# Patient Record
Sex: Female | Born: 1965 | Race: White | Hispanic: No | Marital: Married | Smoking: Former smoker
Health system: Southern US, Community
[De-identification: ages and names within clinical notes are randomized; demographics above are authoritative.]

## PROBLEM LIST (undated history)

## (undated) DIAGNOSIS — G43909 Migraine, unspecified, not intractable, without status migrainosus: Secondary | ICD-10-CM

## (undated) DIAGNOSIS — F329 Major depressive disorder, single episode, unspecified: Secondary | ICD-10-CM

## (undated) DIAGNOSIS — M519 Unspecified thoracic, thoracolumbar and lumbosacral intervertebral disc disorder: Secondary | ICD-10-CM

## (undated) DIAGNOSIS — I2510 Atherosclerotic heart disease of native coronary artery without angina pectoris: Secondary | ICD-10-CM

## (undated) DIAGNOSIS — K219 Gastro-esophageal reflux disease without esophagitis: Secondary | ICD-10-CM

## (undated) DIAGNOSIS — E782 Mixed hyperlipidemia: Secondary | ICD-10-CM

## (undated) DIAGNOSIS — F32A Depression, unspecified: Secondary | ICD-10-CM

## (undated) DIAGNOSIS — E119 Type 2 diabetes mellitus without complications: Secondary | ICD-10-CM

## (undated) HISTORY — PX: BREAST BIOPSY: SHX20

## (undated) HISTORY — PX: BREAST SURGERY: SHX581

## (undated) HISTORY — DX: Major depressive disorder, single episode, unspecified: F32.9

## (undated) HISTORY — DX: Mixed hyperlipidemia: E78.2

## (undated) HISTORY — DX: Unspecified thoracic, thoracolumbar and lumbosacral intervertebral disc disorder: M51.9

## (undated) HISTORY — PX: BACK SURGERY: SHX140

## (undated) HISTORY — PX: CARDIAC CATHETERIZATION: SHX172

## (undated) HISTORY — PX: EYE SURGERY: SHX253

## (undated) HISTORY — DX: Depression, unspecified: F32.A

## (undated) HISTORY — DX: Atherosclerotic heart disease of native coronary artery without angina pectoris: I25.10

## (undated) HISTORY — PX: CATARACT EXTRACTION: SUR2

## (undated) HISTORY — DX: Migraine, unspecified, not intractable, without status migrainosus: G43.909

---

## 1997-12-16 HISTORY — PX: CHOLECYSTECTOMY: SHX55

## 1998-12-16 HISTORY — PX: POSTERIOR LUMBAR FUSION 4 LEVEL: SHX6037

## 2010-12-16 HISTORY — PX: ABDOMINAL HYSTERECTOMY: SHX81

## 2010-12-19 HISTORY — PX: CATARACT EXTRACTION: SUR2

## 2013-12-16 HISTORY — PX: BREAST BIOPSY: SHX20

## 2018-06-24 ENCOUNTER — Encounter: Payer: Self-pay | Admitting: Internal Medicine

## 2018-06-24 ENCOUNTER — Other Ambulatory Visit: Payer: Self-pay

## 2018-06-24 ENCOUNTER — Ambulatory Visit (INDEPENDENT_AMBULATORY_CARE_PROVIDER_SITE_OTHER): Payer: Self-pay | Admitting: Internal Medicine

## 2018-06-24 VITALS — BP 108/60 | HR 81 | Ht 63.0 in | Wt 190.0 lb

## 2018-06-24 DIAGNOSIS — Z23 Encounter for immunization: Secondary | ICD-10-CM

## 2018-06-24 DIAGNOSIS — Z1231 Encounter for screening mammogram for malignant neoplasm of breast: Secondary | ICD-10-CM

## 2018-06-24 DIAGNOSIS — G8929 Other chronic pain: Secondary | ICD-10-CM

## 2018-06-24 DIAGNOSIS — G43009 Migraine without aura, not intractable, without status migrainosus: Secondary | ICD-10-CM | POA: Insufficient documentation

## 2018-06-24 DIAGNOSIS — Z1239 Encounter for other screening for malignant neoplasm of breast: Secondary | ICD-10-CM

## 2018-06-24 DIAGNOSIS — M545 Low back pain: Secondary | ICD-10-CM

## 2018-06-24 DIAGNOSIS — K219 Gastro-esophageal reflux disease without esophagitis: Secondary | ICD-10-CM | POA: Insufficient documentation

## 2018-06-24 MED ORDER — SUMATRIPTAN SUCCINATE 100 MG PO TABS: 100.0000 mg | ORAL_TABLET | ORAL | 5 refills | Status: DC | PRN

## 2018-06-24 MED ORDER — ZOSTER VAC RECOMB ADJUVANTED 50 MCG/0.5ML IM SUSR
0.5000 mL | Freq: Once | INTRAMUSCULAR | 1 refills | Status: AC
Start: 2018-06-24 — End: 2018-06-24

## 2018-06-24 MED ORDER — CYCLOBENZAPRINE HCL 10 MG PO TABS: 10.0000 mg | ORAL_TABLET | Freq: Three times a day (TID) | ORAL | 1 refills | Status: DC | PRN

## 2018-06-24 NOTE — Progress Notes (Signed)
Date:  06/24/2018   Name:  Nancy Skinner   DOB:  1966-03-22   MRN:  [id number]  New pt to establish care. Recently moved here from [place]. Referred by Lexine Baton.  Chief Complaint: New Patient (Initial Visit) Migraine   This is a recurrent problem. The problem occurs seasonly (much milder HA every 4-6 months). The pain is located in the bilateral and parietal region. The pain quality is similar to prior headaches. Associated symptoms include back pain. Pertinent negatives include no abdominal pain, dizziness, fever, numbness, tingling or weakness. She has tried triptans for the symptoms.  Gastroesophageal Reflux  She complains of heartburn. She reports no abdominal pain, no chest pain or no wheezing. This is a recurrent problem. The problem has been waxing and waning. The heartburn is located in the substernum. The heartburn is of mild intensity. Pertinent negatives include no fatigue. She has tried a PPI for the symptoms.  Back Pain  This is a chronic problem. The problem has been waxing and waning since onset. The pain is present in the lumbar spine. The quality of the pain is described as aching. Radiates to: sometimes to left hip. The pain is mild. Associated symptoms include headaches. Pertinent negatives include no abdominal pain, bladder incontinence, bowel incontinence, chest pain, fever, numbness, tingling or weakness. She has tried muscle relaxant and NSAIDs for the symptoms. The treatment provided significant relief.      Review of Systems  Constitutional: Negative for chills, fatigue and fever.  Respiratory: Negative for chest tightness, shortness of breath and wheezing.   Cardiovascular: Positive for leg swelling (mild ankle edema). Negative for chest pain and palpitations.  Gastrointestinal: Positive for heartburn. Negative for abdominal pain and bowel incontinence.  Genitourinary: Negative for bladder incontinence.  Musculoskeletal: Positive for back pain. Negative for  gait problem.  Skin: Negative for color change and rash.  Neurological: Positive for headaches. Negative for dizziness, tingling, weakness and numbness.  Psychiatric/Behavioral: Negative for dysphoric mood and sleep disturbance.    There are no active problems to display for this patient.   Prior to Admission medications   Medication Sig Start Date End Date Taking? Authorizing Provider  Esomeprazole Magnesium (NEXIUM PO) Take by mouth.   Yes [provider]  Multiple Vitamin (MULTIVITAMIN) capsule Take 1 capsule by mouth daily.   Yes [provider]    Allergies  Allergen Reactions  . Dye Fdc Red [Red Dye]     Past Surgical History:  Procedure Laterality Date  . ABDOMINAL HYSTERECTOMY    . BACK SURGERY    . BREAST SURGERY      Social History   Tobacco Use  . Smoking status: Current Every Day Smoker    Packs/day: 0.50    Types: Cigarettes  . Smokeless tobacco: Never Used  Substance Use Topics  . Alcohol use: Yes    Comment: less than occasion  . Drug use: Never     Medication list has been reviewed and updated.  Current Meds  Medication Sig  . Esomeprazole Magnesium (NEXIUM PO) Take by mouth.  . Multiple Vitamin (MULTIVITAMIN) capsule Take 1 capsule by mouth daily.    PHQ 2/9 Scores 06/24/2018  PHQ - 2 Score 0    Physical Exam  Constitutional: She is oriented to person, place, and time. She appears well-developed. No distress.  HENT:  Head: Normocephalic and atraumatic.  Neck: Normal range of motion. Neck supple.  Cardiovascular: Normal rate, regular rhythm and normal heart sounds.  Pulmonary/Chest: Effort normal  and breath sounds normal. No respiratory distress.  Musculoskeletal: Normal range of motion. She exhibits edema (tace ankle edema).       Lumbar back: She exhibits tenderness and spasm.  Neurological: She is alert and oriented to person, place, and time. She has normal strength and normal reflexes. No sensory deficit.  Skin: Skin  is warm and dry. No rash noted.  Psychiatric: She has a normal mood and affect. Her behavior is normal. Thought content normal.  Nursing note and vitals reviewed.   BP 108/60   Pulse 81   Ht 5\' 3"  (1.6 m)   Wt 130 lb (59 kg)   SpO2 98%   BMI 23.03 kg/m   Assessment and Plan: 1. Migraine without aura and without status migrainosus, not intractable imitrex PRN  2. Gastroesophageal reflux disease without esophagitis Continue over the counter PPI  3. Chronic midline low back pain without sciatica - cyclobenzaprine (FLEXERIL) 10 MG tablet; Take 1 tablet (10 mg total) by mouth 3 (three) times daily as needed for muscle spasms.  Dispense: 90 tablet; Refill: 1  4. Need for shingles vaccine - Zoster Vaccine Adjuvanted [facility](SHINGRIX) injection; Inject 0.5 mLs into the muscle once for 1 dose.  Dispense: 0.5 mL; Refill: 1 - SUMAtriptan (IMITREX) 100 MG tablet; Take 1 tablet (100 mg total) by mouth every 2 (two) hours as needed for migraine. May repeat in 2 hours if headache persists or recurs.  Dispense: 10 tablet; Refill: 5  5. Breast cancer screening - MM DIGITAL SCREENING BILATERAL; Future  HM - pt has had several colonoscopies - will sign records release.  Unsure of last Tdap - about 10 yrs ago. Will get release of mammograms and then [facility]  Meds ordered this encounter  Medications  . Zoster Vaccine Adjuvanted [facility](SHINGRIX) injection    Sig: Inject 0.5 mLs into the muscle once for 1 dose.    Dispense:  0.5 mL    Refill:  1  . SUMAtriptan (IMITREX) 100 MG tablet    Sig: Take 1 tablet (100 mg total) by mouth every 2 (two) hours as needed for migraine. May repeat in 2 hours if headache persists or recurs.    Dispense:  10 tablet    Refill:  5  . cyclobenzaprine (FLEXERIL) 10 MG tablet    Sig: Take 1 tablet (10 mg total) by mouth 3 (three) times daily as needed for muscle spasms.    Dispense:  90 tablet    Refill:  1    Partially dictated using Animal nutritionistDragon software. Any errors are  unintentional.  Bari EdwardLaura Ardena Gangl, MD [facility]  06/24/2018   There are no diagnoses linked to this encounter.

## 2018-06-26 LAB — LIPID PANEL
CHOLESTEROL: 187 (ref 0–200)
HDL: 29 — AB (ref 35–70)
TRIGLYCERIDES: 773 — AB (ref 40–160)

## 2018-06-26 LAB — HEPATIC FUNCTION PANEL
ALT: 18 (ref 7–35)
AST: 16 (ref 13–35)
Alkaline Phosphatase: 68 (ref 25–125)
Bilirubin, Total: 0.2

## 2018-06-26 LAB — HEMOGLOBIN A1C: HEMOGLOBIN A1C: 5.9

## 2018-06-26 LAB — BASIC METABOLIC PANEL
BUN: 10 (ref 4–21)
Creatinine: 0.7 (ref 0.5–1.1)
GLUCOSE: 98

## 2018-06-30 ENCOUNTER — Other Ambulatory Visit: Payer: Self-pay | Admitting: *Deleted

## 2018-06-30 ENCOUNTER — Inpatient Hospital Stay
Admission: RE | Admit: 2018-06-30 | Discharge: 2018-06-30 | Disposition: A | Discharge status: Home / Self Care | Payer: Self-pay | Source: Ambulatory Visit | Attending: *Deleted | Admitting: *Deleted

## 2018-06-30 DIAGNOSIS — Z9289 Personal history of other medical treatment: Secondary | ICD-10-CM

## 2018-07-06 ENCOUNTER — Ambulatory Visit
Admission: RE | Admit: 2018-07-06 | Discharge: 2018-07-06 | Disposition: A | Discharge status: Home / Self Care | Payer: Self-pay | Source: Ambulatory Visit | Attending: Internal Medicine | Admitting: Internal Medicine

## 2018-07-06 DIAGNOSIS — Z1231 Encounter for screening mammogram for malignant neoplasm of breast: Secondary | ICD-10-CM | POA: Insufficient documentation

## 2018-07-06 DIAGNOSIS — Z1239 Encounter for other screening for malignant neoplasm of breast: Secondary | ICD-10-CM

## 2018-07-15 ENCOUNTER — Ambulatory Visit (INDEPENDENT_AMBULATORY_CARE_PROVIDER_SITE_OTHER): Payer: Self-pay | Admitting: Internal Medicine

## 2018-07-15 ENCOUNTER — Encounter: Payer: Self-pay | Admitting: Internal Medicine

## 2018-07-15 VITALS — BP 124/78 | HR 90 | Ht 63.0 in | Wt 190.0 lb

## 2018-07-15 DIAGNOSIS — R079 Chest pain, unspecified: Secondary | ICD-10-CM

## 2018-07-15 DIAGNOSIS — E782 Mixed hyperlipidemia: Secondary | ICD-10-CM

## 2018-07-15 DIAGNOSIS — M62838 Other muscle spasm: Secondary | ICD-10-CM

## 2018-07-15 DIAGNOSIS — E1169 Type 2 diabetes mellitus with other specified complication: Secondary | ICD-10-CM | POA: Insufficient documentation

## 2018-07-15 DIAGNOSIS — F17201 Nicotine dependence, unspecified, in remission: Secondary | ICD-10-CM | POA: Insufficient documentation

## 2018-07-15 DIAGNOSIS — F17200 Nicotine dependence, unspecified, uncomplicated: Secondary | ICD-10-CM

## 2018-07-15 NOTE — Progress Notes (Signed)
Date:  07/15/2018   Name:  Nancy Skinner   DOB:  June 24, 1966   MRN:  [id number]   Chief Complaint: Chest Pain (On and off.) Chest Pain   This is a recurrent problem. Episode onset: more than 6 months. The onset quality is undetermined. The problem occurs intermittently (about once a month or so). The problem has been gradually worsening. The pain is present in the substernal region. The pain is mild. The quality of the pain is described as squeezing. The pain radiates to the left arm. Associated symptoms include back pain, headaches, numbness (in left hand) and shortness of breath. Pertinent negatives include no abdominal pain, dizziness, fever, irregular heartbeat, palpitations or weakness. The pain is aggravated by nothing. She has tried rest for the symptoms. Risk factors include oral contraceptive use.   Over the past 6 months had brief episodes of left anterior chest pain that occurs randomly.  Usually feels better if she presses the area and this is often followed by numbness and tingling in the fingers of her left hand.  All sx usually resolve in a short period of time. Last week she had a more severe episode while getting ready for a bike ride. She did not go to the ED.  It has not occurred as severely since then. She has labs from work.- renal fxn is normal but Triglycerides are very high. She continues smoke 1/2 ppd.   Review of Systems  Constitutional: Positive for fatigue. Negative for chills and fever.  Respiratory: Positive for shortness of breath. Negative for chest tightness.   Cardiovascular: Positive for chest pain. Negative for palpitations and leg swelling.  Gastrointestinal: Negative for abdominal pain, constipation and diarrhea.  Musculoskeletal: Positive for back pain and neck stiffness.  Skin: Negative for color change and rash.  Neurological: Positive for numbness (in left hand) and headaches. Negative for dizziness, tremors, weakness and light-headedness.    Psychiatric/Behavioral: Negative for sleep disturbance.    Patient Active Problem List   Diagnosis Date Noted  . Migraine without aura and without status migrainosus, not intractable 06/24/2018  . Gastroesophageal reflux disease without esophagitis 06/24/2018  . Chronic midline low back pain without sciatica 06/24/2018    Prior to Admission medications   Medication Sig Start Date End Date Taking? Authorizing Provider  Aspirin-Acetaminophen (GOODYS BODY PAIN PO) Take 1 Package by mouth daily.   Yes [provider]  cyclobenzaprine (FLEXERIL) 10 MG tablet Take 1 tablet (10 mg total) by mouth 3 (three) times daily as needed for muscle spasms. 06/24/18  Yes Reubin Milan, MD  Esomeprazole Magnesium (NEXIUM PO) Take by mouth.   Yes [provider]  Lidocaine 4 % PTCH Apply topically.   Yes [provider]  Multiple Vitamin (MULTIVITAMIN) capsule Take 1 capsule by mouth daily.   Yes [provider]  SUMAtriptan (IMITREX) 100 MG tablet Take 1 tablet (100 mg total) by mouth every 2 (two) hours as needed for migraine. May repeat in 2 hours if headache persists or recurs. 06/24/18  Yes Reubin Milan, MD    Allergies  Allergen Reactions  . Dye Fdc Red [Red Dye]     Past Surgical History:  Procedure Laterality Date  . ABDOMINAL HYSTERECTOMY  2012   partial for bleeding  . BREAST BIOPSY Right    neg  . BREAST BIOPSY Left 2015   4 bxs- neg  . BREAST SURGERY Left    multiple biopsies  . CATARACT EXTRACTION Right 12/19/2010  . CHOLECYSTECTOMY  1999  . POSTERIOR LUMBAR FUSION 4 LEVEL  2000    Social History   Tobacco Use  . Smoking status: Current Every Day Smoker    Packs/day: 0.50    Types: Cigarettes  . Smokeless tobacco: Never Used  Substance Use Topics  . Alcohol use: Yes    Comment: less than occasion  . Drug use: Never     Medication list has been reviewed and updated.  Current Meds  Medication Sig  . Aspirin-Acetaminophen  (GOODYS BODY PAIN PO) Take 1 Package by mouth daily.  . cyclobenzaprine (FLEXERIL) 10 MG tablet Take 1 tablet (10 mg total) by mouth 3 (three) times daily as needed for muscle spasms.  . Esomeprazole Magnesium (NEXIUM PO) Take by mouth.  . Lidocaine 4 % PTCH Apply topically.  . Multiple Vitamin (MULTIVITAMIN) capsule Take 1 capsule by mouth daily.  . SUMAtriptan (IMITREX) 100 MG tablet Take 1 tablet (100 mg total) by mouth every 2 (two) hours as needed for migraine. May repeat in 2 hours if headache persists or recurs.    PHQ 2/9 Scores 06/24/2018  PHQ - 2 Score 0    Physical Exam  Constitutional: She is oriented to person, place, and time. She appears well-developed and well-nourished. No distress.  HENT:  Head: Normocephalic and atraumatic.  Cardiovascular: Normal rate, regular rhythm and normal pulses. Exam reveals no S3 and no S4.  Pulmonary/Chest: Effort normal. No respiratory distress. She has no decreased breath sounds. She has no wheezes.  Abdominal: Soft. Normal appearance. There is no tenderness.  Musculoskeletal: She exhibits no edema.       Cervical back: She exhibits decreased range of motion and spasm (of the left trapezius muscle).  Neurological: She is alert and oriented to person, place, and time.  Skin: Skin is warm and dry. No rash noted.  Psychiatric: She has a normal mood and affect. Her behavior is normal. Thought content normal.  Nursing note and vitals reviewed.   BP 124/78   Pulse 90   Ht 5\' 3"  (1.6 m)   Wt 190 lb (86.2 kg)   SpO2 96%   BMI 33.66 kg/m   Assessment and Plan: 1. Chest pain, unspecified type Concerning for cardiac etiology If severe sx again - go to ED, discussed with significant other, Lexine Batonodd Vivar - EKG 12-Lead - Ambulatory referral to Cardiology  2. Tobacco use disorder Work on cutting back - Ambulatory referral to Cardiology  3. Mixed hyperlipidemia Will need medication - Ambulatory referral to Cardiology  4. Neck muscle  spasm Continue massage Likely the cause of her hand tingling   No orders of the defined types were placed in this encounter.   Partially dictated using Animal nutritionistDragon software. Any errors are unintentional.  Bari EdwardLaura Uzziel Russey, MD [facility]  07/15/2018

## 2018-07-16 ENCOUNTER — Encounter: Payer: Self-pay | Admitting: Internal Medicine

## 2018-07-16 ENCOUNTER — Other Ambulatory Visit
Admission: RE | Admit: 2018-07-16 | Discharge: 2018-07-16 | Disposition: A | Discharge status: Home / Self Care | Payer: Self-pay | Source: Ambulatory Visit | Attending: Internal Medicine | Admitting: Internal Medicine

## 2018-07-16 ENCOUNTER — Ambulatory Visit: Payer: Self-pay | Admitting: Internal Medicine

## 2018-07-16 VITALS — BP 132/80 | HR 72 | Ht 63.0 in | Wt 188.2 lb

## 2018-07-16 DIAGNOSIS — I200 Unstable angina: Secondary | ICD-10-CM

## 2018-07-16 DIAGNOSIS — Z01810 Encounter for preprocedural cardiovascular examination: Secondary | ICD-10-CM | POA: Insufficient documentation

## 2018-07-16 DIAGNOSIS — I208 Other forms of angina pectoris: Secondary | ICD-10-CM | POA: Insufficient documentation

## 2018-07-16 DIAGNOSIS — R61 Generalized hyperhidrosis: Secondary | ICD-10-CM

## 2018-07-16 DIAGNOSIS — E785 Hyperlipidemia, unspecified: Secondary | ICD-10-CM

## 2018-07-16 LAB — CBC WITH DIFFERENTIAL/PLATELET
BASOS ABS: 0.2 10*3/uL — AB (ref 0–0.1)
BASOS PCT: 1 %
EOS ABS: 0.4 10*3/uL (ref 0–0.7)
Eosinophils Relative: 2 %
HCT: 39.7 % (ref 35.0–47.0)
HEMOGLOBIN: 14.1 g/dL (ref 12.0–16.0)
LYMPHS ABS: 6.4 10*3/uL — AB (ref 1.0–3.6)
Lymphocytes Relative: 38 %
MCH: 29.9 pg (ref 26.0–34.0)
MCHC: 35.4 g/dL (ref 32.0–36.0)
MCV: 84.3 fL (ref 80.0–100.0)
Monocytes Absolute: 0.8 10*3/uL (ref 0.2–0.9)
Monocytes Relative: 5 %
NEUTROS PCT: 54 %
Neutro Abs: 8.9 10*3/uL — ABNORMAL HIGH (ref 1.4–6.5)
Platelets: 357 10*3/uL (ref 150–440)
RBC: 4.71 MIL/uL (ref 3.80–5.20)
RDW: 14.4 % (ref 11.5–14.5)
WBC: 16.8 10*3/uL — AB (ref 3.6–11.0)

## 2018-07-16 LAB — BASIC METABOLIC PANEL
ANION GAP: 9 (ref 5–15)
BUN: 13 mg/dL (ref 6–20)
CHLORIDE: 102 mmol/L (ref 98–111)
CO2: 27 mmol/L (ref 22–32)
Calcium: 9.5 mg/dL (ref 8.9–10.3)
Creatinine, Ser: 0.71 mg/dL (ref 0.44–1.00)
GFR calc non Af Amer: >60 mL/min (ref >60)
Glucose, Bld: 103 mg/dL — ABNORMAL HIGH (ref 70–99)
POTASSIUM: 4 mmol/L (ref 3.5–5.1)
SODIUM: 138 mmol/L (ref 135–145)

## 2018-07-16 MED ORDER — NITROGLYCERIN 0.4 MG SL SUBL: 0.4000 mg | SUBLINGUAL_TABLET | SUBLINGUAL | 4 refills | Status: AC | PRN

## 2018-07-16 MED ORDER — METOPROLOL SUCCINATE ER 25 MG PO TB24: 12.5000 mg | ORAL_TABLET | Freq: Every day | ORAL | 3 refills | Status: DC

## 2018-07-16 NOTE — Patient Instructions (Addendum)
Medication Instructions:  Your physician has recommended you make the following change in your medication:  1- START Metoprolol succinate 12.5 mg (0.5 tablet) by mouth once a day. 2- Nitroglycerin as needed for chest pain. Dissolve I 1 tablet under the tongue every 5 minutes for a maximum of 3 doses. If pain does not go away then call 911 or go to emergency room.   Labwork: Your physician recommends that you return for lab work in: TODAY or tomorrow. (BMET, CBC). Please go to the [facility]. You will check in at the front desk to the right as you walk into the atrium. Valet Parking is offered if needed.    Testing/Procedures: [facility] Cardiac Cath Instructions   You are scheduled for a Cardiac Cath on:__08/7/19_______  Please arrive at __08:30_am on the day of your procedure  Please expect a call from our [facility] to pre-register you  Do not eat/drink anything after midnight  Someone will need to drive you home  It is recommended someone be with you for the first 24 hours after your procedure  Wear clothes that are easy to get on/off and wear slip on shoes if possible   Medications bring a current list of all medications with you  You may take all of your medications the morning of your procedure with enough water to swallow safely   Day of your procedure: Arrive at the Medical Mall entrance.  Free valet service is available.  After entering the Medical Mall please check-in at the registration desk (1st desk on your right) to receive your armband. After receiving your armband someone will escort you to the cardiac cath/special procedures waiting area.  The usual length of stay after your procedure is about 2 to 3 hours.  This can vary.  If you have any questions, please call our office at [phone], or you may call the cardiac cath lab at [facility] directly at [phone]   Follow-Up: Your physician recommends that you schedule a follow-up  appointment in: 3-4 WEEKS WITH DR END OR APP.   If you need a refill on your cardiac medications before your next appointment, please call your pharmacy.    Coronary Angiogram With Stent Coronary angiogram with stent placement is a procedure to widen or open a narrow blood vessel of the heart (coronary artery). Arteries may become blocked by cholesterol buildup (plaques) in the lining of the wall. When a coronary artery becomes partially blocked, blood flow to that area decreases. This may lead to chest pain or a heart attack (myocardial infarction). A stent is a small piece of metal that looks like mesh or a spring. Stent placement may be done as treatment for a heart attack or right after a coronary angiogram in which a blocked artery is found. Let your health care provider know about:  Any allergies you have.  All medicines you are taking, including vitamins, herbs, eye drops, creams, and over-the-counter medicines.  Any problems you or family members have had with anesthetic medicines.  Any blood disorders you have.  Any surgeries you have had.  Any medical conditions you have.  Whether you are pregnant or may be pregnant. What are the risks? Generally, this is a safe procedure. However, problems may occur, including:  Damage to the heart or its blood vessels.  A return of blockage.  Bleeding, infection, or bruising at the insertion site.  A collection of blood under the skin (hematoma) at the insertion site.  A blood clot  in another part of the body.  Kidney injury.  Allergic reaction to the dye or contrast that is used.  Bleeding into the abdomen (retroperitoneal bleeding).  What happens before the procedure? Staying hydrated Follow instructions from your health care provider about hydration, which may include:  Up to 2 hours before the procedure - you may continue to drink clear liquids, such as water, clear fruit juice, black coffee, and plain tea.  Eating  and drinking restrictions Follow instructions from your health care provider about eating and drinking, which may include:  8 hours before the procedure - stop eating heavy meals or foods such as meat, fried foods, or fatty foods.  6 hours before the procedure - stop eating light meals or foods, such as toast or cereal.  2 hours before the procedure - stop drinking clear liquids.  Ask your health care provider about:  Changing or stopping your regular medicines. This is especially important if you are taking diabetes medicines or blood thinners.  Taking medicines such as ibuprofen. These medicines can thin your blood. Do not take these medicines before your procedure if your health care provider instructs you not to. Generally, aspirin is recommended before a procedure of passing a small, thin tube (catheter) through a blood vessel and into the heart (cardiac catheterization).  What happens during the procedure?  An IV tube will be inserted into one of your veins.  You will be given one or more of the following: ? A medicine to help you relax (sedative). ? A medicine to numb the area where the catheter will be inserted into an artery (local anesthetic).  To reduce your risk of infection: ? Your health care team will wash or sanitize their hands. ? Your skin will be washed with soap. ? Hair may be removed from the area where the catheter will be inserted.  Using a guide wire, the catheter will be inserted into an artery. The location may be in your groin, in your wrist, or in the fold of your arm (near your elbow).  A type of X-ray (fluoroscopy) will be used to help guide the catheter to the opening of the arteries in the heart.  A dye will be injected into the catheter, and X-rays will be taken. The dye will help to show where any narrowing or blockages are located in the arteries.  A tiny wire will be guided to the blocked spot, and a balloon will be inflated to make the artery  wider.  The stent will be expanded and will crush the plaques into the wall of the vessel. The stent will hold the area open and improve the blood flow. Most stents have a drug coating to reduce the risk of the stent narrowing over time.  The artery may be made wider using a drill, laser, or other tools to remove plaques.  When the blood flow is better, the catheter will be removed. The lining of the artery will grow over the stent, which stays where it was placed. This procedure may vary among health care providers and hospitals. What happens after the procedure?  If the procedure is done through the leg, you will be kept in bed lying flat for about 6 hours. You will be instructed to not bend and not cross your legs.  The insertion site will be checked frequently.  The pulse in your foot or wrist will be checked frequently.  You may have additional blood tests, X-rays, and a test that records the  electrical activity of your heart (electrocardiogram, or ECG). This information is not intended to replace advice given to you by your health care provider. Make sure you discuss any questions you have with your health care provider. Document Released: 06/08/2003 Document Revised: 08/01/2016 Document Reviewed: 07/07/2016 Elsevier Interactive Patient Education  [employer].

## 2018-07-16 NOTE — H&P (View-Only) (Signed)
 New Outpatient Visit Date: 07/16/2018  Referring Provider: Berglund, Laura H, MD [address] [place], [place] [address]  Chief Complaint: Chest pain  HPI:  Nancy Skinner is a 52 y.o. female who is being seen today for the evaluation of chest pain at the request of Dr. Berglund. She has a history of hyperlipidemia, depression, migraine headaches, and lumbar disc disease.  Over the last 5 months, Nancy Skinner has experienced intermittent stabbing substernal chest pain that radiates to the left shoulder and down the left arm.  She then also develops tingling in her hand.  The chest pain is associated with shortness of breath and palpitations.  The episodes come on somewhat randomly, though always while she is doing an activity.  She has not had any symptoms at rest.  Episodes typically last 30 to 60 seconds, though she had a severe one on Friday (6 days ago) that lasted almost 2 minutes and was more intense than usual.  Maximal severity has been 7/10.  Nancy Skinner denies a history of prior cardiac disease or testing.  Aside from the intermittent episodes of chest pain noted above, Nancy Skinner has not had any shortness of breath nor palpitations, lightheadedness, orthopnea, PND, or edema.  She has noted drenching night sweats over the last few weeks.  She has not had any fevers or chills during the day.  A few weeks ago, she became quite nauseated and dizzy while riding her motorcycle.  She did not fall or pass out but needed to stop for a few minutes until the dizziness resolved.  --------------------------------------------------------------------------------------------------  Cardiovascular History & Procedures: Cardiovascular Problems:  Chest pain  Risk Factors:  Hyperlipidemia, tobacco use, and family history  Cath/PCI:  None  CV Surgery:  None  EP Procedures and Devices:  None  Non-Invasive Evaluation(s):  None  Recent CV Pertinent Labs: Lab Results  Component Value  Date   CHOL 187 06/26/2018   HDL 29 (A) 06/26/2018   TRIG 773 (A) 06/26/2018   BUN 10 06/26/2018   CREATININE 0.7 06/26/2018    --------------------------------------------------------------------------------------------------  Past Medical History:  Diagnosis Date  . Depression   . Migraines     Past Surgical History:  Procedure Laterality Date  . ABDOMINAL HYSTERECTOMY  2012   partial for bleeding  . BREAST BIOPSY Right    neg  . BREAST BIOPSY Left 2015   4 bxs- neg  . BREAST SURGERY Left    multiple biopsies  . CATARACT EXTRACTION Right 12/19/2010  . CHOLECYSTECTOMY  1999  . POSTERIOR LUMBAR FUSION 4 LEVEL  2000    Current Meds  Medication Sig  . Aspirin-Acetaminophen (GOODYS BODY PAIN PO) Take 1 Package by mouth daily.  . cyclobenzaprine (FLEXERIL) 10 MG tablet Take 1 tablet (10 mg total) by mouth 3 (three) times daily as needed for muscle spasms.  . Esomeprazole Magnesium (NEXIUM PO) Take by mouth.  . Lidocaine 4 % PTCH Apply topically.  . Multiple Vitamin (MULTIVITAMIN) capsule Take 1 capsule by mouth daily.  . SUMAtriptan (IMITREX) 100 MG tablet Take 1 tablet (100 mg total) by mouth every 2 (two) hours as needed for migraine. May repeat in 2 hours if headache persists or recurs.    Allergies: Dye fdc red [red dye]  Social History   Tobacco Use  . Smoking status: Current Every Day Smoker    Packs/day: 0.50    Years: 40.00    Pack years: 20.00    Types: Cigarettes  . Smokeless tobacco: Never   Used  Substance Use Topics  . Alcohol use: Yes    Comment: less than occasion  . Drug use: Never    Family History  Problem Relation Age of Onset  . Breast cancer Mother 50  . Breast cancer Maternal Grandmother   . Breast cancer Paternal Grandmother     Review of Systems: A 12-system review of systems was performed and was negative except as noted in the  HPI.  --------------------------------------------------------------------------------------------------  Physical Exam: BP 132/80 (BP Location: Right Arm, Patient Position: Sitting, Cuff Size: Normal)   Pulse 72   Ht 5' 3" (1.6 m)   Wt 188 lb 4 oz (85.4 kg)   BMI 33.35 kg/m   General: NAD. HEENT: No conjunctival pallor or scleral icterus. Moist mucous membranes. OP clear. Neck: Supple without lymphadenopathy, thyromegaly, JVD, or HJR. No carotid bruit. Lungs: Normal work of breathing. Clear to auscultation bilaterally without wheezes or crackles. Heart: Regular rate and rhythm without murmurs, rubs, or gallops. Non-displaced PMI. Abd: Bowel sounds present. Soft, NT/ND without hepatosplenomegaly Ext: No lower extremity edema. Radial, PT, and DP pulses are 2+ bilaterally Skin: Warm and dry without rash. Neuro: CNIII-XII intact. Strength and fine-touch sensation intact in upper and lower extremities bilaterally. Psych: Normal mood and affect.  EKG: Normal sinus rhythm without abnormalities  No results found for: WBC, HGB, HCT, MCV, PLT  Lab Results  Component Value Date   BUN 10 06/26/2018   CREATININE 0.7 06/26/2018   ALT 18 06/26/2018    Lab Results  Component Value Date   CHOL 187 06/26/2018   HDL 29 (A) 06/26/2018   TRIG 773 (A) 06/26/2018     --------------------------------------------------------------------------------------------------  ASSESSMENT AND PLAN: Accelerating angina Chest pain has both typical and atypical features.  It is exertional, albeit occurring even with minimal activity such as ending in the kitchen.  Pain typically lasts only 30 to 60 seconds.  However, it is becoming more frequent and severe with accompanying shortness of breath and palpitations.  She has some cardiac risk factors including hyperlipidemia, tobacco use, and a family history.  We have discussed invasive and noninvasive evaluation options and have agreed to proceed directly to  cardiac catheterization.  I have reviewed the risks, indications, and alternatives to cardiac catheterization, possible angioplasty, and stenting with the patient. Risks include but are not limited to bleeding, infection, vascular injury, stroke, myocardial infection, arrhythmia, kidney injury, radiation-related injury in the case of prolonged fluoroscopy use, emergency cardiac surgery, and death. The patient understands the risks of serious complication is 1-2 in 1000 with diagnostic cardiac cath and 1-2% or less with angioplasty/stenting.  We will obtain routine precatheterization labs today.  In the meantime, I have asked her to continue taking aspirin on a regular basis.  If she does not take Goody's powder daily, she should begin taking aspirin 81 mg daily.  I will start her on metoprolol succinate 12.5 mg daily for antianginal therapy and also provide her with a prescription for sublingual nitroglycerin.  If chest pain worsens before upcoming catheterization, Nancy Skinner was advised to seek immediate medical attention.  Dyslipidemia Low HDL noted as well as markedly elevated triglycerides.  Nancy Skinner is asymptomatic and denies a history of pancreatitis.  I think that she may benefit from a fibroid and/or fish oil.  I will defer adding a medication until after catheterization, in case concurrent statin therapy is necessary.  Night sweats Nonspecific.  CBC will be checked as part of precatheterization labs.  I will defer further   work-up to Dr. Berglund.  Tobacco use Smoking cessation advised.  Follow-up: Return to clinic in 3 to 4 weeks.   Nyron Mozer, MD 07/16/2018 4:51 PM  

## 2018-07-16 NOTE — Progress Notes (Signed)
New Outpatient Visit Date: 07/16/2018  Referring Provider: Reubin MilanBerglund, Laura H, MD [address] [place], [place] [address]  Chief Complaint: Chest pain  HPI:  [name] is a 52 y.o. female who is being seen today for the evaluation of chest pain at the request of Dr. Judithann GravesBerglund. She has a history of hyperlipidemia, depression, migraine headaches, and lumbar disc disease.  Over the last 5 months, [name] has experienced intermittent stabbing substernal chest pain that radiates to the left shoulder and down the left arm.  She then also develops tingling in her hand.  The chest pain is associated with shortness of breath and palpitations.  The episodes come on somewhat randomly, though always while she is doing an activity.  She has not had any symptoms at rest.  Episodes typically last 30 to 60 seconds, though she had a severe one on Friday (6 days ago) that lasted almost 2 minutes and was more intense than usual.  Maximal severity has been 7/10.  [name] denies a history of prior cardiac disease or testing.  Aside from the intermittent episodes of chest pain noted above, [name] has not had any shortness of breath nor palpitations, lightheadedness, orthopnea, PND, or edema.  She has noted drenching night sweats over the last few weeks.  She has not had any fevers or chills during the day.  A few weeks ago, she became quite nauseated and dizzy while riding her motorcycle.  She did not fall or pass out but needed to stop for a few minutes until the dizziness resolved.  --------------------------------------------------------------------------------------------------  Cardiovascular History & Procedures: Cardiovascular Problems:  Chest pain  Risk Factors:  Hyperlipidemia, tobacco use, and family history  Cath/PCI:  None  CV Surgery:  None  EP Procedures and Devices:  None  Non-Invasive Evaluation(s):  None  Recent CV Pertinent Labs: Lab Results  Component Value  Date   CHOL 187 06/26/2018   HDL 29 (A) 06/26/2018   TRIG 773 (A) 06/26/2018   BUN 10 06/26/2018   CREATININE 0.7 06/26/2018    --------------------------------------------------------------------------------------------------  Past Medical History:  Diagnosis Date  . Depression   . Migraines     Past Surgical History:  Procedure Laterality Date  . ABDOMINAL HYSTERECTOMY  2012   partial for bleeding  . BREAST BIOPSY Right    neg  . BREAST BIOPSY Left 2015   4 bxs- neg  . BREAST SURGERY Left    multiple biopsies  . CATARACT EXTRACTION Right 12/19/2010  . CHOLECYSTECTOMY  1999  . POSTERIOR LUMBAR FUSION 4 LEVEL  2000    Current Meds  Medication Sig  . Aspirin-Acetaminophen (GOODYS BODY PAIN PO) Take 1 Package by mouth daily.  . cyclobenzaprine (FLEXERIL) 10 MG tablet Take 1 tablet (10 mg total) by mouth 3 (three) times daily as needed for muscle spasms.  . Esomeprazole Magnesium (NEXIUM PO) Take by mouth.  . Lidocaine 4 % PTCH Apply topically.  . Multiple Vitamin (MULTIVITAMIN) capsule Take 1 capsule by mouth daily.  . SUMAtriptan (IMITREX) 100 MG tablet Take 1 tablet (100 mg total) by mouth every 2 (two) hours as needed for migraine. May repeat in 2 hours if headache persists or recurs.    Allergies: Dye fdc red [red dye]  Social History   Tobacco Use  . Smoking status: Current Every Day Smoker    Packs/day: 0.50    Years: 40.00    Pack years: 20.00    Types: Cigarettes  . Smokeless tobacco: Never  Used  Substance Use Topics  . Alcohol use: Yes    Comment: less than occasion  . Drug use: Never    Family History  Problem Relation Age of Onset  . Breast cancer Mother 66  . Breast cancer Maternal Grandmother   . Breast cancer Paternal Grandmother     Review of Systems: A 12-system review of systems was performed and was negative except as noted in the  HPI.  --------------------------------------------------------------------------------------------------  Physical Exam: BP 132/80 (BP Location: Right Arm, Patient Position: Sitting, Cuff Size: Normal)   Pulse 72   Ht 5\' 3"  (1.6 m)   Wt 188 lb 4 oz (85.4 kg)   BMI 33.35 kg/m   General: NAD. HEENT: No conjunctival pallor or scleral icterus. Moist mucous membranes. OP clear. Neck: Supple without lymphadenopathy, thyromegaly, JVD, or HJR. No carotid bruit. Lungs: Normal work of breathing. Clear to auscultation bilaterally without wheezes or crackles. Heart: Regular rate and rhythm without murmurs, rubs, or gallops. Non-displaced PMI. Abd: Bowel sounds present. Soft, NT/ND without hepatosplenomegaly Ext: No lower extremity edema. Radial, PT, and DP pulses are 2+ bilaterally Skin: Warm and dry without rash. Neuro: CNIII-XII intact. Strength and fine-touch sensation intact in upper and lower extremities bilaterally. Psych: Normal mood and affect.  EKG: Normal sinus rhythm without abnormalities  No results found for: WBC, HGB, HCT, MCV, PLT  Lab Results  Component Value Date   BUN 10 06/26/2018   CREATININE 0.7 06/26/2018   ALT 18 06/26/2018    Lab Results  Component Value Date   CHOL 187 06/26/2018   HDL 29 (A) 06/26/2018   TRIG 773 (A) 06/26/2018     --------------------------------------------------------------------------------------------------  ASSESSMENT AND PLAN: Accelerating angina Chest pain has both typical and atypical features.  It is exertional, albeit occurring even with minimal activity such as ending in the kitchen.  Pain typically lasts only 30 to 60 seconds.  However, it is becoming more frequent and severe with accompanying shortness of breath and palpitations.  She has some cardiac risk factors including hyperlipidemia, tobacco use, and a family history.  We have discussed invasive and noninvasive evaluation options and have agreed to proceed directly to  cardiac catheterization.  I have reviewed the risks, indications, and alternatives to cardiac catheterization, possible angioplasty, and stenting with the patient. Risks include but are not limited to bleeding, infection, vascular injury, stroke, myocardial infection, arrhythmia, kidney injury, radiation-related injury in the case of prolonged fluoroscopy use, emergency cardiac surgery, and death. The patient understands the risks of serious complication is 1-2 in 1000 with diagnostic cardiac cath and 1-2% or less with angioplasty/stenting.  We will obtain routine precatheterization labs today.  In the meantime, I have asked her to continue taking aspirin on a regular basis.  If she does not take Goody's powder daily, she should begin taking aspirin 81 mg daily.  I will start her on metoprolol succinate 12.5 mg daily for antianginal therapy and also provide her with a prescription for sublingual nitroglycerin.  If chest pain worsens before upcoming catheterization, Ms. Nancy Rud was advised to seek immediate medical attention.  Dyslipidemia Low HDL noted as well as markedly elevated triglycerides.  Ms. Nancy Rud is asymptomatic and denies a history of pancreatitis.  I think that she may benefit from a fibroid and/or fish oil.  I will defer adding a medication until after catheterization, in case concurrent statin therapy is necessary.  Night sweats Nonspecific.  CBC will be checked as part of precatheterization labs.  I will defer further  work-up to Dr. Judithann Graves.  Tobacco use Smoking cessation advised.  Follow-up: Return to clinic in 3 to 4 weeks.   Yvonne Kendall, MD 07/16/2018 4:51 PM

## 2018-07-17 ENCOUNTER — Other Ambulatory Visit: Payer: Self-pay | Admitting: Internal Medicine

## 2018-07-17 DIAGNOSIS — R61 Generalized hyperhidrosis: Secondary | ICD-10-CM

## 2018-07-17 NOTE — Progress Notes (Signed)
Spoke with patient and she is working today. Will go Monday for CXR.

## 2018-07-18 ENCOUNTER — Ambulatory Visit: Admission: RE | Admit: 2018-07-18 | Payer: Self-pay | Source: Ambulatory Visit

## 2018-07-18 ENCOUNTER — Inpatient Hospital Stay: Admission: RE | Admit: 2018-07-18 | Payer: Self-pay | Source: Ambulatory Visit

## 2018-07-20 ENCOUNTER — Other Ambulatory Visit: Payer: Self-pay | Admitting: Internal Medicine

## 2018-07-20 ENCOUNTER — Ambulatory Visit
Admission: RE | Admit: 2018-07-20 | Discharge: 2018-07-20 | Disposition: A | Discharge status: Home / Self Care | Payer: Self-pay | Source: Ambulatory Visit | Attending: Internal Medicine | Admitting: Internal Medicine

## 2018-07-20 DIAGNOSIS — R61 Generalized hyperhidrosis: Secondary | ICD-10-CM | POA: Insufficient documentation

## 2018-07-20 DIAGNOSIS — I200 Unstable angina: Secondary | ICD-10-CM

## 2018-07-20 DIAGNOSIS — R911 Solitary pulmonary nodule: Secondary | ICD-10-CM | POA: Insufficient documentation

## 2018-07-21 ENCOUNTER — Other Ambulatory Visit: Payer: Self-pay | Admitting: Internal Medicine

## 2018-07-21 DIAGNOSIS — R911 Solitary pulmonary nodule: Secondary | ICD-10-CM

## 2018-07-22 ENCOUNTER — Encounter: Payer: Self-pay | Admitting: *Deleted

## 2018-07-22 ENCOUNTER — Ambulatory Visit
Admission: RE | Admit: 2018-07-22 | Discharge: 2018-07-22 | Disposition: A | Discharge status: Home / Self Care | Payer: Self-pay | Source: Ambulatory Visit | Attending: Internal Medicine | Admitting: Internal Medicine

## 2018-07-22 ENCOUNTER — Encounter
Admission: RE | Disposition: A | Discharge status: Home / Self Care | Payer: Self-pay | Source: Ambulatory Visit | Attending: Internal Medicine

## 2018-07-22 DIAGNOSIS — Z9841 Cataract extraction status, right eye: Secondary | ICD-10-CM | POA: Insufficient documentation

## 2018-07-22 DIAGNOSIS — Z9889 Other specified postprocedural states: Secondary | ICD-10-CM | POA: Insufficient documentation

## 2018-07-22 DIAGNOSIS — I200 Unstable angina: Secondary | ICD-10-CM

## 2018-07-22 DIAGNOSIS — I25110 Atherosclerotic heart disease of native coronary artery with unstable angina pectoris: Secondary | ICD-10-CM

## 2018-07-22 DIAGNOSIS — Z7982 Long term (current) use of aspirin: Secondary | ICD-10-CM | POA: Insufficient documentation

## 2018-07-22 DIAGNOSIS — G43909 Migraine, unspecified, not intractable, without status migrainosus: Secondary | ICD-10-CM | POA: Insufficient documentation

## 2018-07-22 DIAGNOSIS — R61 Generalized hyperhidrosis: Secondary | ICD-10-CM | POA: Insufficient documentation

## 2018-07-22 DIAGNOSIS — Z79899 Other long term (current) drug therapy: Secondary | ICD-10-CM | POA: Insufficient documentation

## 2018-07-22 DIAGNOSIS — E781 Pure hyperglyceridemia: Secondary | ICD-10-CM | POA: Insufficient documentation

## 2018-07-22 DIAGNOSIS — F17210 Nicotine dependence, cigarettes, uncomplicated: Secondary | ICD-10-CM | POA: Insufficient documentation

## 2018-07-22 DIAGNOSIS — Z9049 Acquired absence of other specified parts of digestive tract: Secondary | ICD-10-CM | POA: Insufficient documentation

## 2018-07-22 DIAGNOSIS — R079 Chest pain, unspecified: Secondary | ICD-10-CM | POA: Diagnosis present

## 2018-07-22 DIAGNOSIS — Z90711 Acquired absence of uterus with remaining cervical stump: Secondary | ICD-10-CM | POA: Insufficient documentation

## 2018-07-22 DIAGNOSIS — Z981 Arthrodesis status: Secondary | ICD-10-CM | POA: Insufficient documentation

## 2018-07-22 HISTORY — PX: INTRAVASCULAR PRESSURE WIRE/FFR STUDY: CATH118243

## 2018-07-22 HISTORY — PX: LEFT HEART CATH AND CORONARY ANGIOGRAPHY: CATH118249

## 2018-07-22 SURGERY — LEFT HEART CATH AND CORONARY ANGIOGRAPHY
Anesthesia: Moderate Sedation

## 2018-07-22 MED ORDER — SODIUM CHLORIDE 0.9 % WEIGHT BASED INFUSION
3.0000 mL/kg/h | INTRAVENOUS | Status: DC
  Administered 2018-07-22: 3 mL/kg/h via INTRAVENOUS

## 2018-07-22 MED ORDER — VERAPAMIL HCL 2.5 MG/ML IV SOLN
INTRAVENOUS | Status: DC | PRN
  Administered 2018-07-22 (×2): 2.5 mg via INTRA_ARTERIAL

## 2018-07-22 MED ORDER — SODIUM CHLORIDE 0.9% FLUSH: 3.0000 mL | Freq: Two times a day (BID) | INTRAVENOUS | Status: DC

## 2018-07-22 MED ORDER — NITROGLYCERIN 1 MG/10 ML FOR IR/CATH LAB
INTRA_ARTERIAL | Status: DC | PRN
  Administered 2018-07-22: 200 ug via INTRACORONARY

## 2018-07-22 MED ORDER — HEPARIN SODIUM (PORCINE) 1000 UNIT/ML IJ SOLN
INTRAMUSCULAR | Status: DC | PRN
  Administered 2018-07-22: 2000 [IU] via INTRAVENOUS
  Administered 2018-07-22 (×2): 4000 [IU] via INTRAVENOUS

## 2018-07-22 MED ORDER — NITROGLYCERIN 5 MG/ML IV SOLN
INTRAVENOUS | Status: AC
  Filled 2018-07-22: qty 10

## 2018-07-22 MED ORDER — MIDAZOLAM HCL 2 MG/2ML IJ SOLN
INTRAMUSCULAR | Status: AC
  Filled 2018-07-22: qty 2

## 2018-07-22 MED ORDER — FENTANYL CITRATE (PF) 100 MCG/2ML IJ SOLN
INTRAMUSCULAR | Status: DC | PRN
  Administered 2018-07-22: 50 ug via INTRAVENOUS
  Administered 2018-07-22: 25 ug via INTRAVENOUS

## 2018-07-22 MED ORDER — VERAPAMIL HCL 2.5 MG/ML IV SOLN
INTRAVENOUS | Status: AC
  Filled 2018-07-22: qty 2

## 2018-07-22 MED ORDER — HYDRALAZINE HCL 20 MG/ML IJ SOLN: 5.0000 mg | INTRAMUSCULAR | Status: DC | PRN

## 2018-07-22 MED ORDER — LIDOCAINE HCL (PF) 1 % IJ SOLN
INTRAMUSCULAR | Status: AC
  Filled 2018-07-22: qty 30

## 2018-07-22 MED ORDER — SODIUM CHLORIDE 0.9% FLUSH: 3.0000 mL | INTRAVENOUS | Status: DC | PRN

## 2018-07-22 MED ORDER — ACETAMINOPHEN 325 MG PO TABS: 650.0000 mg | ORAL_TABLET | ORAL | Status: DC | PRN

## 2018-07-22 MED ORDER — LIDOCAINE HCL (PF) 1 % IJ SOLN
INTRAMUSCULAR | Status: DC | PRN
  Administered 2018-07-22: 1 mL via INTRADERMAL

## 2018-07-22 MED ORDER — FENTANYL CITRATE (PF) 100 MCG/2ML IJ SOLN
INTRAMUSCULAR | Status: AC
  Filled 2018-07-22: qty 2

## 2018-07-22 MED ORDER — HEPARIN SODIUM (PORCINE) 1000 UNIT/ML IJ SOLN
INTRAMUSCULAR | Status: AC
  Filled 2018-07-22: qty 1

## 2018-07-22 MED ORDER — ADENOSINE (DIAGNOSTIC) 3 MG/ML IV SOLN
INTRAVENOUS | Status: AC
  Filled 2018-07-22: qty 30

## 2018-07-22 MED ORDER — ATORVASTATIN CALCIUM 40 MG PO TABS: 40.0000 mg | ORAL_TABLET | Freq: Every day | ORAL | 11 refills | Status: DC

## 2018-07-22 MED ORDER — ASPIRIN 81 MG PO CHEW: 81.0000 mg | CHEWABLE_TABLET | ORAL | Status: DC

## 2018-07-22 MED ORDER — ADENOSINE (DIAGNOSTIC) 140MCG/KG/MIN
INTRAVENOUS | Status: DC | PRN
  Administered 2018-07-22: 443.142 ug/kg/min via INTRAVENOUS

## 2018-07-22 MED ORDER — MIDAZOLAM HCL 2 MG/2ML IJ SOLN
INTRAMUSCULAR | Status: DC | PRN
  Administered 2018-07-22: 1 mg via INTRAVENOUS
  Administered 2018-07-22 (×2): 0.5 mg via INTRAVENOUS

## 2018-07-22 MED ORDER — ACETAMINOPHEN 325 MG PO TABS
ORAL_TABLET | ORAL | Status: AC
  Filled 2018-07-22: qty 2

## 2018-07-22 MED ORDER — ISOSORBIDE MONONITRATE ER 30 MG PO TB24: 15.0000 mg | ORAL_TABLET | Freq: Every day | ORAL | 5 refills | Status: DC

## 2018-07-22 MED ORDER — SODIUM CHLORIDE 0.9 % IV SOLN: 250.0000 mL | INTRAVENOUS | Status: DC | PRN

## 2018-07-22 MED ORDER — LABETALOL HCL 5 MG/ML IV SOLN: 10.0000 mg | INTRAVENOUS | Status: DC | PRN

## 2018-07-22 MED ORDER — SODIUM CHLORIDE 0.9 % WEIGHT BASED INFUSION: 1.0000 mL/kg/h | INTRAVENOUS | Status: DC

## 2018-07-22 MED ORDER — IOPAMIDOL (ISOVUE-300) INJECTION 61%
INTRAVENOUS | Status: DC | PRN
  Administered 2018-07-22: 110 mL via INTRA_ARTERIAL

## 2018-07-22 MED ORDER — SODIUM CHLORIDE 0.9 % IV SOLN: INTRAVENOUS | Status: DC

## 2018-07-22 MED ORDER — HEPARIN (PORCINE) IN NACL 1000-0.9 UT/500ML-% IV SOLN
INTRAVENOUS | Status: AC
  Filled 2018-07-22: qty 1000

## 2018-07-22 MED ORDER — ONDANSETRON HCL 4 MG/2ML IJ SOLN: 4.0000 mg | Freq: Four times a day (QID) | INTRAMUSCULAR | Status: DC | PRN

## 2018-07-22 SURGICAL SUPPLY — 14 items
BALLN TREK RX 2.25X12 (BALLOONS) ×3
BALLOON TREK RX 2.25X12 (BALLOONS) ×1 IMPLANT
CATH INFINITI 5 FR JL3.5 (CATHETERS) ×3 IMPLANT
CATH INFINITI JR4 5F (CATHETERS) ×3 IMPLANT
CATH LAUNCHER 6FR EBU 3 (CATHETERS) ×3 IMPLANT
DEVICE INFLAT 30 PLUS (MISCELLANEOUS) ×3 IMPLANT
DEVICE RAD TR BAND REGULAR (VASCULAR PRODUCTS) ×3 IMPLANT
KIT MANI 3VAL PERCEP (MISCELLANEOUS) ×3 IMPLANT
NEEDLE PERC 21GX4CM (NEEDLE) ×3 IMPLANT
PACK CARDIAC CATH (CUSTOM PROCEDURE TRAY) ×3 IMPLANT
SHEATH RAIN RADIAL 21G 6FR (SHEATH) ×3 IMPLANT
WIRE G HI TQ BMW 190 (WIRE) ×3 IMPLANT
WIRE PRESSURE VERRATA (WIRE) ×3 IMPLANT
WIRE ROSEN-J .035X260CM (WIRE) ×3 IMPLANT

## 2018-07-22 NOTE — Interval H&P Note (Signed)
History and Physical Interval Note:  07/22/2018 10:26 AM  Nancy Skinner  has presented today for cardiac catheterization, with the diagnosis of accelerating angina.  The various methods of treatment have been discussed with the patient and family. After consideration of risks, benefits and other options for treatment, the patient has consented to  Procedure(s): LEFT HEART CATH AND CORONARY ANGIOGRAPHY (N/A) as a surgical intervention .  The patient's history has been reviewed, patient examined, no change in status, stable for surgery.  I have reviewed the patient's chart and labs.  Questions were answered to the patient's satisfaction.    Cath Lab Visit (complete for each Cath Lab visit)  Clinical Evaluation Leading to the Procedure:   ACS: No.  Non-ACS:    Anginal Classification: CCS III  Anti-ischemic medical therapy: Minimal Therapy (1 class of medications)  Non-Invasive Test Results: No non-invasive testing performed  Prior CABG: No previous CABG  [name]

## 2018-07-22 NOTE — Discharge Instructions (Signed)

## 2018-07-22 NOTE — Brief Op Note (Signed)
BRIEF CARDIAC CATHETERIZATION NOTE  DATE: 07/22/2018 TIME: 12:05 PM  PATIENT:  Nancy Skinner  52 y.o. female  PRE-OPERATIVE DIAGNOSIS:  Accelerating angina  POST-OPERATIVE DIAGNOSIS:  Same  PROCEDURE:  Procedure(s): LEFT HEART CATH AND CORONARY ANGIOGRAPHY (N/A) CORONARY STENT INTERVENTION (N/A)  SURGEON:  Surgeon(s) and Role:    * Jadarian Mckay, MD - Primary  FINDINGS: 1. Moderate single-vessel CAD with 60% mid LAD stenosis (iFR 0.91, FFR 0.83). 2. Normal LVEF and LVEDP. 3. Right radial artery spasm requiring balloon-assisted tracking for guide catheter advancement.  RECOMMENDATIONS: 1. Continue metoprolol; initiate isosorbide mononitrate 15 mg daily with uptitration as tolerated. 2. Statin therapy. 3. Follow-up as outpatient in 2-4 weeks.  [name], MD [facility] Pager: [phone]

## 2018-07-23 ENCOUNTER — Ambulatory Visit
Admission: RE | Admit: 2018-07-23 | Discharge: 2018-07-23 | Disposition: A | Discharge status: Home / Self Care | Payer: Self-pay | Source: Ambulatory Visit | Attending: Internal Medicine | Admitting: Internal Medicine

## 2018-07-23 ENCOUNTER — Encounter: Payer: Self-pay | Admitting: Internal Medicine

## 2018-07-23 DIAGNOSIS — R911 Solitary pulmonary nodule: Secondary | ICD-10-CM

## 2018-07-23 DIAGNOSIS — R590 Localized enlarged lymph nodes: Secondary | ICD-10-CM | POA: Insufficient documentation

## 2018-07-23 DIAGNOSIS — J8410 Pulmonary fibrosis, unspecified: Secondary | ICD-10-CM | POA: Insufficient documentation

## 2018-07-23 MED ORDER — IOPAMIDOL (ISOVUE-300) INJECTION 61%
50.0000 mL | Freq: Once | INTRAVENOUS | Status: AC | PRN
  Administered 2018-07-23: 75 mL via INTRAVENOUS

## 2018-07-27 LAB — POCT ACTIVATED CLOTTING TIME: Activated Clotting Time: 241 seconds

## 2018-07-30 ENCOUNTER — Telehealth: Payer: Self-pay | Admitting: *Deleted

## 2018-07-30 ENCOUNTER — Ambulatory Visit (INDEPENDENT_AMBULATORY_CARE_PROVIDER_SITE_OTHER): Payer: Self-pay | Admitting: Internal Medicine

## 2018-07-30 ENCOUNTER — Encounter: Payer: Self-pay | Admitting: Internal Medicine

## 2018-07-30 VITALS — BP 132/78 | HR 90 | Ht 63.0 in | Wt 188.0 lb

## 2018-07-30 DIAGNOSIS — F17200 Nicotine dependence, unspecified, uncomplicated: Secondary | ICD-10-CM

## 2018-07-30 DIAGNOSIS — R911 Solitary pulmonary nodule: Secondary | ICD-10-CM

## 2018-07-30 DIAGNOSIS — IMO0001 Reserved for inherently not codable concepts without codable children: Secondary | ICD-10-CM | POA: Insufficient documentation

## 2018-07-30 DIAGNOSIS — I200 Unstable angina: Secondary | ICD-10-CM

## 2018-07-30 DIAGNOSIS — D72829 Elevated white blood cell count, unspecified: Secondary | ICD-10-CM

## 2018-07-30 MED ORDER — BUPROPION HCL ER (XL) 150 MG PO TB24: 150.0000 mg | ORAL_TABLET | Freq: Every day | ORAL | 5 refills | Status: DC

## 2018-07-30 NOTE — Telephone Encounter (Signed)
It appears patient had a CT chest ordered by Dr Judithann GravesBerglund on 07/23/18. She is seeing Dr Judithann GravesBerglund today as well. No further action needed at this time.

## 2018-07-30 NOTE — Patient Instructions (Signed)
Cut the Nitroglycerin in quarters - and take 1/4 per day

## 2018-07-30 NOTE — Progress Notes (Signed)
Date:  07/30/2018   Name:  Nancy Skinner   DOB:  September 12, 1966   MRN:  [id number]   Chief Complaint: Nicotine Dependence Nicotine Dependence  Presents for initial visit. Symptoms are negative for fatigue. Preferred tobacco types include cigarettes. Her urge triggers include company of smokers and stress. She smokes < 1/2 a pack of cigarettes per day. Past treatments include nicotine lozenge. Jayma is ready to quit.   Pulmonary nodule - found on CXR for elevated WBC.  Found to be a granuloma on CT but another 3 mm nodule was seen.  Recommending repeat CT in one year.  CAD - noted on Cardiac cath. Moderate focal mid LAD stenosis no hemodynamically significant. Optimal medical management planned. Imdur is causing a HA despite changing it to HS.   Review of Systems  Constitutional: Negative for appetite change, fatigue, fever and unexpected weight change.  Eyes: Negative for visual disturbance.  Respiratory: Negative for cough, chest tightness and shortness of breath.   Cardiovascular: Negative for chest pain, palpitations and leg swelling.  Gastrointestinal: Negative for abdominal pain.  Genitourinary: Negative for dysuria and hematuria.  Musculoskeletal: Negative for arthralgias.  Neurological: Negative for dizziness, tremors, numbness and headaches.  Psychiatric/Behavioral: Negative for dysphoric mood.    Patient Active Problem List   Diagnosis Date Noted  . Pulmonary nodule less than 6 cm determined by computed tomography of lung 07/30/2018  . Accelerating angina (HCC)   . Dyslipidemia 07/16/2018  . Night sweats 07/16/2018  . Neck muscle spasm 07/15/2018  . Mixed hyperlipidemia 07/15/2018  . Tobacco use disorder 07/15/2018  . Migraine without aura and without status migrainosus, not intractable 06/24/2018  . Gastroesophageal reflux disease without esophagitis 06/24/2018  . Chronic midline low back pain without sciatica 06/24/2018    Allergies  Allergen Reactions  . Dye  Fdc Red [Red Dye] Other (See Comments)    Severe migraine    Past Surgical History:  Procedure Laterality Date  . ABDOMINAL HYSTERECTOMY  2012   partial for bleeding  . BREAST BIOPSY Right    neg  . BREAST BIOPSY Left 2015   4 bxs- neg  . BREAST SURGERY Left    multiple biopsies  . CATARACT EXTRACTION Right 12/19/2010  . CHOLECYSTECTOMY  1999  . INTRAVASCULAR PRESSURE WIRE/FFR STUDY N/A 07/22/2018   Procedure: INTRAVASCULAR PRESSURE WIRE/FFR STUDY;  Surgeon: Yvonne KendallEnd, Christopher, MD;  Location: [facility] INVASIVE CV LAB;  Service: Cardiovascular;  Laterality: N/A;  . LEFT HEART CATH AND CORONARY ANGIOGRAPHY N/A 07/22/2018   Procedure: LEFT HEART CATH AND CORONARY ANGIOGRAPHY;  Surgeon: Yvonne KendallEnd, Christopher, MD;  Location: [facility] INVASIVE CV LAB;  Service: Cardiovascular;  Laterality: N/A;  . POSTERIOR LUMBAR FUSION 4 LEVEL  2000    Social History   Tobacco Use  . Smoking status: Current Every Day Smoker    Packs/day: 0.50    Years: 40.00    Pack years: 20.00    Types: Cigarettes  . Smokeless tobacco: Never Used  . Tobacco comment: not smoked for three days  Substance Use Topics  . Alcohol use: Yes    Comment: less than once a week.  . Drug use: Never     Medication list has been reviewed and updated.  Current Meds  Medication Sig  . Aspirin-Acetaminophen (GOODYS BODY PAIN PO) Take 1 packet by mouth daily.   Marland Kitchen. atorvastatin (LIPITOR) 40 MG tablet Take 1 tablet (40 mg total) by mouth daily.  . cyclobenzaprine (FLEXERIL) 10 MG tablet Take 1 tablet (10 mg total)  by mouth 3 (three) times daily as needed for muscle spasms.  . Esomeprazole Magnesium (NEXIUM PO) Take 20 mg by mouth daily.   . isosorbide mononitrate (IMDUR) 30 MG 24 hr tablet Take 0.5 tablets (15 mg total) by mouth daily.  . Lidocaine 4 % PTCH Apply 1 patch topically daily as needed (pain).   . metoprolol succinate (TOPROL XL) 25 MG 24 hr tablet Take 0.5 tablets (12.5 mg total) by mouth daily.  . Multiple Vitamin (MULTIVITAMIN)  capsule Take 1 capsule by mouth daily.  . nitroGLYCERIN (NITROSTAT) 0.4 MG SL tablet Place 1 tablet (0.4 mg total) under the tongue every 5 (five) minutes as needed for chest pain.  . SUMAtriptan (IMITREX) 100 MG tablet Take 1 tablet (100 mg total) by mouth every 2 (two) hours as needed for migraine. May repeat in 2 hours if headache persists or recurs.    PHQ 2/9 Scores 06/24/2018  PHQ - 2 Score 0    Physical Exam  Constitutional: She is oriented to person, place, and time. She appears well-developed. No distress.  HENT:  Head: Normocephalic and atraumatic.  Cardiovascular: Normal rate, regular rhythm and normal heart sounds.  Pulmonary/Chest: Effort normal and breath sounds normal. No respiratory distress.  Musculoskeletal: Normal range of motion.  Neurological: She is alert and oriented to person, place, and time.  Skin: Skin is warm and dry. No rash noted.  Psychiatric: She has a normal mood and affect. Her behavior is normal. Thought content normal.  Nursing note and vitals reviewed.   BP 132/78   Pulse 90   Ht 5\' 3"  (1.6 m)   Wt 188 lb (85.3 kg)   SpO2 97%   BMI 33.30 kg/m   Assessment and Plan: 1. Tobacco use disorder Begin bupropion - buPROPion (WELLBUTRIN XL) 150 MG 24 hr tablet; Take 1 tablet (150 mg total) by mouth daily.  Dispense: 30 tablet; Refill: 5  2. Pulmonary nodule less than 6 cm determined by computed tomography of lung Repeat CT in one year  3. Accelerating angina (HCC) Intolerant of regular dose Imdur Reduce to 1/4 tablet daily  4. Leukocytosis, unspecified type Repeat CBC to document normalization - CBC with Differential/Platelet   Meds ordered this encounter  Medications  . buPROPion (WELLBUTRIN XL) 150 MG 24 hr tablet    Sig: Take 1 tablet (150 mg total) by mouth daily.    Dispense:  30 tablet    Refill:  5    Partially dictated using Animal nutritionistDragon software. Any errors are unintentional.  Bari EdwardLaura Aashna Matson, MD [facility]  07/30/2018

## 2018-07-30 NOTE — Telephone Encounter (Signed)
-----   Message from Yvonne Kendallhristopher End, MD sent at 07/21/2018  3:05 PM EDT ----- Thank you for the information.  I agree that lung nodule does not need to postpone catheterization but certainly should be followed up on by chest CT.  Please let me know if any other questions or concerns arise.  Chris End  ----- Message ----- From: Letta MedianHolden, Jamie Ann, CMA Sent: 07/21/2018   2:59 PM To: Yvonne Kendallhristopher End, MD  Please review Results of latest Chest Xray prior to doing the Cardiac Cath on this patient. Per Bari EdwardLaura Berglund PCP.

## 2018-08-03 ENCOUNTER — Encounter: Payer: Self-pay | Admitting: Physician Assistant

## 2018-08-03 NOTE — Progress Notes (Deleted)
Cardiology Office Note Date:  08/03/2018  Patient ID:  Nancy Skinner, DOB 08-16-1966, MRN [id number] PCP:  Reubin MilanBerglund, Laura H, MD  Cardiologist:  Dr. Okey DupreEnd, MD  ***refresh   Chief Complaint: Follow up  History of Present Illness: Nancy Skinner is a 52 y.o. female with history of HLD, depression, migraine disorder, pulmonary nodule, tobacco abuse, and lumbar disc disease who presents for follow up of diagnostic LHC.   She was evaluated by Dr. Okey DupreEnd on 07/16/2018 for accelerating angina over the prior 5 months. EKG was NSR without abnormalities. Given her risk factors including HLD, tobacco abuse, and family history LHC was advised. She underwent diagnostic LHC on 07/22/2018 that showed left main normal, mid LAD 60% stenosis with a not significant FFR of 0.83, LCx normal, RCA normal EF 55-65%, normal LVEDP, no aortic valve stenosis. Optimization of medical therapy was advised and she was started on Imdur 15 mg daily along with Lipitor 40 mg daily and continued on Toprol XL 12.5 mg daily. CT chest on 8/9 to further evaluation pulmonary nodule noted on CXR showed an 18 mm benign calcified granuloma in the left lower lobe as well as 3 mm left lower lobe nodule with recommendation for follow imaging in 1 year. She followed up with her PCP on 8/15 and noted a headache with Imdur. Her Imdur was decreased to 1/4 tab daily.   Labs:  06/2018: TC 187, TG 773, HDL 29, A1c 5.9, LFT normal 07/2018: NA 138, K+ 4.0, glucose 103, BUN 13, SCr 0.71, WBC 16.8, HGB 14.1, PLT 357  ***   Past Medical History:  Diagnosis Date  . Coronary artery disease, non-occlusive    a. LHC 07/22/18: LM nl, mLAD 60% w/ FFR 0.83, LCx nl, RCA nl, EF 55-65%, nl LVEDP, no AS  . Depression   . Lumbar disc disease   . Migraines     Past Surgical History:  Procedure Laterality Date  . ABDOMINAL HYSTERECTOMY  2012   partial for bleeding  . BREAST BIOPSY Right    neg  . BREAST BIOPSY Left 2015   4 bxs- neg  . BREAST SURGERY Left    multiple biopsies  . CATARACT EXTRACTION Right 12/19/2010  . CHOLECYSTECTOMY  1999  . INTRAVASCULAR PRESSURE WIRE/FFR STUDY N/A 07/22/2018   Procedure: INTRAVASCULAR PRESSURE WIRE/FFR STUDY;  Surgeon: Yvonne KendallEnd, Christopher, MD;  Location: [facility];  Service: Cardiovascular;  Laterality: N/A;  . LEFT HEART CATH AND CORONARY ANGIOGRAPHY N/A 07/22/2018   Procedure: LEFT HEART CATH AND CORONARY ANGIOGRAPHY;  Surgeon: Yvonne KendallEnd, Christopher, MD;  Location: [facility];  Service: Cardiovascular;  Laterality: N/A;  . POSTERIOR LUMBAR FUSION 4 LEVEL  2000    No outpatient medications have been marked as taking for the 08/04/18 encounter (Appointment) with Sondra Bargesunn, Jackelyne Sayer M, PA-C.    Allergies:   Dye fdc red [red dye]   Social History:  The patient  reports that she has been smoking cigarettes. She has a 20.00 pack-year smoking history. She has never used smokeless tobacco. She reports that she drinks alcohol. She reports that she does not use drugs.   Family History:  The patient's family history includes Alcohol abuse in her father; Breast cancer in her maternal grandmother and paternal grandmother; Breast cancer (age of onset: 7550) in her mother; Heart attack (age of onset: 7250) in her father.  ROS:   ROS   PHYSICAL EXAM: *** VS:  There were no vitals taken for this visit. BMI: There is no height  or weight on file to calculate BMI.  Physical Exam   EKG:  Was ordered and interpreted by me today. Shows ***  Recent Labs: 06/26/2018: ALT 18 07/16/2018: BUN 13; Creatinine, Ser 0.71; Hemoglobin 14.1; Platelets 357; Potassium 4.0; Sodium 138  06/26/2018: Cholesterol 187; HDL 29; Triglycerides 773   Estimated Creatinine Clearance: 86.2 mL/min (by C-G formula based on SCr of 0.71 mg/dL).   Wt Readings from Last 3 Encounters:  07/30/18 188 lb (85.3 kg)  07/22/18 188 lb (85.3 kg)  07/16/18 188 lb 4 oz (85.4 kg)     Other studies reviewed: Additional studies/records reviewed today include:  summarized above  ASSESSMENT AND PLAN:  1. ***  Disposition: F/u with Dr. Okey DupreEnd or an APP in ***  Current medicines are reviewed at length with the patient today.  The patient did not have any concerns regarding medicines.  Signed, Eula Listenyan Jaydn Moscato, PA-C 08/03/2018 10:09 AM     [facility] [place] [address] [place], [place] [address] [phone]

## 2018-08-04 ENCOUNTER — Ambulatory Visit: Payer: Self-pay | Admitting: Physician Assistant

## 2018-08-04 DIAGNOSIS — R0989 Other specified symptoms and signs involving the circulatory and respiratory systems: Secondary | ICD-10-CM

## 2018-08-04 NOTE — Progress Notes (Signed)
Getting blood work done Friday.

## 2018-08-05 ENCOUNTER — Encounter: Payer: Self-pay | Admitting: Physician Assistant

## 2018-08-08 LAB — CBC WITH DIFFERENTIAL/PLATELET
BASOS: 1 %
Basophils Absolute: 0.1 10*3/uL (ref 0.0–0.2)
EOS (ABSOLUTE): 0.5 10*3/uL — ABNORMAL HIGH (ref 0.0–0.4)
EOS: 4 %
HEMATOCRIT: 43.4 % (ref 34.0–46.6)
HEMOGLOBIN: 14.1 g/dL (ref 11.1–15.9)
IMMATURE GRANS (ABS): 0.1 10*3/uL (ref 0.0–0.1)
IMMATURE GRANULOCYTES: 1 %
LYMPHS: 42 %
Lymphocytes Absolute: 5.8 10*3/uL — ABNORMAL HIGH (ref 0.7–3.1)
MCH: 28 pg (ref 26.6–33.0)
MCHC: 32.5 g/dL (ref 31.5–35.7)
MCV: 86 fL (ref 79–97)
MONOCYTES: 6 %
Monocytes Absolute: 0.8 10*3/uL (ref 0.1–0.9)
NEUTROS ABS: 6.4 10*3/uL (ref 1.4–7.0)
Neutrophils: 46 %
Platelets: 301 10*3/uL (ref 150–450)
RBC: 5.04 x10E6/uL (ref 3.77–5.28)
RDW: 14.5 % (ref 12.3–15.4)
WBC: 13.7 10*3/uL — ABNORMAL HIGH (ref 3.4–10.8)

## 2018-08-09 ENCOUNTER — Encounter: Payer: Self-pay | Admitting: Internal Medicine

## 2018-08-09 DIAGNOSIS — D729 Disorder of white blood cells, unspecified: Secondary | ICD-10-CM | POA: Insufficient documentation

## 2018-09-15 ENCOUNTER — Ambulatory Visit (INDEPENDENT_AMBULATORY_CARE_PROVIDER_SITE_OTHER): Payer: Self-pay | Admitting: Nurse Practitioner

## 2018-09-15 ENCOUNTER — Encounter: Payer: Self-pay | Admitting: Nurse Practitioner

## 2018-09-15 ENCOUNTER — Encounter: Payer: Self-pay | Admitting: *Deleted

## 2018-09-15 VITALS — BP 110/80 | HR 71 | Ht 63.0 in | Wt 193.5 lb

## 2018-09-15 DIAGNOSIS — E782 Mixed hyperlipidemia: Secondary | ICD-10-CM

## 2018-09-15 DIAGNOSIS — R5383 Other fatigue: Secondary | ICD-10-CM

## 2018-09-15 DIAGNOSIS — R079 Chest pain, unspecified: Secondary | ICD-10-CM

## 2018-09-15 NOTE — Patient Instructions (Addendum)
Medication Instructions: - Your physician has recommended you make the following change in your medication:   1) STOP imdur (isorbide) 2) STOP toprol (metoprolol succinate)  3) In 3-4 days, if your rash is no better off of imdur (isorbide) then STOP lipitor (atorvastatin)  Labwork: - none ordered  Procedures/Testing: - none ordered  Follow-Up: - Your physician recommends that you schedule a follow-up appointment in: 2-3 months with Dr. Okey Dupre.   Any Additional Special Instructions Will Be Listed Below (If Applicable).     If you need a refill on your cardiac medications before your next appointment, please call your pharmacy.

## 2018-09-15 NOTE — Progress Notes (Signed)
Office Visit    Patient Name: Nancy Skinner Date of Encounter: 09/15/2018  Primary Care Provider:  Reubin Milan, MD Primary Cardiologist:  Yvonne Kendall, MD  Chief Complaint    52 year old female with a history of mixed hyperlipidemia, depression, migraines, and chest pain, who presents for follow-up after recent diagnostic catheterization.  Past Medical History    Past Medical History:  Diagnosis Date  . Coronary artery disease, non-occlusive    a. LHC 07/22/18: LM nl, mLAD 60% w/ FFR 0.83, LCx nl, RCA nl, EF 55-65%, nl LVEDP, no AS.  . Depression   . Lumbar disc disease   . Migraines   . Mixed hyperlipidemia    Past Surgical History:  Procedure Laterality Date  . ABDOMINAL HYSTERECTOMY  2012   partial for bleeding  . BREAST BIOPSY Right    neg  . BREAST BIOPSY Left 2015   4 bxs- neg  . BREAST SURGERY Left    multiple biopsies  . CATARACT EXTRACTION Right 12/19/2010  . CHOLECYSTECTOMY  1999  . INTRAVASCULAR PRESSURE WIRE/FFR STUDY N/A 07/22/2018   Procedure: INTRAVASCULAR PRESSURE WIRE/FFR STUDY;  Surgeon: Yvonne Kendall, MD;  Location: [facility];  Service: Cardiovascular;  Laterality: N/A;  . LEFT HEART CATH AND CORONARY ANGIOGRAPHY N/A 07/22/2018   Procedure: LEFT HEART CATH AND CORONARY ANGIOGRAPHY;  Surgeon: Yvonne Kendall, MD;  Location: [facility];  Service: Cardiovascular;  Laterality: N/A;  . POSTERIOR LUMBAR FUSION 4 LEVEL  2000    Allergies  Allergies  Allergen Reactions  . Dye Fdc Red [Red Dye] Other (See Comments)    Severe migraine    History of Present Illness    52 year old female with the above past medical history including hyperlipidemia, depression, migraine headaches, and lumbar disc disease.  She was recently evaluated in clinic on August 1 with complaints of intermittent stabbing substernal chest pain that radiates to her left shoulder and down her left arm, associated with tingling in her hand.  Episodes were  typically lasting 30 to 60 seconds.  She was placed on low-dose metoprolol and after seeing Dr. Okey Dupre, decision was made to pursue diagnostic catheterization, which took place on August 7, revealing moderate nonobstructive LAD disease and otherwise normal coronaries.  Fractional flow reserve was performed within the LAD and was normal at 0.83.  LV function was normal.  Medical therapy was recommended and in the setting of mixed hyperlipidemia with significant hypertriglyceridemia, she was placed on atorvastatin therapy and low-dose isosorbide mononitrate.  After beginning isosorbide mononitrate, she almost immediately noted significant headaches and she has been taking only a quarter of a 30 mg tablet daily with continued mild headaches.  She has not been having any chest pain.  She has had significant fatigue ever since starting metoprolol and she thinks this is worsened her depression.  Finally, she has also been having itching over her arms and legs which she thinks may be coming from either the isosorbide or atorvastatin, as it started shortly after her diagnostic catheterization.  Fortunately, she has not been having any chest pain or dyspnea and further denies PND, orthopnea, dizziness, syncope, edema, or early satiety.  Home Medications    Prior to Admission medications   Medication Sig Start Date End Date Taking? Authorizing Provider  Aspirin-Acetaminophen (GOODYS BODY PAIN PO) Take 1 packet by mouth daily.    Yes [provider]  atorvastatin (LIPITOR) 40 MG tablet Take 1 tablet (40 mg total) by mouth daily. 07/22/18 07/22/19 Yes  End, Cristal Deer, MD  buPROPion (WELLBUTRIN XL) 150 MG 24 hr tablet Take 1 tablet (150 mg total) by mouth daily. 07/30/18  Yes Reubin Milan, MD  cyclobenzaprine (FLEXERIL) 10 MG tablet Take 1 tablet (10 mg total) by mouth 3 (three) times daily as needed for muscle spasms. 06/24/18  Yes Reubin Milan, MD  Esomeprazole Magnesium (NEXIUM PO) Take 20 mg by mouth  daily.    Yes [provider]  isosorbide mononitrate (IMDUR) 30 MG 24 hr tablet Take 0.5 tablets (15 mg total) by mouth daily. Patient taking differently: Take 1/4 tablet daily. 07/22/18 07/22/19 Yes End, Cristal Deer, MD  Lidocaine 4 % PTCH Apply 1 patch topically daily as needed (pain).    Yes [provider]  metoprolol succinate (TOPROL XL) 25 MG 24 hr tablet Take 0.5 tablets (12.5 mg total) by mouth daily. 07/16/18  Yes End, Cristal Deer, MD  Multiple Vitamin (MULTIVITAMIN) capsule Take 1 capsule by mouth daily.   Yes [provider]  nitroGLYCERIN (NITROSTAT) 0.4 MG SL tablet Place 1 tablet (0.4 mg total) under the tongue every 5 (five) minutes as needed for chest pain. 07/16/18 10/14/18 Yes End, Cristal Deer, MD  SUMAtriptan (IMITREX) 100 MG tablet Take 1 tablet (100 mg total) by mouth every 2 (two) hours as needed for migraine. May repeat in 2 hours if headache persists or recurs. 06/24/18  Yes Reubin Milan, MD    Review of Systems    Fatigue, depression, itching as outlined above.  She denies chest pain, palpitations, PND, orthopnea, dizziness, syncope, edema, or early satiety.  All other systems reviewed and are otherwise negative except as noted above.  Physical Exam    VS:  BP 110/80 (BP Location: Left Arm, Patient Position: Sitting, Cuff Size: Normal)   Pulse 71   Ht 5\' 3"  (1.6 m)   Wt 193 lb 8 oz (87.8 kg)   BMI 34.28 kg/m  , BMI Body mass index is 34.28 kg/m. GEN: Well nourished, well developed, in no acute distress. HEENT: normal. Neck: Supple, no JVD, carotid bruits, or masses. Cardiac: RRR, no murmurs, rubs, or gallops. No clubbing, cyanosis, edema.  Radials/DP/PT 2+ and equal bilaterally.  Right radial catheterization site without bleeding, bruit, or hematoma. Respiratory:  Respirations regular and unlabored, clear to auscultation bilaterally. GI: Soft, nontender, nondistended, BS + x 4. MS: no deformity or atrophy. Skin: warm and dry, scattered  fairly focal raised areas of erythema, similar to the appearance of bug bites which patient says began to show up after starting isosorbide and atorvastatin. Neuro:  Strength and sensation are intact. Psych: Normal affect.  Accessory Clinical Findings    ECG personally reviewed by me today -regular sinus rhythm, 71, first-degree AV block, no acute ST or T changes  Assessment & Plan    1.  Chest pain/nonobstructive CAD: Patient previously seen in August with typical and atypical chest pain.  Diagnostic catheterization showed moderate LAD disease with normal fractional flow reserve.  Medical therapy was recommended.  She has not been having any recurrence of chest pain.  She has been on beta-blocker since prior to her catheterization and then following catheterization, she was also placed on isosorbide and atorvastatin.  Isosorbide has been causing headaches, even at just a quarter of a tablet, and I recommended that she discontinue this.  We could always reconsider this or initiating Ranexa if she has any recurrence of chest discomfort.  Since starting metoprolol, she has been having significant fatigue and worsening of depression.  She  would like to come off of this and I think that that would be appropriate.  Finally, she has developed a rash since her catheterization.  She is not sure if this is coming from isosorbide or statin therapy.  I have advised that since she is stopping the nitrate, we can see if rash and itching improve over the next week but if not, she should hold her atorvastatin.  If symptoms resolve with holding atorvastatin, we would have to look for an alternate statin, most likely rosuvastatin.  She says she will plan to follow-up with her primary care provider later this month as scheduled, and will report symptoms at that time and potentially discuss resuming an alternate statin if necessary.  2.  Mixed hyperlipidemia/hypertriglyceridemia: See discussion regarding Lipitor as above.   She may be holding this depending on what happens with a rash off of isosorbide.  We do feel that she should have aggressive lipid management in the setting of nonobstructive CAD.  3.  Fatigue: This is been present since initiating metoprolol.  Her depression has also worsened.  She is going to hold metoprolol.  4.  Depression: See #3.  5.  Disposition: We discussed her medications at length today.  She has follow-up with primary care later this month.  I will arrange for follow-up here in 2 to 3 months.   Nicolasa Ducking, NP 09/15/2018, 4:18 PM

## 2018-09-22 ENCOUNTER — Telehealth: Payer: Self-pay | Admitting: Internal Medicine

## 2018-09-22 NOTE — Telephone Encounter (Signed)
Patient   Pt c/o medication issue:  1. Name of Medication: Lipitor   2. How are you currently taking this medication (dosage and times per day)? 40 mg 1 a day   3. Are you having a reaction (difficulty breathing--STAT)? No   4. What is your medication issue? Allergic reaction, left side of body being affected. She states when she was here last she was told to call and let us know if she wanted to switch other to something else.   She is needing something else in place of this Please advise

## 2018-09-22 NOTE — Telephone Encounter (Signed)
S/w patient. She was here on 09/15/18 and saw Ward Givens.  She stopped Toprol and Imdur because of a rash.  It has now been a week and the rash is not better. She took last dose of Lipitor yesterday and has planned to stop that now. She says the itching is so bad that it wakes her up at night. Its on her right arm, right foot and right knee.  Advised to remain off Lipitor for now and call us back in a few days with an update. Routing to [place] for review and any further changes.

## 2018-09-23 NOTE — Telephone Encounter (Signed)
Thanks for the update.  If rash resolves off of lipitor, we should try rosuvastatin 10mg  daily next.  If rash doesn't resolve, she'll need to f/u with pcp, as it would then be less likely that rash is related to either statin, nitrate, or  blocker, since she'll have been off of all of them for several weeks.

## 2018-11-10 ENCOUNTER — Encounter: Payer: Self-pay | Admitting: Internal Medicine

## 2018-11-18 ENCOUNTER — Ambulatory Visit: Payer: Self-pay | Admitting: Internal Medicine

## 2018-12-31 ENCOUNTER — Ambulatory Visit: Payer: Self-pay | Admitting: Internal Medicine

## 2019-01-04 NOTE — Progress Notes (Deleted)
Cardiology Office Note Date:  01/04/2019  Patient ID:  Nancy, Skinner [birth year], MRN [id number] PCP:  Reubin Milan, MD  Cardiologist:  Dr. Okey Dupre, MD  ***refresh   Chief Complaint: Follow up  History of Present Illness: Nancy Skinner is a 53 y.o. female with history of nonobstructive CAD by cardiac cath in 07/2018 as outlined below, hyperlipidemia, migraine disorder, depression, and chest pain who presents for follow-up.  Patient was admitted to the hospital in 07/2017 with complaints of intermittent stabbing substernal chest pain that radiated to her left shoulder and down her left arm.  She underwent diagnostic cardiac cath on 07/22/2017 which revealed moderate nonobstructive LAD disease estimated at 60% with an FFR of 0.83, otherwise normal LCx and RCA.  LVEF 55 to 65% with a normal LVEDP and no aortic stenosis.  Medical therapy was recommended.  After beginning Imdur, she almost immediately noted significant headaches.  She was last seen in the office in 09/2017 and was noted to not have any further chest pain.  She noted significant fatigue since starting metoprolol and felt like this it also worsened her depression.  Lastly, she noted pruritus over her arms and legs which she felt was either coming from Imdur or Lipitor.  It was recommended she discontinue Imdur given her significant headache burden.  Her metoprolol was also discontinued as she felt like this was leading to significant fatigue and worsening depression.  She was continued on Lipitor with recommendation to monitor her rash.  Patient called later in 09/2018 noted her rash was not improved.  In this setting, it was recommended she discontinue Lipitor.  ***   Past Medical History:  Diagnosis Date  . Coronary artery disease, non-occlusive    a. LHC 07/22/18: LM nl, mLAD 60% w/ FFR 0.83, LCx nl, RCA nl, EF 55-65%, nl LVEDP, no AS.  . Depression   . Lumbar disc disease   . Migraines   . Mixed hyperlipidemia     Past  Surgical History:  Procedure Laterality Date  . ABDOMINAL HYSTERECTOMY  2012   partial for bleeding  . BREAST BIOPSY Right    neg  . BREAST BIOPSY Left 2015   4 bxs- neg  . BREAST SURGERY Left    multiple biopsies  . CATARACT EXTRACTION Right 12/19/2010  . CHOLECYSTECTOMY  1999  . INTRAVASCULAR PRESSURE WIRE/FFR STUDY N/A 07/22/2018   Procedure: INTRAVASCULAR PRESSURE WIRE/FFR STUDY;  Surgeon: Yvonne Kendall, MD;  Location: [facility];  Service: Cardiovascular;  Laterality: N/A;  . LEFT HEART CATH AND CORONARY ANGIOGRAPHY N/A 07/22/2018   Procedure: LEFT HEART CATH AND CORONARY ANGIOGRAPHY;  Surgeon: Yvonne Kendall, MD;  Location: [facility];  Service: Cardiovascular;  Laterality: N/A;  . POSTERIOR LUMBAR FUSION 4 LEVEL  2000    No outpatient medications have been marked as taking for the 01/05/19 encounter (Appointment) with Sondra Barges, PA-C.    Allergies:   Dye fdc red [red dye]   Social History:  The patient  reports that she has been smoking cigarettes. She has a 10.00 pack-year smoking history. She has never used smokeless tobacco. She reports current alcohol use. She reports that she does not use drugs.   Family History:  The patient's family history includes Alcohol abuse in her father; Breast cancer in her maternal grandmother and paternal grandmother; Breast cancer (age of onset: 51) in her mother; Heart attack (age of onset: 17) in her father.  ROS:   ROS   PHYSICAL EXAM: ***  VS:  There were no vitals taken for this visit. BMI: There is no height or weight on file to calculate BMI.  Physical Exam   EKG:  Was ordered and interpreted by me today. Shows ***  Recent Labs: 06/26/2018: ALT 18 07/16/2018: BUN 13; Creatinine, Ser 0.71; Potassium 4.0; Sodium 138 08/07/2018: Hemoglobin 14.1; Platelets 301  06/26/2018: Cholesterol 187; HDL 29; Triglycerides 773   CrCl cannot be calculated (Patient's most recent lab result is older than the maximum 21 days  allowed.).   Wt Readings from Last 3 Encounters:  09/15/18 193 lb 8 oz (87.8 kg)  07/30/18 188 lb (85.3 kg)  07/22/18 188 lb (85.3 kg)     Other studies reviewed: Additional studies/records reviewed today include: summarized above  ASSESSMENT AND PLAN:  1. ***  Disposition: F/u with Dr. Okey DupreEnd or an APP in ***  Current medicines are reviewed at length with the patient today.  The patient did not have any concerns regarding medicines.  Signed, Eula Listenyan Janelle Culton, PA-C 01/04/2019 8:10 AM     [facility] [place] [address] [place], [place] [address] [phone]

## 2019-01-05 ENCOUNTER — Ambulatory Visit: Payer: Self-pay | Admitting: Physician Assistant

## 2019-01-16 ENCOUNTER — Other Ambulatory Visit: Payer: Self-pay | Admitting: Internal Medicine

## 2019-01-16 DIAGNOSIS — F17200 Nicotine dependence, unspecified, uncomplicated: Secondary | ICD-10-CM

## 2019-01-18 ENCOUNTER — Other Ambulatory Visit: Payer: Self-pay

## 2019-01-18 ENCOUNTER — Encounter: Payer: Self-pay | Admitting: Internal Medicine

## 2019-01-18 ENCOUNTER — Ambulatory Visit: Payer: 59 | Admitting: Internal Medicine

## 2019-01-18 VITALS — BP 118/80 | HR 86 | Ht 63.0 in | Wt 195.0 lb

## 2019-01-18 DIAGNOSIS — F17200 Nicotine dependence, unspecified, uncomplicated: Secondary | ICD-10-CM

## 2019-01-18 DIAGNOSIS — I25118 Atherosclerotic heart disease of native coronary artery with other forms of angina pectoris: Secondary | ICD-10-CM | POA: Diagnosis not present

## 2019-01-18 DIAGNOSIS — F411 Generalized anxiety disorder: Secondary | ICD-10-CM

## 2019-01-18 DIAGNOSIS — F418 Other specified anxiety disorders: Secondary | ICD-10-CM | POA: Insufficient documentation

## 2019-01-18 DIAGNOSIS — F321 Major depressive disorder, single episode, moderate: Secondary | ICD-10-CM | POA: Insufficient documentation

## 2019-01-18 DIAGNOSIS — F324 Major depressive disorder, single episode, in partial remission: Secondary | ICD-10-CM | POA: Insufficient documentation

## 2019-01-18 DIAGNOSIS — L509 Urticaria, unspecified: Secondary | ICD-10-CM

## 2019-01-18 DIAGNOSIS — G43009 Migraine without aura, not intractable, without status migrainosus: Secondary | ICD-10-CM

## 2019-01-18 DIAGNOSIS — I2510 Atherosclerotic heart disease of native coronary artery without angina pectoris: Secondary | ICD-10-CM | POA: Insufficient documentation

## 2019-01-18 MED ORDER — BUPROPION HCL ER (XL) 300 MG PO TB24: 300.0000 mg | ORAL_TABLET | Freq: Every day | ORAL | 5 refills | Status: DC

## 2019-01-18 MED ORDER — SUMATRIPTAN SUCCINATE 100 MG PO TABS
100.0000 mg | ORAL_TABLET | ORAL | 5 refills | Status: DC | PRN
Start: 2019-01-18 — End: 2019-06-16

## 2019-01-18 NOTE — Progress Notes (Signed)
Date:  01/18/2019   Name:  Nancy Skinner   DOB:  Mar 10, 1966   MRN:  [id number]   Chief Complaint: Depression (follow up. ) and Migraine  Migraine   This is a recurrent problem. The problem occurs monthly. The problem has been unchanged. The pain quality is similar to prior headaches. Pertinent negatives include no abdominal pain, coughing, dizziness or fever. She has tried triptans for the symptoms. The treatment provided significant relief.  Depression         This is a chronic problem.  The problem occurs daily.  Associated symptoms include no fatigue and no headaches.  Past treatments include other medications. Non-obstructing CAD - on beta blocker and statin, did not tolerate NTG.  Recommended to be on aspirin 81 mg daily which she is taking.  She has cut back on the [place] powders. Rash - she is having hives that she believes is due to anxiety.  They are on her arms and feet and she scratches them constantly.  She tried removing all of her new medication in turns with no change.  The only thing she is not taking is IMDUR due to severe headache.  Review of Systems  Constitutional: Negative for chills, fatigue and fever.  Respiratory: Negative for cough, chest tightness, shortness of breath and wheezing.   Cardiovascular: Negative for chest pain, palpitations and leg swelling.  Gastrointestinal: Negative for abdominal pain.  Musculoskeletal: Negative for arthralgias and gait problem.  Skin: Positive for color change and rash.  Neurological: Negative for dizziness, light-headedness and headaches.  Hematological: Negative for adenopathy.  Psychiatric/Behavioral: Positive for depression. Negative for dysphoric mood and sleep disturbance. The patient is nervous/anxious.     Patient Active Problem List   Diagnosis Date Noted  . Coronary artery disease involving native heart 01/18/2019  . Neutrophilic leukocytosis 08/09/2018  . Pulmonary nodule less than 6 cm determined by computed  tomography of lung 07/30/2018  . Dyslipidemia 07/16/2018  . Night sweats 07/16/2018  . Neck muscle spasm 07/15/2018  . Mixed hyperlipidemia 07/15/2018  . Tobacco use disorder 07/15/2018  . Migraine without aura and without status migrainosus, not intractable 06/24/2018  . Gastroesophageal reflux disease without esophagitis 06/24/2018  . Chronic midline low back pain without sciatica 06/24/2018    Allergies  Allergen Reactions  . Dye Fdc Red [Red Dye] Other (See Comments)    Severe migraine    Past Surgical History:  Procedure Laterality Date  . ABDOMINAL HYSTERECTOMY  2012   partial for bleeding  . BREAST BIOPSY Right    neg  . BREAST BIOPSY Left 2015   4 bxs- neg  . BREAST SURGERY Left    multiple biopsies  . CATARACT EXTRACTION Right 12/19/2010  . CHOLECYSTECTOMY  1999  . INTRAVASCULAR PRESSURE WIRE/FFR STUDY N/A 07/22/2018   Procedure: INTRAVASCULAR PRESSURE WIRE/FFR STUDY;  Surgeon: Yvonne Kendall, MD;  Location: [facility] INVASIVE CV LAB;  Service: Cardiovascular;  Laterality: N/A;  . LEFT HEART CATH AND CORONARY ANGIOGRAPHY N/A 07/22/2018   Procedure: LEFT HEART CATH AND CORONARY ANGIOGRAPHY;  Surgeon: Yvonne Kendall, MD;  Location: [facility] INVASIVE CV LAB;  Service: Cardiovascular;  Laterality: N/A;  . POSTERIOR LUMBAR FUSION 4 LEVEL  2000    Social History   Tobacco Use  . Smoking status: Current Every Day Smoker    Packs/day: 0.25    Years: 40.00    Pack years: 10.00    Types: Cigarettes  . Smokeless tobacco: Never Used  . Tobacco comment: not smoked for three  days  Substance Use Topics  . Alcohol use: Yes    Comment: less than once a week.  . Drug use: Never     Medication list has been reviewed and updated.  Current Meds  Medication Sig  . aspirin EC 81 MG tablet Take 81 mg by mouth daily.  . Aspirin-Acetaminophen (GOODYS BODY PAIN PO) Take 1 packet by mouth daily.   Marland Kitchen. atorvastatin (LIPITOR) 40 MG tablet Take 1 tablet (40 mg total) by mouth daily.  Marland Kitchen.  buPROPion (WELLBUTRIN XL) 150 MG 24 hr tablet TAKE 1 TABLET(150 MG) BY MOUTH DAILY  . Esomeprazole Magnesium (NEXIUM PO) Take 20 mg by mouth daily.   . Lidocaine 4 % PTCH Apply 1 patch topically daily as needed (pain).   . metoprolol succinate (TOPROL-XL) 25 MG 24 hr tablet   . Multiple Vitamin (MULTIVITAMIN) capsule Take 1 capsule by mouth daily.  . nitroGLYCERIN (NITROSTAT) 0.4 MG SL tablet Place 1 tablet (0.4 mg total) under the tongue every 5 (five) minutes as needed for chest pain.  . SUMAtriptan (IMITREX) 100 MG tablet Take 1 tablet (100 mg total) by mouth every 2 (two) hours as needed for migraine. May repeat in 2 hours if headache persists or recurs.    PHQ 2/9 Scores 01/18/2019 06/24/2018  PHQ - 2 Score 2 0  PHQ- 9 Score 6 -   GAD 7 : Generalized Anxiety Score 01/18/2019  Nervous, Anxious, on Edge 3  Control/stop worrying 3  Worry too much - different things 3  Trouble relaxing 3  Restless 3  Easily annoyed or irritable 3  Afraid - awful might happen 3  Total GAD 7 Score 21  Anxiety Difficulty Extremely difficult     Physical Exam Vitals signs and nursing note reviewed.  Constitutional:      General: She is not in acute distress.    Appearance: She is well-developed.  HENT:     Head: Normocephalic and atraumatic.     Nose: Nose normal.  Neck:     Musculoskeletal: Normal range of motion and neck supple.  Cardiovascular:     Rate and Rhythm: Normal rate and regular rhythm.     Pulses: Normal pulses.  Pulmonary:     Effort: Pulmonary effort is normal. No respiratory distress.  Lymphadenopathy:     Cervical: No cervical adenopathy.  Skin:    General: Skin is warm and dry.     Findings: Erythema and rash present. Rash is urticarial.     Comments: On arms with areas of excoriation  Neurological:     Mental Status: She is alert and oriented to person, place, and time.  Psychiatric:        Behavior: Behavior normal.        Thought Content: Thought content normal.      BP 118/80   Pulse 86   Ht 5\' 3"  (1.6 m)   Wt 195 lb (88.5 kg)   SpO2 96%   BMI 34.54 kg/m   Assessment and Plan: 1. Coronary artery disease of native artery of native heart with stable angina pectoris (HCC) Continue current therapy  2. Tobacco use disorder Cutting back well Will increase bupropion - buPROPion (WELLBUTRIN XL) 300 MG 24 hr tablet; Take 1 tablet (300 mg total) by mouth daily.  Dispense: 30 tablet; Refill: 5  3. Migraine without aura and without status migrainosus, not intractable - SUMAtriptan (IMITREX) 100 MG tablet; Take 1 tablet (100 mg total) by mouth every 2 (two) hours as needed for  migraine. May repeat in 2 hours if headache persists or recurs.  Dispense: 10 tablet; Refill: 5  4. Generalized anxiety disorder Should improve with higher dose bupropion  5. Urticaria Seems related to anxiety Hopefully will improve with increase dose of Bupropion.   Partially dictated using Animal nutritionistDragon software. Any errors are unintentional.  Bari EdwardLaura Edessa Jakubowicz, MD [facility]  01/18/2019

## 2019-01-25 NOTE — Progress Notes (Signed)
Cardiology Office Note Date:  01/26/2019  Patient ID:  Nancy Skinner, DOB 1966-11-23, MRN [id number] PCP:  Reubin MilanBerglund, Laura H, MD  Cardiologist:  Dr. Okey DupreEnd, MD    Chief Complaint: Follow up  History of Present Illness: Nancy Skinner is a 53 y.o. female with history of nonobstructive CAD by cardiac cath in 07/2018 as outlined below, hyperlipidemia, migraine disorder, depression, and chest pain who presents for follow-up.  Patient was admitted to the hospital in 07/2018 with complaints of intermittent stabbing substernal chest pain that radiated to her left shoulder and down her left arm.  She underwent diagnostic cardiac cath on 07/22/2018 which revealed moderate nonobstructive LAD disease estimated at 60% with an FFR of 0.83, otherwise normal LCx and RCA.  LVEF 55 to 65% with a normal LVEDP and no aortic stenosis.  Medical therapy was recommended.  After beginning Imdur, she almost immediately noted significant headaches.  She was last seen in the office in 09/2018 and was noted to not have any further chest pain.  She noted significant fatigue since starting metoprolol and felt like this it also worsened her depression.  Lastly, she noted pruritus over her arms and legs which she felt was either coming from Imdur or Lipitor.  It was recommended she discontinue Imdur given her significant headache burden.  Her metoprolol was also discontinued as she felt like this was leading to significant fatigue and worsening depression.  She was continued on Lipitor with recommendation to monitor her rash.  Patient called later in 09/2018 noted her rash was not improved.  In this setting, it was recommended she discontinue Lipitor.  She comes in doing quite well from a cardiac perspective.  No chest pain, shortness of breath, palpitations, dizziness, presyncope, or syncope.  Following discontinuation of Imdur her headaches did improve.  Following discontinuation of Lipitor she did not notice an improvement and the  rash that was affecting her right forearm.  With this, she resumed Lipitor in approximately 10/2018 and has not noted any improvement or worsening in her rash.  She has continued to take Toprol-XL 12.5 mg once daily.  She no longer feels like this is leading to fatigue or worsening her depression.  She feels like her fatigue and depression were in the setting of she was commuting to and from [place] on a daily basis and was having to wake up at 2 in the morning each morning for work.  She has since relocated to the [place] area, works 11 miles from her home, and makes 20,000 more per year.  She has not smoked a cigarette since 01/22/2019.  She has improved her diet and is eating less sugary foods and drinks.  She does request a cream for her right arm rash.   Past Medical History:  Diagnosis Date  . Coronary artery disease, non-occlusive    a. LHC 07/22/18: LM nl, mLAD 60% w/ FFR 0.83, LCx nl, RCA nl, EF 55-65%, nl LVEDP, no AS.  . Depression   . Lumbar disc disease   . Migraines   . Mixed hyperlipidemia     Past Surgical History:  Procedure Laterality Date  . ABDOMINAL HYSTERECTOMY  2012   partial for bleeding  . BREAST BIOPSY Right    neg  . BREAST BIOPSY Left 2015   4 bxs- neg  . BREAST SURGERY Left    multiple biopsies  . CATARACT EXTRACTION Right 12/19/2010  . CHOLECYSTECTOMY  1999  . INTRAVASCULAR PRESSURE WIRE/FFR STUDY N/A 07/22/2018   Procedure: INTRAVASCULAR  PRESSURE WIRE/FFR STUDY;  Surgeon: Yvonne KendallEnd, Christopher, MD;  Location: [facility] INVASIVE CV LAB;  Service: Cardiovascular;  Laterality: N/A;  . LEFT HEART CATH AND CORONARY ANGIOGRAPHY N/A 07/22/2018   Procedure: LEFT HEART CATH AND CORONARY ANGIOGRAPHY;  Surgeon: Yvonne KendallEnd, Christopher, MD;  Location: [facility] INVASIVE CV LAB;  Service: Cardiovascular;  Laterality: N/A;  . POSTERIOR LUMBAR FUSION 4 LEVEL  2000    Current Meds  Medication Sig  . aspirin EC 81 MG tablet Take 81 mg by mouth daily.  . Aspirin-Acetaminophen (GOODYS BODY PAIN  PO) Take 1 packet by mouth daily.   Marland Kitchen. atorvastatin (LIPITOR) 40 MG tablet Take 1 tablet (40 mg total) by mouth daily.  Marland Kitchen. buPROPion (WELLBUTRIN XL) 300 MG 24 hr tablet Take 1 tablet (300 mg total) by mouth daily.  . cyclobenzaprine (FLEXERIL) 10 MG tablet Take 1 tablet (10 mg total) by mouth 3 (three) times daily as needed for muscle spasms.  . Esomeprazole Magnesium (NEXIUM PO) Take 20 mg by mouth daily.   . Lidocaine 4 % PTCH Apply 1 patch topically daily as needed (pain).   . Multiple Vitamin (MULTIVITAMIN) capsule Take 1 capsule by mouth daily.  . nitroGLYCERIN (NITROSTAT) 0.4 MG SL tablet Place 1 tablet (0.4 mg total) under the tongue every 5 (five) minutes as needed for chest pain.  . SUMAtriptan (IMITREX) 100 MG tablet Take 1 tablet (100 mg total) by mouth every 2 (two) hours as needed for migraine. May repeat in 2 hours if headache persists or recurs.  . [DISCONTINUED] metoprolol succinate (TOPROL-XL) 25 MG 24 hr tablet     Allergies:   Dye fdc red [red dye]   Social History:  The patient  reports that she has been smoking cigarettes. She has a 10.00 pack-year smoking history. She has never used smokeless tobacco. She reports current alcohol use. She reports that she does not use drugs.   Family History:  The patient's family history includes Alcohol abuse in her father; Breast cancer in her maternal grandmother and paternal grandmother; Breast cancer (age of onset: 3950) in her mother; Heart attack (age of onset: 6450) in her father.  ROS:   Review of Systems  Constitutional: Negative for chills, diaphoresis, fever, malaise/fatigue and weight loss.  HENT: Negative for congestion.   Eyes: Negative for discharge and redness.  Respiratory: Negative for cough, hemoptysis, sputum production, shortness of breath and wheezing.   Cardiovascular: Negative for chest pain, palpitations, orthopnea, claudication, leg swelling and PND.  Gastrointestinal: Negative for abdominal pain, blood in stool,  heartburn, melena, nausea and vomiting.  Genitourinary: Negative for hematuria.  Musculoskeletal: Negative for falls and myalgias.  Skin: Positive for itching and rash.       Affecting the right forearm  Neurological: Negative for dizziness, tingling, tremors, sensory change, speech change, focal weakness, loss of consciousness and weakness.  Endo/Heme/Allergies: Does not bruise/bleed easily.  Psychiatric/Behavioral: Negative for substance abuse. The patient is not nervous/anxious.   All other systems reviewed and are negative.    PHYSICAL EXAM:  VS:  BP 112/76 (BP Location: Left Arm, Patient Position: Sitting, Cuff Size: Normal)   Pulse 83   Ht 5\' 3"  (1.6 m)   Wt 198 lb 8 oz (90 kg)   BMI 35.16 kg/m  BMI: Body mass index is 35.16 kg/m.  Physical Exam  Constitutional: She is oriented to person, place, and time. She appears well-developed and well-nourished.  HENT:  Head: Normocephalic and atraumatic.  Eyes: Right eye exhibits no discharge. Left eye exhibits no  discharge.  Neck: Normal range of motion. No JVD present.  Cardiovascular: Normal rate, regular rhythm, S1 normal, S2 normal and normal heart sounds. Exam reveals no distant heart sounds, no friction rub, no midsystolic click and no opening snap.  No murmur heard. Pulses:      Posterior tibial pulses are 2+ on the right side and 2+ on the left side.  Pulmonary/Chest: Effort normal and breath sounds normal. No respiratory distress. She has no decreased breath sounds. She has no wheezes. She has no rales. She exhibits no tenderness.  Abdominal: Soft. She exhibits no distension. There is no abdominal tenderness.  Musculoskeletal:        General: No edema.  Neurological: She is alert and oriented to person, place, and time.  Skin: Skin is warm and dry. Rash noted. No cyanosis. Nails show no clubbing.     Psychiatric: She has a normal mood and affect. Her speech is normal and behavior is normal. Judgment and thought content  normal.     EKG:  Was ordered and interpreted by me today. Shows NSR, 83 bpm, no acute ST-T changes  Recent Labs: 06/26/2018: ALT 18 07/16/2018: BUN 13; Creatinine, Ser 0.71; Potassium 4.0; Sodium 138 08/07/2018: Hemoglobin 14.1; Platelets 301  06/26/2018: Cholesterol 187; HDL 29; Triglycerides 773   CrCl cannot be calculated (Patient's most recent lab result is older than the maximum 21 days allowed.).   Wt Readings from Last 3 Encounters:  01/26/19 198 lb 8 oz (90 kg)  01/18/19 195 lb (88.5 kg)  09/15/18 193 lb 8 oz (87.8 kg)     Other studies reviewed: Additional studies/records reviewed today include: summarized above  ASSESSMENT AND PLAN:  1. Nonobstructive CAD/chest pain: Doing well without any symptoms suggestive of angina.  Continue aspirin, Lipitor, and Toprol.  She did not tolerate Imdur secondary to headache.  If needed, we could consider revisiting this versus initiation of ranolazine.  No plans for ischemic evaluation at this time.  Aggressive risk factor modification including continued smoking cessation and lipid control.  2. Hyperlipidemia/hypertriglyceridemia: LDL not calculated on recent check in 06/2018 secondary to hypertriglyceridemia.  She briefly discontinued Lipitor for 2 weeks in the fall 2019 without improvement in rash.  She has since resumed Lipitor 40 mg daily as of approximately November, 2019 and is tolerating without issues.  She has improved her diet.  Check lipid panel, direct LDL, and LFT.  3. Fatigue: Much improved as outlined below.  Tolerating lower dose metoprolol without issue.  4. Rash: Persists despite discontinuation of Imdur, metoprolol, and Lipitor.  She has since resumed Lipitor and lower dose metoprolol without change in rash.  It has been felt the rash is related to anxiety.  Her Wellbutrin was recently increased by PCP.  She has asked for a steroid cream to help with the pruritus.  In this setting, I have prescribed her with low-dose  triamcinolone lotion and recommended that she follow-up with her PCP.  5. Depression: Much improved since she has relocated her job.  Managed by PCP.  Disposition: F/u with Dr. Okey Dupre or an APP in 6 months.  Current medicines are reviewed at length with the patient today.  The patient did not have any concerns regarding medicines.  Signed, Eula Listen, PA-C 01/26/2019 3:24 PM     [facility] [place] [address] [place], [place] [address] [phone]

## 2019-01-26 ENCOUNTER — Ambulatory Visit (INDEPENDENT_AMBULATORY_CARE_PROVIDER_SITE_OTHER): Payer: 59 | Admitting: Physician Assistant

## 2019-01-26 ENCOUNTER — Encounter: Payer: Self-pay | Admitting: Physician Assistant

## 2019-01-26 VITALS — BP 112/76 | HR 83 | Ht 63.0 in | Wt 198.5 lb

## 2019-01-26 DIAGNOSIS — R5383 Other fatigue: Secondary | ICD-10-CM | POA: Diagnosis not present

## 2019-01-26 DIAGNOSIS — R21 Rash and other nonspecific skin eruption: Secondary | ICD-10-CM

## 2019-01-26 DIAGNOSIS — E782 Mixed hyperlipidemia: Secondary | ICD-10-CM

## 2019-01-26 DIAGNOSIS — E781 Pure hyperglyceridemia: Secondary | ICD-10-CM

## 2019-01-26 DIAGNOSIS — F32A Depression, unspecified: Secondary | ICD-10-CM

## 2019-01-26 DIAGNOSIS — I2510 Atherosclerotic heart disease of native coronary artery without angina pectoris: Secondary | ICD-10-CM | POA: Diagnosis not present

## 2019-01-26 DIAGNOSIS — F329 Major depressive disorder, single episode, unspecified: Secondary | ICD-10-CM

## 2019-01-26 MED ORDER — METOPROLOL SUCCINATE ER 25 MG PO TB24: 12.5000 mg | ORAL_TABLET | Freq: Every day | ORAL | 3 refills | Status: DC

## 2019-01-26 MED ORDER — TRIAMCINOLONE ACETONIDE 0.025 % EX OINT: 1.0000 "application " | TOPICAL_OINTMENT | CUTANEOUS | 3 refills | Status: DC | PRN

## 2019-01-26 NOTE — Patient Instructions (Signed)
Medication Instructions:  Your physician has recommended you make the following change in your medication:  1- DECREASE Toprol to 0.5 tablet (12.5 mg) once daily 2- START Triamcinolone acetonide ointment as needed for rash   If you need a refill on your cardiac medications before your next appointment, please call your pharmacy.   Lab work: Your physician recommends that you return for lab work today.  If you have labs (blood work) drawn today and your tests are completely normal, you will receive your results only by: Marland Kitchen MyChart Message (if you have MyChart) OR . A paper copy in the mail If you have any lab test that is abnormal or we need to change your treatment, we will call you to review the results.  Testing/Procedures: None ordered   Follow-Up: At [facility], you and your health needs are our priority.  As part of our continuing mission to provide you with exceptional heart care, we have created designated Provider Care Teams.  These Care Teams include your primary Cardiologist (physician) and Advanced Practice Providers (APPs -  Physician Assistants and Nurse Practitioners) who all work together to provide you with the care you need, when you need it. You will need a follow up appointment in 6 months.  Please call our office 2 months in advance to schedule this appointment.  You may see Yvonne Kendall, MD or Eula Listen, PA-C.

## 2019-01-27 ENCOUNTER — Telehealth: Payer: Self-pay

## 2019-01-27 DIAGNOSIS — E782 Mixed hyperlipidemia: Secondary | ICD-10-CM

## 2019-01-27 DIAGNOSIS — I2510 Atherosclerotic heart disease of native coronary artery without angina pectoris: Secondary | ICD-10-CM

## 2019-01-27 LAB — HEPATIC FUNCTION PANEL
ALK PHOS: 80 IU/L (ref 39–117)
ALT: 29 IU/L (ref 0–32)
AST: 23 IU/L (ref 0–40)
Albumin: 4.3 g/dL (ref 3.8–4.9)
Bilirubin Total: <0.2 mg/dL (ref 0.0–1.2)
Bilirubin, Direct: 0.09 mg/dL (ref 0.00–0.40)
Total Protein: 6.5 g/dL (ref 6.0–8.5)

## 2019-01-27 LAB — LIPID PANEL
CHOLESTEROL TOTAL: 161 mg/dL (ref 100–199)
Chol/HDL Ratio: 5 ratio — ABNORMAL HIGH (ref 0.0–4.4)
HDL: 32 mg/dL — AB (ref >39)
TRIGLYCERIDES: 587 mg/dL — AB (ref 0–149)

## 2019-01-27 LAB — LDL CHOLESTEROL, DIRECT: LDL Direct: 56 mg/dL (ref 0–99)

## 2019-01-27 NOTE — Telephone Encounter (Signed)
Call to patient to review lab results. She verbalized understanding and wants to attempt dieting along with current medications to improve cholesterol levels. She is agreeable to repeat lab work and prefers to come to clinic for it.   Advised pt to call for any further questions or concerns.  Order placed for lipid panel.   Routed to scheduling.

## 2019-01-27 NOTE — Telephone Encounter (Signed)
-----   Message from Sondra Barges, PA-C sent at 01/27/2019 10:24 AM EST ----- Liver function is normal.  Random lipid panel continued to show elevated triglyceride levels.  Direct LDL showed bad cholesterol is at goal.   Regarding her elevated triglyceride level, we have two options.  1) Decrease sugary drinks and foods/carbs with recheck fasting lipid panel in 8 weeks.  2) Decrease sugary drinks, foods/carbs and start Vascepa 2 g bid with recheck fasting lipid panel in 8 weeks.

## 2019-01-28 NOTE — Telephone Encounter (Signed)
LIPID DUE IN April WILL CALL WHEN SCHEDULE AVAILABLE

## 2019-03-19 ENCOUNTER — Telehealth: Payer: Self-pay | Admitting: Internal Medicine

## 2019-03-19 NOTE — Telephone Encounter (Signed)
Ok to defer

## 2019-03-19 NOTE — Telephone Encounter (Signed)
This should be ok to defer. I will make provider aware.

## 2019-03-19 NOTE — Telephone Encounter (Signed)
Patient declined to go to [facility] for labs next week.  She would like to call back to reschedule in May or June.  Recall appt due in August .    Is this ok for patient to wait.  Please advise if not ok

## 2019-03-22 NOTE — Telephone Encounter (Signed)
Patient contacted and made aware it is ok to defer her lab work. She was very appreciative and will call back in June to see if she can schedule at that time.

## 2019-03-26 ENCOUNTER — Other Ambulatory Visit: Payer: 59

## 2019-06-07 ENCOUNTER — Telehealth: Payer: Self-pay

## 2019-06-07 NOTE — Telephone Encounter (Signed)
Patient called saying she is having swelling in her ankles. She has cut back on salt and keeping them elevated as much as possible and still having this issue.   Wants to know if you need to see her or do you recommend anything else?  Please Advise.

## 2019-06-07 NOTE — Telephone Encounter (Signed)
If she has been out in the heat, having her legs down and not drinking much plain water this could be the cause.  Depending on how long it has been present, I will probably need to see her to evaluate.

## 2019-06-07 NOTE — Telephone Encounter (Signed)
Called and spoke with pt. Informed of this. She scheduled appt to be seen next week about swelling.

## 2019-06-16 ENCOUNTER — Ambulatory Visit: Payer: 59 | Admitting: Internal Medicine

## 2019-06-16 ENCOUNTER — Encounter: Payer: Self-pay | Admitting: Internal Medicine

## 2019-06-16 ENCOUNTER — Other Ambulatory Visit: Payer: Self-pay

## 2019-06-16 VITALS — BP 108/72 | HR 80 | Ht 63.0 in | Wt 203.0 lb

## 2019-06-16 DIAGNOSIS — F321 Major depressive disorder, single episode, moderate: Secondary | ICD-10-CM

## 2019-06-16 DIAGNOSIS — R609 Edema, unspecified: Secondary | ICD-10-CM | POA: Diagnosis not present

## 2019-06-16 DIAGNOSIS — G43009 Migraine without aura, not intractable, without status migrainosus: Secondary | ICD-10-CM

## 2019-06-16 MED ORDER — ESCITALOPRAM OXALATE 10 MG PO TABS: 10.0000 mg | ORAL_TABLET | Freq: Every day | ORAL | 1 refills | Status: DC

## 2019-06-16 MED ORDER — SUMATRIPTAN SUCCINATE 100 MG PO TABS: 100.0000 mg | ORAL_TABLET | ORAL | 5 refills | Status: DC | PRN

## 2019-06-16 MED ORDER — EMGALITY 120 MG/ML ~~LOC~~ SOAJ: 120.0000 mg | SUBCUTANEOUS | 1 refills | Status: DC

## 2019-06-16 NOTE — Progress Notes (Signed)
Date:  06/16/2019   Name:  Nancy Skinner   DOB:  Oct 27, 1966   MRN:  [id number]   Chief Complaint: Edema (Both ankles swelling. Pedicure every 2 weeks. Heels worn due to back pain. Started 2 weeks ago. ), Migraine (Needs RF on sumatriptan.), and Depression (16- PHQ9 )  Depression        This is a new problem.  The onset quality is gradual.   Associated symptoms include decreased concentration, fatigue, hopelessness, decreased interest, headaches and sad.  Associated symptoms include no suicidal ideas.     The symptoms are aggravated by work stress and social issues.  Treatments tried: on bupropion.  Compliance with treatment is good. Migraine  This is a recurrent problem. The problem occurs intermittently. The pain is located in the occipital region. Associated symptoms include photophobia, a visual change and vomiting (with a migraine at times). Pertinent negatives include no abdominal pain, coughing, dizziness or weakness. She has tried triptans for the symptoms. The treatment provided significant relief.  Edema - noted 2 weeks ago from nail technician.  She has cut out salt and is drinking more water.  She does spend her whole day at the computer with limited physical activity.  Review of Systems  Constitutional: Positive for fatigue and unexpected weight change. Negative for chills and diaphoresis.  Eyes: Positive for photophobia.  Respiratory: Negative for cough, chest tightness, shortness of breath and wheezing.   Cardiovascular: Positive for leg swelling. Negative for chest pain and palpitations.  Gastrointestinal: Positive for vomiting (with a migraine at times). Negative for abdominal pain.  Neurological: Positive for headaches. Negative for dizziness, tremors and weakness.  Psychiatric/Behavioral: Positive for decreased concentration, depression, dysphoric mood and sleep disturbance. Negative for suicidal ideas. The patient is nervous/anxious.     Patient Active Problem List   Diagnosis Date Noted  . Coronary artery disease involving native heart 01/18/2019  . Generalized anxiety disorder 01/18/2019  . Neutrophilic leukocytosis 08/09/2018  . Pulmonary nodule less than 6 cm determined by computed tomography of lung 07/30/2018  . Dyslipidemia 07/16/2018  . Night sweats 07/16/2018  . Neck muscle spasm 07/15/2018  . Mixed hyperlipidemia 07/15/2018  . Tobacco use disorder 07/15/2018  . Migraine without aura and without status migrainosus, not intractable 06/24/2018  . Gastroesophageal reflux disease without esophagitis 06/24/2018  . Chronic midline low back pain without sciatica 06/24/2018    Allergies  Allergen Reactions  . Dye Fdc Red [Red Dye] Other (See Comments)    Severe migraine    Past Surgical History:  Procedure Laterality Date  . ABDOMINAL HYSTERECTOMY  2012   partial for bleeding  . BREAST BIOPSY Right    neg  . BREAST BIOPSY Left 2015   4 bxs- neg  . BREAST SURGERY Left    multiple biopsies  . CATARACT EXTRACTION Right 12/19/2010  . CHOLECYSTECTOMY  1999  . INTRAVASCULAR PRESSURE WIRE/FFR STUDY N/A 07/22/2018   Procedure: INTRAVASCULAR PRESSURE WIRE/FFR STUDY;  Surgeon: Yvonne KendallEnd, Christopher, MD;  Location: [facility];  Service: Cardiovascular;  Laterality: N/A;  . LEFT HEART CATH AND CORONARY ANGIOGRAPHY N/A 07/22/2018   Procedure: LEFT HEART CATH AND CORONARY ANGIOGRAPHY;  Surgeon: Yvonne KendallEnd, Christopher, MD;  Location: [facility];  Service: Cardiovascular;  Laterality: N/A;  . POSTERIOR LUMBAR FUSION 4 LEVEL  2000    Social History   Tobacco Use  . Smoking status: Current Every Day Smoker    Packs/day: 0.25    Years: 40.00    Pack years:  10.00    Types: Cigarettes  . Smokeless tobacco: Never Used  . Tobacco comment: not smoked for three days  Substance Use Topics  . Alcohol use: Yes    Comment: less than once a week.  . Drug use: Never     Medication list has been reviewed and updated.  Current Meds  Medication  Sig  . aspirin EC 81 MG tablet Take 81 mg by mouth daily.  . Aspirin-Acetaminophen (GOODYS BODY PAIN PO) Take 1 packet by mouth daily. Every day.  Marland Kitchen. buPROPion (WELLBUTRIN XL) 300 MG 24 hr tablet Take 1 tablet (300 mg total) by mouth daily.  . cyclobenzaprine (FLEXERIL) 10 MG tablet Take 1 tablet (10 mg total) by mouth 3 (three) times daily as needed for muscle spasms.  . Esomeprazole Magnesium (NEXIUM PO) Take 20 mg by mouth daily.   . Lidocaine 4 % PTCH Apply 1 patch topically daily as needed (pain).   . metoprolol succinate (TOPROL-XL) 25 MG 24 hr tablet Take 0.5 tablets (12.5 mg total) by mouth daily. Take with or immediately following a meal.  . Multiple Vitamin (MULTIVITAMIN) capsule Take 1 capsule by mouth daily.  . nitroGLYCERIN (NITROSTAT) 0.4 MG SL tablet Place 1 tablet (0.4 mg total) under the tongue every 5 (five) minutes as needed for chest pain.  . SUMAtriptan (IMITREX) 100 MG tablet Take 1 tablet (100 mg total) by mouth every 2 (two) hours as needed for migraine. May repeat in 2 hours if headache persists or recurs.  . triamcinolone (KENALOG) 0.025 % ointment Apply 1 application topically as needed. For rash  . [DISCONTINUED] SUMAtriptan (IMITREX) 100 MG tablet Take 1 tablet (100 mg total) by mouth every 2 (two) hours as needed for migraine. May repeat in 2 hours if headache persists or recurs.    PHQ 2/9 Scores 06/16/2019 01/18/2019 06/24/2018  PHQ - 2 Score 6 2 0  PHQ- 9 Score 16 6 -    BP Readings from Last 3 Encounters:  06/16/19 108/72  01/26/19 112/76  01/18/19 118/80    Physical Exam Vitals signs and nursing note reviewed.  Constitutional:      General: She is not in acute distress.    Appearance: She is well-developed.  HENT:     Head: Normocephalic and atraumatic.  Neck:     Musculoskeletal: Normal range of motion and neck supple.     Thyroid: No thyroid mass.  Cardiovascular:     Rate and Rhythm: Normal rate and regular rhythm.     Pulses: Normal pulses.      Heart sounds: No murmur.     Comments: Trace ankle edema bilaterally No cord or calf tenderness Pulmonary:     Effort: Pulmonary effort is normal. No respiratory distress.     Breath sounds: No wheezing or rhonchi.  Musculoskeletal: Normal range of motion.  Lymphadenopathy:     Cervical: No cervical adenopathy.  Skin:    General: Skin is warm and dry.     Findings: No rash.  Neurological:     Mental Status: She is alert and oriented to person, place, and time.  Psychiatric:        Attention and Perception: Attention normal.        Mood and Affect: Mood is depressed. Affect is tearful.        Behavior: Behavior normal.        Thought Content: Thought content normal. Thought content does not include suicidal ideation. Thought content does not include suicidal plan.  Cognition and Memory: Cognition normal.        Judgment: Judgment normal.     Wt Readings from Last 3 Encounters:  06/16/19 203 lb (92.1 kg)  01/26/19 198 lb 8 oz (90 kg)  01/18/19 195 lb (88.5 kg)    BP 108/72   Pulse 80   Ht 5\' 3"  (1.6 m)   Wt 203 lb (92.1 kg)   SpO2 96%   BMI 35.96 kg/m   Assessment and Plan: 1. Migraine without aura and without status migrainosus, not intractable Sent home with loading dose of Emgality  240 mg then start 120 mg every 30 days - SUMAtriptan (IMITREX) 100 MG tablet; Take 1 tablet (100 mg total) by mouth every 2 (two) hours as needed for migraine. May repeat in 2 hours if headache persists or recurs.  Dispense: 10 tablet; Refill: 5 - Galcanezumab-gnlm (EMGALITY) 120 MG/ML SOAJ; Inject 120 mg into the skin every 30 (thirty) days.  Dispense: 3 pen; Refill: 1  2. Current moderate episode of major depressive disorder without prior episode (HCC) Begin lexapro Continue bupropion Follow up one month - escitalopram (LEXAPRO) 10 MG tablet; Take 1 tablet (10 mg total) by mouth daily.  Dispense: 30 tablet; Refill: 1  3. Dependent edema Very minimal - encourage more water, less  sodium and more physical activity   Partially dictated using Editor, commissioning. Any errors are unintentional.  Halina Maidens, MD [facility]  06/16/2019

## 2019-06-16 NOTE — Patient Instructions (Addendum)
Start Lexapro 1/2 tablet daily for 4 days then take one whole tablet per day.  Your next dose of Emgality is one injection.

## 2019-07-15 ENCOUNTER — Other Ambulatory Visit: Payer: Self-pay | Admitting: Internal Medicine

## 2019-07-15 DIAGNOSIS — F17200 Nicotine dependence, unspecified, uncomplicated: Secondary | ICD-10-CM

## 2019-08-03 ENCOUNTER — Encounter: Payer: Self-pay | Admitting: Internal Medicine

## 2019-08-03 ENCOUNTER — Other Ambulatory Visit: Payer: Self-pay

## 2019-08-03 ENCOUNTER — Ambulatory Visit (INDEPENDENT_AMBULATORY_CARE_PROVIDER_SITE_OTHER): Payer: 59 | Admitting: Internal Medicine

## 2019-08-03 VITALS — BP 128/78 | HR 78 | Ht 63.0 in | Wt 197.0 lb

## 2019-08-03 DIAGNOSIS — M533 Sacrococcygeal disorders, not elsewhere classified: Secondary | ICD-10-CM

## 2019-08-03 DIAGNOSIS — F321 Major depressive disorder, single episode, moderate: Secondary | ICD-10-CM

## 2019-08-03 DIAGNOSIS — E782 Mixed hyperlipidemia: Secondary | ICD-10-CM | POA: Diagnosis not present

## 2019-08-03 DIAGNOSIS — G43009 Migraine without aura, not intractable, without status migrainosus: Secondary | ICD-10-CM

## 2019-08-03 DIAGNOSIS — F418 Other specified anxiety disorders: Secondary | ICD-10-CM | POA: Diagnosis not present

## 2019-08-03 MED ORDER — ESCITALOPRAM OXALATE 10 MG PO TABS: 10.0000 mg | ORAL_TABLET | Freq: Every day | ORAL | 5 refills | Status: DC

## 2019-08-03 MED ORDER — ATORVASTATIN CALCIUM 40 MG PO TABS: 40.0000 mg | ORAL_TABLET | Freq: Every day | ORAL | 11 refills | Status: DC

## 2019-08-03 NOTE — Progress Notes (Signed)
Date:  08/03/2019   Name:  Nancy Skinner   DOB:  [birth year]   MRN:  [id number]   Chief Complaint: Depression (Follow up. 17- PHQ9. Patient lost her job 2 weeks ago and is stressed. Has two possible job oppurtunities. )  Depression        This is a new problem.  The current episode started more than 1 month ago.   The problem has been gradually improving (but with recent set backs having more negative sx) since onset.  Associated symptoms include headaches (much improved with emgality).  Associated symptoms include no fatigue, does not have insomnia and no suicidal ideas.     The symptoms are aggravated by work stress (lost her job).  Past treatments include SSRIs - Selective serotonin reuptake inhibitors (lexapro started last visit; already on bupropion).  Compliance with treatment is good.  Previous treatment provided moderate relief.  Past medical history includes anxiety.   Anxiety Presents for follow-up visit. Symptoms include depressed mood. Patient reports no chest pain, dizziness, insomnia, nervous/anxious behavior, palpitations, shortness of breath or suicidal ideas. Symptoms occur most days.   Compliance with medications is 76-100%.  Migraine  This is a recurrent problem. The problem has been rapidly improving (only had 2 migraines in the past 6 weeks). The pain quality is similar to prior headaches. Associated symptoms include back pain (left SI region). Pertinent negatives include no dizziness, fever, insomnia, numbness, tingling or weakness. Treatments tried: Emgality started last visit - did very well. The treatment provided significant [employer] though and will not be able to afford it until she gets a new job) relief.  Back Pain This is a recurrent problem. The pain is present in the sacro-iliac. The quality of the pain is described as aching and shooting. The pain does not radiate. The symptoms are aggravated by twisting and stress. Associated symptoms include headaches  (much improved with emgality). Pertinent negatives include no chest pain, fever, numbness, perianal numbness, tingling or weakness. She has tried ice, heat and home exercises for the symptoms. The treatment provided moderate relief.    Review of Systems  Constitutional: Positive for unexpected weight change. Negative for chills, fatigue and fever.  HENT: Congestion: dieting to lose weight.   Respiratory: Negative for chest tightness, shortness of breath and wheezing.   Cardiovascular: Negative for chest pain, palpitations and leg swelling.  Gastrointestinal: Negative for blood in stool, constipation and diarrhea.  Musculoskeletal: Positive for back pain (left SI region).  Neurological: Positive for headaches (much improved with emgality). Negative for dizziness, tingling, weakness and numbness.  Hematological: Negative for adenopathy.  Psychiatric/Behavioral: Positive for depression, dysphoric mood (due to work situation) and sleep disturbance. Negative for suicidal ideas. The patient is not nervous/anxious and does not have insomnia.     Patient Active Problem List   Diagnosis Date Noted  . Coronary artery disease involving native heart 01/18/2019  . Depression with anxiety 01/18/2019  . Neutrophilic leukocytosis 78/24/2353  . Pulmonary nodule less than 6 cm determined by computed tomography of lung 07/30/2018  . Dyslipidemia 07/16/2018  . Night sweats 07/16/2018  . Neck muscle spasm 07/15/2018  . Mixed hyperlipidemia 07/15/2018  . Tobacco use disorder 07/15/2018  . Migraine without aura and without status migrainosus, not intractable 06/24/2018  . Gastroesophageal reflux disease without esophagitis 06/24/2018  . Chronic midline low back pain without sciatica 06/24/2018    Allergies  Allergen Reactions  . Oxycodone Other (See Comments)    "Black outs" and confusion  .  Dye Fdc Red [Red Dye] Other (See Comments)    Severe migraine    Past Surgical History:  Procedure Laterality  Date  . ABDOMINAL HYSTERECTOMY  2012   partial for bleeding  . BREAST BIOPSY Right    neg  . BREAST BIOPSY Left 2015   4 bxs- neg  . BREAST SURGERY Left    multiple biopsies  . CATARACT EXTRACTION Right 12/19/2010  . CHOLECYSTECTOMY  1999  . INTRAVASCULAR PRESSURE WIRE/FFR STUDY N/A 07/22/2018   Procedure: INTRAVASCULAR PRESSURE WIRE/FFR STUDY;  Surgeon: Yvonne KendallEnd, Christopher, MD;  Location: [facility];  Service: Cardiovascular;  Laterality: N/A;  . LEFT HEART CATH AND CORONARY ANGIOGRAPHY N/A 07/22/2018   Procedure: LEFT HEART CATH AND CORONARY ANGIOGRAPHY;  Surgeon: Yvonne KendallEnd, Christopher, MD;  Location: [facility];  Service: Cardiovascular;  Laterality: N/A;  . POSTERIOR LUMBAR FUSION 4 LEVEL  2000    Social History   Tobacco Use  . Smoking status: Current Every Day Smoker    Packs/day: 0.25    Years: 40.00    Pack years: 10.00    Types: Cigarettes  . Smokeless tobacco: Never Used  . Tobacco comment: not smoked for three days  Substance Use Topics  . Alcohol use: Yes    Comment: less than once a week.  . Drug use: Never     Medication list has been reviewed and updated.  Current Meds  Medication Sig  . aspirin EC 81 MG tablet Take 81 mg by mouth daily.  . Aspirin-Acetaminophen (GOODYS BODY PAIN PO) Take 1 packet by mouth daily. Every day.  Marland Kitchen. atorvastatin (LIPITOR) 40 MG tablet Take 1 tablet (40 mg total) by mouth daily.  Marland Kitchen. buPROPion (WELLBUTRIN XL) 300 MG 24 hr tablet TAKE 1 TABLET(300 MG) BY MOUTH DAILY  . cyclobenzaprine (FLEXERIL) 10 MG tablet Take 1 tablet (10 mg total) by mouth 3 (three) times daily as needed for muscle spasms.  Marland Kitchen. escitalopram (LEXAPRO) 10 MG tablet Take 1 tablet (10 mg total) by mouth daily.  . Esomeprazole Magnesium (NEXIUM PO) Take 20 mg by mouth daily.   . Galcanezumab-gnlm (EMGALITY) 120 MG/ML SOAJ Inject 120 mg into the skin every 30 (thirty) days.  . isosorbide mononitrate (IMDUR) 30 MG 24 hr tablet Take 1 tablet by mouth daily.  .  Lidocaine 4 % PTCH Apply 1 patch topically daily as needed (pain).   . metoprolol succinate (TOPROL-XL) 25 MG 24 hr tablet Take 0.5 tablets (12.5 mg total) by mouth daily. Take with or immediately following a meal.  . Multiple Vitamin (MULTIVITAMIN) capsule Take 1 capsule by mouth daily.  . nitroGLYCERIN (NITROSTAT) 0.4 MG SL tablet Place 1 tablet (0.4 mg total) under the tongue every 5 (five) minutes as needed for chest pain.  . SUMAtriptan (IMITREX) 100 MG tablet Take 1 tablet (100 mg total) by mouth every 2 (two) hours as needed for migraine. May repeat in 2 hours if headache persists or recurs.  . triamcinolone (KENALOG) 0.025 % ointment Apply 1 application topically as needed. For rash    PHQ 2/9 Scores 08/03/2019 06/16/2019 01/18/2019 06/24/2018  PHQ - 2 Score 2 6 2  0  PHQ- 9 Score 17 16 6  -   GAD 7 : Generalized Anxiety Score 01/18/2019  Nervous, Anxious, on Edge 3  Control/stop worrying 3  Worry too much - different things 3  Trouble relaxing 3  Restless 3  Easily annoyed or irritable 3  Afraid - awful might happen 3  Total GAD 7 Score  21  Anxiety Difficulty Extremely difficult     BP Readings from Last 3 Encounters:  08/03/19 128/78  06/16/19 108/72  01/26/19 112/76    Physical Exam Vitals signs and nursing note reviewed.  Constitutional:      General: She is not in acute distress.    Appearance: Normal appearance. She is well-developed.  HENT:     Head: Normocephalic and atraumatic.  Neck:     Musculoskeletal: Normal range of motion.  Cardiovascular:     Rate and Rhythm: Normal rate and regular rhythm.     Pulses: Normal pulses.  Pulmonary:     Effort: Pulmonary effort is normal. No respiratory distress.     Breath sounds: No wheezing or rhonchi.  Musculoskeletal: Normal range of motion.     Lumbar back: She exhibits tenderness (left SI joint). She exhibits no edema and no spasm.     Right lower leg: No edema.     Left lower leg: No edema.  Lymphadenopathy:      Cervical: No cervical adenopathy.  Skin:    General: Skin is warm and dry.     Capillary Refill: Capillary refill takes less than 2 seconds.     Findings: No rash.  Neurological:     General: No focal deficit present.     Mental Status: She is alert and oriented to person, place, and time.     Sensory: Sensation is intact.     Motor: Motor function is intact.     Gait: Gait is intact.  Psychiatric:        Attention and Perception: Attention normal.        Mood and Affect: Mood normal.        Behavior: Behavior normal.        Thought Content: Thought content normal.     Wt Readings from Last 3 Encounters:  08/03/19 197 lb (89.4 kg)  06/16/19 203 lb (92.1 kg)  01/26/19 198 lb 8 oz (90 kg)    BP 128/78   Pulse 78   Ht 5\' 3"  (1.6 m)   Wt 197 lb (89.4 kg)   SpO2 98%   BMI 34.90 kg/m   Assessment and Plan: 1. Depression with anxiety Improving with lexapro - no side effects, headache or nausea Mild set back with job loss but coping well, looking forward to her wedding in October Will continue Lexapro 10 mg daily  2. Migraine without aura and without status migrainosus, not intractable Excellent response to Emgality without any side effects Migraines decreased from 2-3 times per week to 1 time per month Will try to obtain samples to avoid interruption in treatment  3. Current moderate episode of major depressive disorder without prior episode (HCC) Doing well as above - escitalopram (LEXAPRO) 10 MG tablet; Take 1 tablet (10 mg total) by mouth daily.  Dispense: 30 tablet; Refill: 5  4. Mixed hyperlipidemia Continue statin therapy - atorvastatin (LIPITOR) 40 MG tablet; Take 1 tablet (40 mg total) by mouth daily.  Dispense: 30 tablet; Refill: 11  5. Sacro-iliac pain Recurrent episodic pain due to inflammation and stress Continue ice or heat as needed Pt reminded to do core strengthening exercises as taught previously May take nsiad or tramadol PRN for severe pain    Partially dictated using Animal nutritionistDragon software. Any errors are unintentional.  Bari EdwardLaura Berglund, MD [facility]  08/03/2019

## 2019-09-02 ENCOUNTER — Ambulatory Visit (INDEPENDENT_AMBULATORY_CARE_PROVIDER_SITE_OTHER): Payer: 59 | Admitting: Internal Medicine

## 2019-09-02 ENCOUNTER — Encounter: Payer: Self-pay | Admitting: Internal Medicine

## 2019-09-02 ENCOUNTER — Other Ambulatory Visit: Payer: Self-pay

## 2019-09-02 VITALS — BP 90/60 | HR 77 | Ht 63.0 in | Wt 194.5 lb

## 2019-09-02 DIAGNOSIS — E782 Mixed hyperlipidemia: Secondary | ICD-10-CM

## 2019-09-02 DIAGNOSIS — I2510 Atherosclerotic heart disease of native coronary artery without angina pectoris: Secondary | ICD-10-CM | POA: Diagnosis not present

## 2019-09-02 NOTE — Progress Notes (Signed)
Follow-up Outpatient Visit Date: 09/02/2019  Primary Care Provider: Glean Hess, [place] [address]  Chief Complaint: Follow-up CAD  HPI:  Nancy Skinner is a 53 y.o. year-old female with history of nonobstructive coronary artery disease, hyperlipidemia, migraine disorder, and depression, who presents for follow-up of coronary artery disease.  She was last seen in our office by Christell Faith, PA in February.  At that time she was doing well from a heart standpoint.  She had been bothered by headaches that improved with discontinuation of isosorbide mononitrate.  She also developed a rash on her right forearm that was attributed to atorvastatin.  However, after restarting the medication, the rash did not recur.  She continued to note some fatigue, prompting de-escalation of metoprolol succinate to 12.5 mg daily.  Today, Nancy Skinner reports feeling quite well.  She still has some fatigue, though she notes that her energy has improved since metoprolol was decreased.  She began using Emgality for migraine headaches in July and has noticed significant improvement.  Only side effect is transient injection site pain.  --------------------------------------------------------------------------------------------------  Past Medical History:  Diagnosis Date  . Coronary artery disease, non-occlusive    a. LHC 07/22/18: LM nl, mLAD 60% w/ FFR 0.83, LCx nl, RCA nl, EF 55-65%, nl LVEDP, no AS.  . Depression   . Lumbar disc disease   . Migraines   . Mixed hyperlipidemia    Past Surgical History:  Procedure Laterality Date  . ABDOMINAL HYSTERECTOMY  2012   partial for bleeding  . BREAST BIOPSY Right    neg  . BREAST BIOPSY Left 2015   4 bxs- neg  . BREAST SURGERY Left    multiple biopsies  . CATARACT EXTRACTION Right 12/19/2010  . CHOLECYSTECTOMY  1999  . INTRAVASCULAR PRESSURE WIRE/FFR STUDY N/A 07/22/2018   Procedure: INTRAVASCULAR PRESSURE WIRE/FFR STUDY;  Surgeon:  Nelva Bush, MD;  Location: [facility];  Service: Cardiovascular;  Laterality: N/A;  . LEFT HEART CATH AND CORONARY ANGIOGRAPHY N/A 07/22/2018   Procedure: LEFT HEART CATH AND CORONARY ANGIOGRAPHY;  Surgeon: Nelva Bush, MD;  Location: [facility];  Service: Cardiovascular;  Laterality: N/A;  . POSTERIOR LUMBAR FUSION 4 LEVEL  2000    Current Meds  Medication Sig  . aspirin EC 81 MG tablet Take 81 mg by mouth daily.  . Aspirin-Acetaminophen (GOODYS BODY PAIN PO) Take 1 packet by mouth daily. Every day.  Marland Kitchen atorvastatin (LIPITOR) 40 MG tablet Take 1 tablet (40 mg total) by mouth daily.  Marland Kitchen buPROPion (WELLBUTRIN XL) 300 MG 24 hr tablet TAKE 1 TABLET(300 MG) BY MOUTH DAILY  . cyclobenzaprine (FLEXERIL) 10 MG tablet Take 1 tablet (10 mg total) by mouth 3 (three) times daily as needed for muscle spasms.  Marland Kitchen escitalopram (LEXAPRO) 10 MG tablet Take 1 tablet (10 mg total) by mouth daily.  . Esomeprazole Magnesium (NEXIUM PO) Take 20 mg by mouth daily.   . Galcanezumab-gnlm (EMGALITY) 120 MG/ML SOAJ Inject 120 mg into the skin every 30 (thirty) days.  . isosorbide mononitrate (IMDUR) 30 MG 24 hr tablet Take 1 tablet by mouth daily.  . Lidocaine 4 % PTCH Apply 1 patch topically daily as needed (pain).   . metoprolol succinate (TOPROL-XL) 25 MG 24 hr tablet Take 0.5 tablets (12.5 mg total) by mouth daily. Take with or immediately following a meal.  . Multiple Vitamin (MULTIVITAMIN) capsule Take 1 capsule by mouth daily.  . nitroGLYCERIN (NITROSTAT) 0.4 MG  SL tablet Place 1 tablet (0.4 mg total) under the tongue every 5 (five) minutes as needed for chest pain.  . SUMAtriptan (IMITREX) 100 MG tablet Take 1 tablet (100 mg total) by mouth every 2 (two) hours as needed for migraine. May repeat in 2 hours if headache persists or recurs.  . triamcinolone (KENALOG) 0.025 % ointment Apply 1 application topically as needed. For rash    Allergies: Oxycodone and Dye fdc red [red dye]   Social History   Tobacco Use  . Smoking status: Current Every Day Smoker    Packs/day: 0.25    Years: 40.00    Pack years: 10.00    Types: Cigarettes  . Smokeless tobacco: Never Used  . Tobacco comment: not smoked for three days  Substance Use Topics  . Alcohol use: Yes    Comment: less than once a week.  . Drug use: Never    Family History  Problem Relation Age of Onset  . Breast cancer Mother 1150  . Heart attack Father 550  . Alcohol abuse Father   . Breast cancer Maternal Grandmother   . Breast cancer Paternal Grandmother     Review of Systems: A 12-system review of systems was performed and was negative except as noted in the HPI.  --------------------------------------------------------------------------------------------------  Physical Exam: BP 90/60 (BP Location: Left Arm, Patient Position: Sitting, Cuff Size: Normal)   Pulse 77   Ht 5\' 3"  (1.6 m)   Wt 194 lb 8 oz (88.2 kg)   SpO2 98%   BMI 34.45 kg/m   General: NAD. HEENT: No conjunctival pallor or scleral icterus.  Facemask in place. Neck: Supple without lymphadenopathy, thyromegaly, JVD, or HJR. Lungs: Normal work of breathing. Clear to auscultation bilaterally without wheezes or crackles. Heart: Regular rate and rhythm without murmurs, rubs, or gallops. Non-displaced PMI. Abd: Bowel sounds present. Soft, NT/ND without hepatosplenomegaly Ext: No lower extremity edema. Radial, PT, and DP pulses are 2+ bilaterally. Skin: Warm and dry without rash.  EKG: Normal sinus rhythm with borderline low voltage.  No significant abnormality.  Lab Results  Component Value Date   WBC 13.7 (H) 08/07/2018   HGB 14.1 08/07/2018   HCT 43.4 08/07/2018   MCV 86 08/07/2018   PLT 301 08/07/2018    Lab Results  Component Value Date   NA 138 07/16/2018   K 4.0 07/16/2018   CL 102 07/16/2018   CO2 27 07/16/2018   BUN 13 07/16/2018   CREATININE 0.71 07/16/2018   GLUCOSE 103 (H) 07/16/2018   ALT 29 01/26/2019     Lab Results  Component Value Date   CHOL 161 01/26/2019   HDL 32 (L) 01/26/2019   LDLCALC Comment 01/26/2019   LDLDIRECT 56 01/26/2019   TRIG 587 (HH) 01/26/2019   CHOLHDL 5.0 (H) 01/26/2019    --------------------------------------------------------------------------------------------------  ASSESSMENT AND PLAN: Nonobstructive coronary artery disease: Nancy Skinner has not experienced any chest pain or significant dyspnea.  Energy improved with de-escalation of metoprolol.  Headaches also improved with discontinuation of isosorbide mononitrate.  We will continue current medications to prevent progression of disease.  We will repeat lipid panel to assess response to statin therapy.  Hyperlipidemia: Check fasting lipid panel today with direct LDL, given elevated triglycerides in the past, as well as CMP.  Continue atorvastatin 40 mg daily for now.  Follow-up: Return to clinic in 12 months.  Yvonne Kendallhristopher Cameo Schmiesing, MD 09/02/2019 8:24 AM

## 2019-09-02 NOTE — Patient Instructions (Signed)
Medication Instructions:  Your physician recommends that you continue on your current medications as directed. Please refer to the Current Medication list given to you today. If you need a refill on your cardiac medications before your next appointment, please call your pharmacy.   Lab work: Your physician recommends that you have lab work today(CMET, Lipid with direct LDL) If you have labs (blood work) drawn today and your tests are completely normal, you will receive your results only by: Marland Kitchen MyChart Message (if you have MyChart) OR . A paper copy in the mail If you have any lab test that is abnormal or we need to change your treatment, we will call you to review the results.  Testing/Procedures: None ordered   Follow-Up: At [facility], you and your health needs are our priority.  As part of our continuing mission to provide you with exceptional heart care, we have created designated Provider Care Teams.  These Care Teams include your primary Cardiologist (physician) and Advanced Practice Providers (APPs -  Physician Assistants and Nurse Practitioners) who all work together to provide you with the care you need, when you need it. You will need a follow up appointment in 12 months.  Please call our office 2 months in advance to schedule this appointment.  You may see Nelva Bush, MD or one of the following Advanced Practice Providers on your designated Care Team:   Murray Hodgkins, NP Christell Faith, PA-C . Marrianne Mood, PA-C

## 2019-09-03 LAB — LIPID PANEL
Chol/HDL Ratio: 3.9 ratio (ref 0.0–4.4)
Cholesterol, Total: 130 mg/dL (ref 100–199)
HDL: 33 mg/dL — ABNORMAL LOW (ref >39)
LDL Chol Calc (NIH): 50 mg/dL (ref 0–99)
Triglycerides: 307 mg/dL — ABNORMAL HIGH (ref 0–149)
VLDL Cholesterol Cal: 47 mg/dL — ABNORMAL HIGH (ref 5–40)

## 2019-09-03 LAB — COMPREHENSIVE METABOLIC PANEL
ALT: 27 IU/L (ref 0–32)
AST: 18 IU/L (ref 0–40)
Albumin/Globulin Ratio: 1.8 (ref 1.2–2.2)
Albumin: 4.4 g/dL (ref 3.8–4.9)
Alkaline Phosphatase: 94 IU/L (ref 39–117)
BUN/Creatinine Ratio: 13 (ref 9–23)
BUN: 10 mg/dL (ref 6–24)
Bilirubin Total: <0.2 mg/dL (ref 0.0–1.2)
CO2: 22 mmol/L (ref 20–29)
Calcium: 10.2 mg/dL (ref 8.7–10.2)
Chloride: 103 mmol/L (ref 96–106)
Creatinine, Ser: 0.77 mg/dL (ref 0.57–1.00)
GFR calc Af Amer: 103 mL/min/{1.73_m2} (ref >59)
GFR calc non Af Amer: 89 mL/min/{1.73_m2} (ref >59)
Globulin, Total: 2.5 g/dL (ref 1.5–4.5)
Glucose: 139 mg/dL — ABNORMAL HIGH (ref 65–99)
Potassium: 4.9 mmol/L (ref 3.5–5.2)
Sodium: 142 mmol/L (ref 134–144)
Total Protein: 6.9 g/dL (ref 6.0–8.5)

## 2019-09-03 LAB — LDL CHOLESTEROL, DIRECT: LDL Direct: 48 mg/dL (ref 0–99)

## 2019-09-06 ENCOUNTER — Telehealth: Payer: Self-pay | Admitting: *Deleted

## 2019-09-06 MED ORDER — FISH OIL 1000 MG PO CAPS: 2.0000 | ORAL_CAPSULE | Freq: Every day | ORAL | 2 refills | Status: DC

## 2019-09-06 NOTE — Telephone Encounter (Signed)
Results called to pt. Pt verbalized understanding of results, plan of care and to start fish oil 2000 mg by mouth once a day. She will get this OTC.

## 2019-09-06 NOTE — Telephone Encounter (Signed)
-----   Message from Nelva Bush, MD sent at 09/04/2019 11:54 AM EDT ----- Please let Nancy Skinner know that her kidney function, liver function, and electrolytes are normal.  Her triglycerides were moderately elevated but her LDL (bad cholesterol) was very good.  I recommend that she begin taking fish oil 2000 mg daily and work on diet and exercise.  She should continue her current dose of atorvastatin.

## 2019-09-09 ENCOUNTER — Encounter: Payer: Self-pay | Admitting: Internal Medicine

## 2019-10-07 ENCOUNTER — Encounter: Payer: Self-pay | Admitting: Internal Medicine

## 2019-10-08 ENCOUNTER — Encounter: Payer: Self-pay | Admitting: Internal Medicine

## 2019-10-08 ENCOUNTER — Other Ambulatory Visit: Payer: Self-pay

## 2019-10-08 ENCOUNTER — Ambulatory Visit (INDEPENDENT_AMBULATORY_CARE_PROVIDER_SITE_OTHER): Payer: 59 | Admitting: Internal Medicine

## 2019-10-08 VITALS — BP 128/78 | HR 75 | Ht 63.0 in | Wt 190.0 lb

## 2019-10-08 DIAGNOSIS — F17201 Nicotine dependence, unspecified, in remission: Secondary | ICD-10-CM

## 2019-10-08 DIAGNOSIS — Z0000 Encounter for general adult medical examination without abnormal findings: Secondary | ICD-10-CM | POA: Diagnosis not present

## 2019-10-08 DIAGNOSIS — Z1211 Encounter for screening for malignant neoplasm of colon: Secondary | ICD-10-CM | POA: Diagnosis not present

## 2019-10-08 DIAGNOSIS — Z1231 Encounter for screening mammogram for malignant neoplasm of breast: Secondary | ICD-10-CM | POA: Diagnosis not present

## 2019-10-08 DIAGNOSIS — I2510 Atherosclerotic heart disease of native coronary artery without angina pectoris: Secondary | ICD-10-CM | POA: Diagnosis not present

## 2019-10-08 DIAGNOSIS — F418 Other specified anxiety disorders: Secondary | ICD-10-CM

## 2019-10-08 DIAGNOSIS — G43009 Migraine without aura, not intractable, without status migrainosus: Secondary | ICD-10-CM

## 2019-10-08 DIAGNOSIS — E782 Mixed hyperlipidemia: Secondary | ICD-10-CM

## 2019-10-08 NOTE — Progress Notes (Signed)
Date:  10/08/2019   Name:  Nancy Skinner   DOB:  Jul 16, 1966   MRN:  [id number]   Chief Complaint: Annual Exam (Breast Exam.) Nancy Skinner is a 53 y.o. female who presents today for her Complete Annual Exam. She feels fairly well. She reports exercising walking. She reports she is sleeping fairly well. She denies breast issues.  Mammogram 2019 Colonoscopy - none Pap - discontinued  Hyperlipidemia The problem is controlled. Pertinent negatives include no chest pain or shortness of breath. Current antihyperlipidemic treatment includes statins.  Depression        The problem occurs rarely.  The problem has been resolved since onset.  Associated symptoms include no fatigue and no headaches.  Past treatments include SSRIs - Selective serotonin reuptake inhibitors and other medications.  Compliance with treatment is good.  Previous treatment provided significant relief. Migraine  This is a recurrent problem. The problem has been gradually improving. Pertinent negatives include no abdominal pain, coughing, dizziness, fever, hearing loss, tinnitus or vomiting. She has tried triptans (and Emglality) for the symptoms.    Review of Systems  Constitutional: Negative for chills, fatigue and fever.  HENT: Negative for congestion, hearing loss, tinnitus, trouble swallowing and voice change.   Eyes: Negative for visual disturbance.  Respiratory: Negative for cough, chest tightness, shortness of breath and wheezing.   Cardiovascular: Negative for chest pain, palpitations and leg swelling.  Gastrointestinal: Negative for abdominal pain, constipation, diarrhea and vomiting.  Endocrine: Negative for polydipsia and polyuria.  Genitourinary: Negative for dysuria, frequency, genital sores, vaginal bleeding and vaginal discharge.  Musculoskeletal: Negative for arthralgias, gait problem and joint swelling.  Skin: Negative for color change and rash.  Neurological: Negative for dizziness, tremors,  light-headedness and headaches.  Hematological: Negative for adenopathy. Does not bruise/bleed easily.  Psychiatric/Behavioral: Positive for depression. Negative for dysphoric mood and sleep disturbance. The patient is not nervous/anxious.     Patient Active Problem List   Diagnosis Date Noted  . Coronary artery disease, non-occlusive 01/18/2019  . Depression with anxiety 01/18/2019  . Neutrophilic leukocytosis 08/09/2018  . Pulmonary nodule less than 6 cm determined by computed tomography of lung 07/30/2018  . Night sweats 07/16/2018  . Neck muscle spasm 07/15/2018  . Mixed hyperlipidemia 07/15/2018  . Tobacco use disorder 07/15/2018  . Migraine without aura and without status migrainosus, not intractable 06/24/2018  . Gastroesophageal reflux disease without esophagitis 06/24/2018  . Chronic midline low back pain without sciatica 06/24/2018    Allergies  Allergen Reactions  . Oxycodone Other (See Comments)    "Black outs" and confusion  . Dye Fdc Red [Red Dye] Other (See Comments)    Severe migraine    Past Surgical History:  Procedure Laterality Date  . ABDOMINAL HYSTERECTOMY  2012   partial for bleeding  . BREAST BIOPSY Right    neg  . BREAST BIOPSY Left 2015   4 bxs- neg  . BREAST SURGERY Left    multiple biopsies  . CATARACT EXTRACTION Right 12/19/2010  . CHOLECYSTECTOMY  1999  . INTRAVASCULAR PRESSURE WIRE/FFR STUDY N/A 07/22/2018   Procedure: INTRAVASCULAR PRESSURE WIRE/FFR STUDY;  Surgeon: Yvonne KendallEnd, Christopher, MD;  Location: [facility];  Service: Cardiovascular;  Laterality: N/A;  . LEFT HEART CATH AND CORONARY ANGIOGRAPHY N/A 07/22/2018   Procedure: LEFT HEART CATH AND CORONARY ANGIOGRAPHY;  Surgeon: Yvonne KendallEnd, Christopher, MD;  Location: [facility];  Service: Cardiovascular;  Laterality: N/A;  . POSTERIOR LUMBAR FUSION 4 LEVEL  2000  Social History   Tobacco Use  . Smoking status: Former Smoker    Packs/day: 0.25    Years: 40.00    Pack years:  10.00    Types: Cigarettes    Quit date: 09/18/2019    Years since quitting: 0.0  . Smokeless tobacco: Never Used  Substance Use Topics  . Alcohol use: Yes    Comment: less than once a week.  . Drug use: Never     Medication list has been reviewed and updated.  Current Meds  Medication Sig  . aspirin EC 81 MG tablet Take 81 mg by mouth daily.  . Aspirin-Acetaminophen (GOODYS BODY PAIN PO) Take 1 packet by mouth daily. Every day.  Marland Kitchen atorvastatin (LIPITOR) 40 MG tablet Take 1 tablet (40 mg total) by mouth daily.  Marland Kitchen buPROPion (WELLBUTRIN XL) 300 MG 24 hr tablet TAKE 1 TABLET(300 MG) BY MOUTH DAILY  . cyclobenzaprine (FLEXERIL) 10 MG tablet Take 1 tablet (10 mg total) by mouth 3 (three) times daily as needed for muscle spasms.  Marland Kitchen escitalopram (LEXAPRO) 10 MG tablet Take 1 tablet (10 mg total) by mouth daily.  . Esomeprazole Magnesium (NEXIUM PO) Take 20 mg by mouth daily.   . Galcanezumab-gnlm (EMGALITY) 120 MG/ML SOAJ Inject 120 mg into the skin every 30 (thirty) days.  . Lidocaine 4 % PTCH Apply 1 patch topically daily as needed (pain).   . metoprolol succinate (TOPROL-XL) 25 MG 24 hr tablet Take 0.5 tablets (12.5 mg total) by mouth daily. Take with or immediately following a meal.  . Multiple Vitamin (MULTIVITAMIN) capsule Take 1 capsule by mouth daily.  . Omega-3 Fatty Acids (FISH OIL) 1000 MG CAPS Take 2 capsules (2,000 mg total) by mouth daily.  . SUMAtriptan (IMITREX) 100 MG tablet Take 1 tablet (100 mg total) by mouth every 2 (two) hours as needed for migraine. May repeat in 2 hours if headache persists or recurs.  . triamcinolone (KENALOG) 0.025 % ointment Apply 1 application topically as needed. For rash    PHQ 2/9 Scores 10/08/2019 08/03/2019 06/16/2019 01/18/2019  PHQ - 2 Score 0 2 6 2   PHQ- 9 Score - 17 16 6     BP Readings from Last 3 Encounters:  10/08/19 128/78  09/02/19 90/60  08/03/19 128/78    Physical Exam Vitals signs and nursing note reviewed.  Constitutional:       General: She is not in acute distress.    Appearance: She is well-developed.  HENT:     Head: Normocephalic and atraumatic.     Right Ear: Tympanic membrane and ear canal normal.     Left Ear: Tympanic membrane and ear canal normal.     Nose:     Right Sinus: No maxillary sinus tenderness.     Left Sinus: No maxillary sinus tenderness.  Eyes:     General: No scleral icterus.       Right eye: No discharge.        Left eye: No discharge.     Conjunctiva/sclera: Conjunctivae normal.  Neck:     Musculoskeletal: Normal range of motion. No erythema.     Thyroid: No thyromegaly.     Vascular: No carotid bruit.  Cardiovascular:     Rate and Rhythm: Normal rate and regular rhythm.     Pulses: Normal pulses.     Heart sounds: Normal heart sounds.  Pulmonary:     Effort: Pulmonary effort is normal. No respiratory distress.     Breath sounds: No wheezing.  Chest:     Breasts:        Right: No mass, nipple discharge, skin change or tenderness.        Left: No mass, nipple discharge, skin change or tenderness.  Abdominal:     General: Bowel sounds are normal.     Palpations: Abdomen is soft.     Tenderness: There is no abdominal tenderness.  Musculoskeletal: Normal range of motion.  Lymphadenopathy:     Cervical: No cervical adenopathy.  Skin:    General: Skin is warm and dry.     Findings: No rash.  Neurological:     Mental Status: She is alert and oriented to person, place, and time.     Cranial Nerves: No cranial nerve deficit.     Sensory: No sensory deficit.     Deep Tendon Reflexes: Reflexes are normal and symmetric.  Psychiatric:        Speech: Speech normal.        Behavior: Behavior normal.        Thought Content: Thought content normal.     Wt Readings from Last 3 Encounters:  10/08/19 190 lb (86.2 kg)  09/02/19 194 lb 8 oz (88.2 kg)  08/03/19 197 lb (89.4 kg)    BP 128/78   Pulse 75   Ht 5\' 3"  (1.6 m)   Wt 190 lb (86.2 kg)   SpO2 96%   BMI 33.66 kg/m    Assessment and Plan: 1. Annual physical exam Normal exam Continue healthy diet, regular exercise Pt congratulated on quitting smoking  2. Encounter for screening mammogram for breast cancer To be scheduled after the first of the year - MM 3D SCREEN BREAST BILATERAL; Future  3. Colon cancer screening Will discuss at next visit since pt does not have insurance at this time  4. Coronary artery disease, non-occlusive Stable without sx Continue aspirin, statin and beta blockers  5. Migraine without aura and without status migrainosus, not intractable Doing very well on Emgality monthly and triptans PRN  6. Mixed hyperlipidemia Tolerating statin medication without side effects at this time LDL is at goal of < 70 on current dose Continue same therapy without change at this time. Check labs next visit  7. Depression with anxiety Clinically stable with good control of sx on SSRI No SI or HI.  No medication side effects   Partially dictated using . Any errors are unintentional.  Animal nutritionist, MD [facility]  10/08/2019

## 2020-01-05 ENCOUNTER — Other Ambulatory Visit: Payer: Self-pay | Admitting: Internal Medicine

## 2020-01-05 DIAGNOSIS — F17200 Nicotine dependence, unspecified, uncomplicated: Secondary | ICD-10-CM

## 2020-02-06 ENCOUNTER — Other Ambulatory Visit: Payer: Self-pay | Admitting: Internal Medicine

## 2020-02-06 DIAGNOSIS — F321 Major depressive disorder, single episode, moderate: Secondary | ICD-10-CM

## 2020-03-01 ENCOUNTER — Other Ambulatory Visit: Payer: Self-pay

## 2020-03-01 ENCOUNTER — Ambulatory Visit (INDEPENDENT_AMBULATORY_CARE_PROVIDER_SITE_OTHER): Payer: 59 | Admitting: Internal Medicine

## 2020-03-01 ENCOUNTER — Encounter: Payer: Self-pay | Admitting: Internal Medicine

## 2020-03-01 VITALS — BP 132/76 | HR 72 | Temp 97.1°F | Ht 63.0 in | Wt 191.0 lb

## 2020-03-01 DIAGNOSIS — G43009 Migraine without aura, not intractable, without status migrainosus: Secondary | ICD-10-CM

## 2020-03-01 DIAGNOSIS — R911 Solitary pulmonary nodule: Secondary | ICD-10-CM

## 2020-03-01 DIAGNOSIS — F321 Major depressive disorder, single episode, moderate: Secondary | ICD-10-CM | POA: Diagnosis not present

## 2020-03-01 DIAGNOSIS — Z1211 Encounter for screening for malignant neoplasm of colon: Secondary | ICD-10-CM | POA: Diagnosis not present

## 2020-03-01 DIAGNOSIS — IMO0001 Reserved for inherently not codable concepts without codable children: Secondary | ICD-10-CM

## 2020-03-01 MED ORDER — ESCITALOPRAM OXALATE 20 MG PO TABS
20.0000 mg | ORAL_TABLET | Freq: Every day | ORAL | 5 refills | Status: DC
Start: 2020-03-01 — End: 2020-06-25

## 2020-03-01 NOTE — Progress Notes (Signed)
Date:  03/01/2020   Name:  Nancy Skinner   DOB:  Aug 22, 1966   MRN:  [id number]   Chief Complaint: Depression (PHQ9- 8. GAD7- 18. )  Depression        This is a chronic problem.  The problem has been gradually worsening since onset.  Associated symptoms include fatigue, hopelessness, decreased interest and headaches.  Associated symptoms include no appetite change, not sad and no suicidal ideas.     The symptoms are aggravated by family issues, social issues and work stress.  Past treatments include SSRIs - Selective serotonin reuptake inhibitors and other medications.  Compliance with treatment is good.  Previous treatment provided moderate relief. Migraine  This is a recurrent problem. The problem occurs intermittently (becoming more frequent). The pain is located in the right unilateral region. The pain does not radiate. The pain quality is similar to prior headaches. Pertinent negatives include no abdominal pain, coughing, dizziness or weakness. She has tried triptans (and Emgality) for the symptoms. The treatment provided significant relief.    Lab Results  Component Value Date   CREATININE 0.77 09/02/2019   BUN 10 09/02/2019   NA 142 09/02/2019   K 4.9 09/02/2019   CL 103 09/02/2019   CO2 22 09/02/2019   Lab Results  Component Value Date   CHOL 130 09/02/2019   HDL 33 (L) 09/02/2019   LDLCALC 50 09/02/2019   LDLDIRECT 48 09/02/2019   TRIG 307 (H) 09/02/2019   CHOLHDL 3.9 09/02/2019   No results found for: TSH Lab Results  Component Value Date   HGBA1C 5.9 06/26/2018   Lab Results  Component Value Date   WBC 13.7 (H) 08/07/2018   HGB 14.1 08/07/2018   HCT 43.4 08/07/2018   MCV 86 08/07/2018   PLT 301 08/07/2018   Lab Results  Component Value Date   ALT 27 09/02/2019   AST 18 09/02/2019   ALKPHOS 94 09/02/2019   BILITOT <0.2 09/02/2019     Review of Systems  Constitutional: Positive for fatigue. Negative for appetite change, chills and unexpected weight  change.  Respiratory: Negative for cough, chest tightness, shortness of breath and wheezing.   Cardiovascular: Negative for chest pain, palpitations and leg swelling.  Gastrointestinal: Negative for abdominal pain, constipation and diarrhea.  Neurological: Positive for headaches. Negative for dizziness, tremors and weakness.  Psychiatric/Behavioral: Positive for depression, dysphoric mood and sleep disturbance. Negative for suicidal ideas. The patient is nervous/anxious.     Patient Active Problem List   Diagnosis Date Noted  . Coronary artery disease, non-occlusive 01/18/2019  . Depression with anxiety 01/18/2019  . Neutrophilic leukocytosis 66/05/3015  . Pulmonary nodule less than 6 cm determined by computed tomography of lung 07/30/2018  . Night sweats 07/16/2018  . Neck muscle spasm 07/15/2018  . Mixed hyperlipidemia 07/15/2018  . Tobacco use disorder, mild, in early remission 07/15/2018  . Migraine without aura and without status migrainosus, not intractable 06/24/2018  . Gastroesophageal reflux disease without esophagitis 06/24/2018  . Chronic midline low back pain without sciatica 06/24/2018    Allergies  Allergen Reactions  . Oxycodone Other (See Comments)    "Black outs" and confusion  . Dye Fdc Red [Red Dye] Other (See Comments)    Severe migraine    Past Surgical History:  Procedure Laterality Date  . ABDOMINAL HYSTERECTOMY  2012   partial for bleeding  . BREAST BIOPSY Right    neg  . BREAST BIOPSY Left 2015   4 bxs- neg  .  BREAST SURGERY Left    multiple biopsies  . CATARACT EXTRACTION Right 12/19/2010  . CHOLECYSTECTOMY  1999  . INTRAVASCULAR PRESSURE WIRE/FFR STUDY N/A 07/22/2018   Procedure: INTRAVASCULAR PRESSURE WIRE/FFR STUDY;  Surgeon: Yvonne Kendall, MD;  Location: [facility] INVASIVE CV LAB;  Service: Cardiovascular;  Laterality: N/A;  . LEFT HEART CATH AND CORONARY ANGIOGRAPHY N/A 07/22/2018   Procedure: LEFT HEART CATH AND CORONARY ANGIOGRAPHY;  Surgeon:  Yvonne Kendall, MD;  Location: [facility] INVASIVE CV LAB;  Service: Cardiovascular;  Laterality: N/A;  . POSTERIOR LUMBAR FUSION 4 LEVEL  2000    Social History   Tobacco Use  . Smoking status: Former Smoker    Packs/day: 0.25    Years: 40.00    Pack years: 10.00    Types: Cigarettes    Quit date: 09/18/2019    Years since quitting: 0.4  . Smokeless tobacco: Never Used  Substance Use Topics  . Alcohol use: Yes    Comment: less than once a week.  . Drug use: Never     Medication list has been reviewed and updated.  Current Meds  Medication Sig  . aspirin EC 81 MG tablet Take 81 mg by mouth daily.  . Aspirin-Acetaminophen (GOODYS BODY PAIN PO) Take 1 packet by mouth daily. Every day.  Marland Kitchen atorvastatin (LIPITOR) 40 MG tablet Take 1 tablet (40 mg total) by mouth daily.  Marland Kitchen buPROPion (WELLBUTRIN XL) 300 MG 24 hr tablet TAKE 1 TABLET(300 MG) BY MOUTH DAILY  . cyclobenzaprine (FLEXERIL) 10 MG tablet Take 1 tablet (10 mg total) by mouth 3 (three) times daily as needed for muscle spasms.  Marland Kitchen escitalopram (LEXAPRO) 10 MG tablet TAKE 1 TABLET BY MOUTH DAILY  . Esomeprazole Magnesium (NEXIUM PO) Take 20 mg by mouth daily.   . Galcanezumab-gnlm (EMGALITY) 120 MG/ML SOAJ Inject 120 mg into the skin every 30 (thirty) days.  . Lidocaine 4 % PTCH Apply 1 patch topically daily as needed (pain).   . Multiple Vitamin (MULTIVITAMIN) capsule Take 1 capsule by mouth daily.  . Omega-3 Fatty Acids (FISH OIL) 1000 MG CAPS Take 2 capsules (2,000 mg total) by mouth daily.  . SUMAtriptan (IMITREX) 100 MG tablet Take 1 tablet (100 mg total) by mouth every 2 (two) hours as needed for migraine. May repeat in 2 hours if headache persists or recurs.  . triamcinolone (KENALOG) 0.025 % ointment Apply 1 application topically as needed. For rash    PHQ 2/9 Scores 03/01/2020 10/08/2019 08/03/2019 06/16/2019  PHQ - 2 Score 6 0 2 6  PHQ- 9 Score 15 - 17 16   GAD 7 : Generalized Anxiety Score 03/01/2020 01/18/2019  Nervous,  Anxious, on Edge 3 3  Control/stop worrying 3 3  Worry too much - different things 3 3  Trouble relaxing 3 3  Restless 0 3  Easily annoyed or irritable 3 3  Afraid - awful might happen 3 3  Total GAD 7 Score 18 21  Anxiety Difficulty Very difficult Extremely difficult    BP Readings from Last 3 Encounters:  03/01/20 132/76  10/08/19 128/78  09/02/19 90/60    Physical Exam Vitals and nursing note reviewed.  Constitutional:      General: She is not in acute distress.    Appearance: Normal appearance. She is well-developed.  HENT:     Head: Normocephalic and atraumatic.  Cardiovascular:     Rate and Rhythm: Normal rate and regular rhythm.  Pulmonary:     Effort: Pulmonary effort is normal. No respiratory distress.  Breath sounds: No wheezing or rhonchi.  Musculoskeletal:     Cervical back: Normal range of motion.     Right lower leg: No edema.     Left lower leg: No edema.  Skin:    General: Skin is warm and dry.     Findings: No rash.  Neurological:     Mental Status: She is alert and oriented to person, place, and time.  Psychiatric:        Attention and Perception: Attention normal.        Mood and Affect: Affect is tearful.        Speech: Speech normal.        Behavior: Behavior normal.        Thought Content: Thought content normal.     Wt Readings from Last 3 Encounters:  03/01/20 191 lb (86.6 kg)  10/08/19 190 lb (86.2 kg)  09/02/19 194 lb 8 oz (88.2 kg)    BP 132/76   Pulse 72   Temp (!) 97.1 F (36.2 C) (Temporal)   Ht 5\' 3"  (1.6 m)   Wt 191 lb (86.6 kg)   SpO2 98%   BMI 33.83 kg/m   Assessment and Plan: 1. Current moderate episode of major depressive disorder without prior episode (HCC) Symptoms worsening due to current situation with her grandson Will continue bupropion 300 mg and increase Lexapro to 20 mg - escitalopram (LEXAPRO) 20 MG tablet; Take 1 tablet (20 mg total) by mouth daily.  Dispense: 30 tablet; Refill: 5  2. Colon cancer  screening - Ambulatory referral to Gastroenterology  3. Migraine without aura and without status migrainosus, not intractable Continue Emgality Use Imitrex liberally for abortive therapy sample of Ubrelvy given to try in place of Imitrex  4. Pulmonary nodule less than 6 cm determined by computed tomography of lung Will discuss follow up CT at next visit   Partially dictated using Dragon software. Any errors are unintentional.  , MD [facility]  03/01/2020

## 2020-03-06 ENCOUNTER — Ambulatory Visit (INDEPENDENT_AMBULATORY_CARE_PROVIDER_SITE_OTHER): Payer: Self-pay | Admitting: Gastroenterology

## 2020-03-06 VITALS — Ht 63.0 in

## 2020-03-06 DIAGNOSIS — Z1211 Encounter for screening for malignant neoplasm of colon: Secondary | ICD-10-CM

## 2020-03-06 NOTE — Progress Notes (Signed)
Gavilyte bowel prep has been called to [facility].  Pt has been asked to begin the prep on 03/30/20 at 5pm following the mixing instructions for preparation.  Drink 8 oz every 30 mins until she completes the entire contents.  Do not eat or drink anything 4 hours prior to her procedure.  This is not patients normal pharmacy.  She has been provided with the phone number for [facility]-she said she had already looked it up as well.  Thanks,  [place], [place]

## 2020-03-06 NOTE — Patient Instructions (Signed)
Gastroenterology Pre-Procedure Review  Request Date: 03/31/20 Requesting Physician: Dr. Allegra Lai  PATIENT REVIEW QUESTIONS: The patient responded to the following health history questions as indicated:    1. Are you having any GI issues? no 2. Do you have a personal history of Polyps? no 3. Do you have a family history of Colon Cancer or Polyps? no 4. Diabetes Mellitus? no 5. Joint replacements in the past 12 months?no 6. Major health problems in the past 3 months?no 7. Any artificial heart valves, MVP, or defibrillator?no    MEDICATIONS & ALLERGIES:    Patient reports the following regarding taking any anticoagulation/antiplatelet therapy:   Plavix, Coumadin, Eliquis, Xarelto, Lovenox, Pradaxa, Brilinta, or Effient? no Aspirin? no  Patient confirms/reports the following medications:  Current Outpatient Medications  Medication Sig Dispense Refill  . aspirin EC 81 MG tablet Take 81 mg by mouth daily.    . Aspirin-Acetaminophen (GOODYS BODY PAIN PO) Take 1 packet by mouth daily. Every day.    Marland Kitchen atorvastatin (LIPITOR) 40 MG tablet Take 1 tablet (40 mg total) by mouth daily. 30 tablet 11  . buPROPion (WELLBUTRIN XL) 300 MG 24 hr tablet TAKE 1 TABLET(300 MG) BY MOUTH DAILY 30 tablet 5  . cyclobenzaprine (FLEXERIL) 10 MG tablet Take 1 tablet (10 mg total) by mouth 3 (three) times daily as needed for muscle spasms. 90 tablet 1  . escitalopram (LEXAPRO) 20 MG tablet Take 1 tablet (20 mg total) by mouth daily. 30 tablet 5  . Esomeprazole Magnesium (NEXIUM PO) Take 20 mg by mouth daily.     . Galcanezumab-gnlm (EMGALITY) 120 MG/ML SOAJ Inject 120 mg into the skin every 30 (thirty) days. 3 pen 1  . Lidocaine 4 % PTCH Apply 1 patch topically daily as needed (pain).     . SUMAtriptan (IMITREX) 100 MG tablet Take 1 tablet (100 mg total) by mouth every 2 (two) hours as needed for migraine. May repeat in 2 hours if headache persists or recurs. 10 tablet 5  . triamcinolone (KENALOG) 0.025 % ointment  Apply 1 application topically as needed. For rash 30 g 3  . metoprolol succinate (TOPROL-XL) 25 MG 24 hr tablet Take 0.5 tablets (12.5 mg total) by mouth daily. Take with or immediately following a meal. 45 tablet 3  . Multiple Vitamin (MULTIVITAMIN) capsule Take 1 capsule by mouth daily.    . nitroGLYCERIN (NITROSTAT) 0.4 MG SL tablet Place 1 tablet (0.4 mg total) under the tongue every 5 (five) minutes as needed for chest pain. 25 tablet 4  . Omega-3 Fatty Acids (FISH OIL) 1000 MG CAPS Take 2 capsules (2,000 mg total) by mouth daily. (Patient not taking: Reported on 03/06/2020) 180 capsule 2   No current facility-administered medications for this visit.    Patient confirms/reports the following allergies:  Allergies  Allergen Reactions  . Oxycodone Other (See Comments)    "Black outs" and confusion  . Dye Fdc Red [Red Dye] Other (See Comments)    Severe migraine    Orders Placed This Encounter  Procedures  . Procedural/ Surgical Case Request: COLONOSCOPY WITH PROPOFOL    Standing Status:   Standing    Number of Occurrences:   1    Order Specific Question:   Pre-op diagnosis    Answer:   screening colonoscopy    Order Specific Question:   CPT Code    Answer:   16109    AUTHORIZATION INFORMATION Primary Insurance: 1D#: Group #:  Secondary Insurance: 1D#: Group #:  SCHEDULE INFORMATION: Date:  03/31/20 Time: Location:[facility]

## 2020-03-21 ENCOUNTER — Other Ambulatory Visit: Payer: Self-pay | Admitting: Internal Medicine

## 2020-03-21 DIAGNOSIS — G8929 Other chronic pain: Secondary | ICD-10-CM

## 2020-03-29 ENCOUNTER — Telehealth: Payer: Self-pay

## 2020-03-29 ENCOUNTER — Other Ambulatory Visit: Payer: Self-pay

## 2020-03-29 ENCOUNTER — Other Ambulatory Visit
Admission: RE | Admit: 2020-03-29 | Discharge: 2020-03-29 | Disposition: A | Discharge status: Home / Self Care | Payer: 59 | Source: Ambulatory Visit | Attending: Gastroenterology | Admitting: Gastroenterology

## 2020-03-29 DIAGNOSIS — Z20822 Contact with and (suspected) exposure to covid-19: Secondary | ICD-10-CM | POA: Insufficient documentation

## 2020-03-29 DIAGNOSIS — Z01812 Encounter for preprocedural laboratory examination: Secondary | ICD-10-CM | POA: Insufficient documentation

## 2020-03-29 DIAGNOSIS — Z1211 Encounter for screening for malignant neoplasm of colon: Secondary | ICD-10-CM

## 2020-03-29 LAB — SARS CORONAVIRUS 2 (TAT 6-24 HRS): SARS Coronavirus 2: NEGATIVE

## 2020-03-29 NOTE — Telephone Encounter (Signed)
Trish Is calling patient procedure was canceled for 03/31/2020 with Dr. Allegra Lai. I do not see why it was canceled. Patient had COVID test today. Please advised

## 2020-03-29 NOTE — Telephone Encounter (Signed)
Put patient with Dr. Servando Snare on 03/31/2020 since procedure is a screening

## 2020-03-31 ENCOUNTER — Encounter
Admission: RE | Disposition: A | Discharge status: Home / Self Care | Payer: Self-pay | Source: Home / Self Care | Attending: Gastroenterology

## 2020-03-31 ENCOUNTER — Ambulatory Visit: Payer: 59 | Admitting: Anesthesiology

## 2020-03-31 ENCOUNTER — Other Ambulatory Visit: Payer: Self-pay

## 2020-03-31 ENCOUNTER — Ambulatory Visit
Admission: RE | Admit: 2020-03-31 | Discharge: 2020-03-31 | Disposition: A | Discharge status: Home / Self Care | Payer: 59 | Attending: Gastroenterology | Admitting: Gastroenterology

## 2020-03-31 ENCOUNTER — Ambulatory Visit: Admit: 2020-03-31 | Payer: 59 | Admitting: Gastroenterology

## 2020-03-31 ENCOUNTER — Encounter: Payer: Self-pay | Admitting: Gastroenterology

## 2020-03-31 DIAGNOSIS — I2510 Atherosclerotic heart disease of native coronary artery without angina pectoris: Secondary | ICD-10-CM | POA: Diagnosis not present

## 2020-03-31 DIAGNOSIS — Z9841 Cataract extraction status, right eye: Secondary | ICD-10-CM | POA: Insufficient documentation

## 2020-03-31 DIAGNOSIS — Z885 Allergy status to narcotic agent status: Secondary | ICD-10-CM | POA: Insufficient documentation

## 2020-03-31 DIAGNOSIS — E782 Mixed hyperlipidemia: Secondary | ICD-10-CM | POA: Diagnosis not present

## 2020-03-31 DIAGNOSIS — Z7982 Long term (current) use of aspirin: Secondary | ICD-10-CM | POA: Diagnosis not present

## 2020-03-31 DIAGNOSIS — Z811 Family history of alcohol abuse and dependence: Secondary | ICD-10-CM | POA: Diagnosis not present

## 2020-03-31 DIAGNOSIS — G43909 Migraine, unspecified, not intractable, without status migrainosus: Secondary | ICD-10-CM | POA: Diagnosis not present

## 2020-03-31 DIAGNOSIS — F419 Anxiety disorder, unspecified: Secondary | ICD-10-CM | POA: Diagnosis not present

## 2020-03-31 DIAGNOSIS — Z9102 Food additives allergy status: Secondary | ICD-10-CM | POA: Insufficient documentation

## 2020-03-31 DIAGNOSIS — Z87891 Personal history of nicotine dependence: Secondary | ICD-10-CM | POA: Diagnosis not present

## 2020-03-31 DIAGNOSIS — Z1211 Encounter for screening for malignant neoplasm of colon: Secondary | ICD-10-CM | POA: Diagnosis not present

## 2020-03-31 DIAGNOSIS — Z803 Family history of malignant neoplasm of breast: Secondary | ICD-10-CM | POA: Insufficient documentation

## 2020-03-31 DIAGNOSIS — K641 Second degree hemorrhoids: Secondary | ICD-10-CM | POA: Diagnosis not present

## 2020-03-31 DIAGNOSIS — Z90710 Acquired absence of both cervix and uterus: Secondary | ICD-10-CM | POA: Insufficient documentation

## 2020-03-31 DIAGNOSIS — Z79899 Other long term (current) drug therapy: Secondary | ICD-10-CM | POA: Insufficient documentation

## 2020-03-31 DIAGNOSIS — Z8249 Family history of ischemic heart disease and other diseases of the circulatory system: Secondary | ICD-10-CM | POA: Insufficient documentation

## 2020-03-31 DIAGNOSIS — Z981 Arthrodesis status: Secondary | ICD-10-CM | POA: Diagnosis not present

## 2020-03-31 DIAGNOSIS — K219 Gastro-esophageal reflux disease without esophagitis: Secondary | ICD-10-CM | POA: Diagnosis not present

## 2020-03-31 DIAGNOSIS — Z9049 Acquired absence of other specified parts of digestive tract: Secondary | ICD-10-CM | POA: Insufficient documentation

## 2020-03-31 DIAGNOSIS — F329 Major depressive disorder, single episode, unspecified: Secondary | ICD-10-CM | POA: Diagnosis not present

## 2020-03-31 HISTORY — PX: COLONOSCOPY WITH PROPOFOL: SHX5780

## 2020-03-31 SURGERY — COLONOSCOPY WITH PROPOFOL
Anesthesia: General

## 2020-03-31 MED ORDER — LIDOCAINE HCL (CARDIAC) PF 100 MG/5ML IV SOSY
PREFILLED_SYRINGE | INTRAVENOUS | Status: DC | PRN
  Administered 2020-03-31: 50 mg via INTRAVENOUS

## 2020-03-31 MED ORDER — PROPOFOL 500 MG/50ML IV EMUL
INTRAVENOUS | Status: AC
  Filled 2020-03-31: qty 50

## 2020-03-31 MED ORDER — PROPOFOL 10 MG/ML IV BOLUS
INTRAVENOUS | Status: DC | PRN
  Administered 2020-03-31: 60 mg via INTRAVENOUS

## 2020-03-31 MED ORDER — PROPOFOL 500 MG/50ML IV EMUL
INTRAVENOUS | Status: DC | PRN
  Administered 2020-03-31: 150 ug/kg/min via INTRAVENOUS

## 2020-03-31 MED ORDER — SODIUM CHLORIDE 0.9 % IV SOLN
INTRAVENOUS | Status: DC
  Administered 2020-03-31: 1000 mL via INTRAVENOUS

## 2020-03-31 NOTE — Anesthesia Postprocedure Evaluation (Signed)
Anesthesia Post Note  Patient: Emergency planning/management officer  Procedure(s) Performed: COLONOSCOPY WITH PROPOFOL (N/A )  Patient location during evaluation: Endoscopy Anesthesia Type: General Level of consciousness: awake and alert Pain management: pain level controlled Vital Signs Assessment: post-procedure vital signs reviewed and stable Respiratory status: spontaneous breathing and respiratory function stable Cardiovascular status: stable Anesthetic complications: no     Last Vitals:  Vitals:   03/31/20 0908 03/31/20 0910  BP: 114/73 120/75  Pulse: 68   Resp: 19   Temp:    SpO2: 100%     Last Pain:  Vitals:   03/31/20 0910  TempSrc:   PainSc: 0-No pain                 Ryver Poblete K

## 2020-03-31 NOTE — Transfer of Care (Signed)
Immediate Anesthesia Transfer of Care Note  Patient: Nancy Skinner  Procedure(s) Performed: COLONOSCOPY WITH PROPOFOL (N/A )  Patient Location: PACU  Anesthesia Type:General  Level of Consciousness: sedated  Airway & Oxygen Therapy: Patient Spontanous Breathing and Patient connected to nasal cannula oxygen  Post-op Assessment: Report given to RN and Post -op Vital signs reviewed and stable  Post vital signs: Reviewed and stable  Last Vitals:  Vitals Value Taken Time  BP 109/76 03/31/20 0851  Temp 35.8 C 03/31/20 0849  Pulse 72 03/31/20 0851  Resp 14 03/31/20 0851  SpO2 94 % 03/31/20 0851    Last Pain:  Vitals:   03/31/20 0849  TempSrc: Temporal  PainSc: Asleep         Complications: No apparent anesthesia complications

## 2020-03-31 NOTE — Anesthesia Preprocedure Evaluation (Signed)
Anesthesia Evaluation  Patient identified by MRN, date of birth, ID band Patient awake    Reviewed: Allergy & Precautions, NPO status , Patient's Chart, lab work & pertinent test results  History of Anesthesia Complications Negative for: history of anesthetic complications  Airway Mallampati: III       Dental   Pulmonary neg sleep apnea, neg COPD, Not current smoker, former smoker,           Cardiovascular (-) hypertension+ CAD  (-) CHF (-) dysrhythmias (-) Valvular Problems/Murmurs     Neuro/Psych neg Seizures Anxiety Depression    GI/Hepatic Neg liver ROS, GERD  Medicated and Controlled,  Endo/Other  neg diabetes  Renal/GU negative Renal ROS     Musculoskeletal   Abdominal   Peds  Hematology   Anesthesia Other Findings   Reproductive/Obstetrics                             Anesthesia Physical Anesthesia Plan  ASA: III  Anesthesia Plan: General   Post-op Pain Management:    Induction: Intravenous  PONV Risk Score and Plan: 3 and Propofol infusion, TIVA and Treatment may vary due to age or medical condition  Airway Management Planned: Nasal Cannula  Additional Equipment:   Intra-op Plan:   Post-operative Plan:   Informed Consent: I have reviewed the patients History and Physical, chart, labs and discussed the procedure including the risks, benefits and alternatives for the proposed anesthesia with the patient or authorized representative who has indicated his/her understanding and acceptance.       Plan Discussed with:   Anesthesia Plan Comments:         Anesthesia Quick Evaluation

## 2020-03-31 NOTE — Op Note (Signed)
[facility] Gastroenterology Patient Name: Nancy Skinner Procedure Date: 03/31/2020 8:25 AM MRN: [id number] Account #: [id number] Date of Birth: 1966/04/04 Admit Type: Outpatient Age: 54 Room: [facility] ENDO ROOM 4 Gender: Female Note Status: Finalized Procedure:             Colonoscopy Indications:           Screening for colorectal malignant neoplasm Providers:             Lucilla Lame MD, MD Referring MD:          Halina Maidens, MD (Referring MD) Medicines:             Propofol per Anesthesia Complications:         No immediate complications. Procedure:             Pre-Anesthesia Assessment:                        - Prior to the procedure, a History and Physical was                         performed, and patient medications and allergies were                         reviewed. The patient's tolerance of previous                         anesthesia was also reviewed. The risks and benefits                         of the procedure and the sedation options and risks                         were discussed with the patient. All questions were                         answered, and informed consent was obtained. Prior                         Anticoagulants: The patient has taken no previous                         anticoagulant or antiplatelet agents. ASA Grade                         Assessment: II - A patient with mild systemic disease.                         After reviewing the risks and benefits, the patient                         was deemed in satisfactory condition to undergo the                         procedure.                        After obtaining informed consent, the colonoscope was  passed under direct vision. Throughout the procedure,                         the patient's blood pressure, pulse, and oxygen                         saturations were monitored continuously. The                         Colonoscope was introduced through  the anus and                         advanced to the the cecum, identified by appendiceal                         orifice and ileocecal valve. The colonoscopy was                         performed without difficulty. The patient tolerated                         the procedure well. The quality of the bowel                         preparation was good. Findings:      The perianal and digital rectal examinations were normal.      Non-bleeding internal hemorrhoids were found during retroflexion. The       hemorrhoids were Grade II (internal hemorrhoids that prolapse but reduce       spontaneously). Impression:            - Non-bleeding internal hemorrhoids.                        - No specimens collected. Recommendation:        - Discharge patient to home.                        - Resume previous diet.                        - Continue present medications.                        - Repeat colonoscopy in 10 years for screening unless                         any change in family history or lower GI problems. Procedure Code(s):     --- Professional ---                        [phone], Colonoscopy, flexible; diagnostic, including                         collection of specimen(s) by brushing or washing, when                         performed (separate procedure) Diagnosis Code(s):     --- Professional ---  Z12.11, Encounter for screening for malignant neoplasm                         of colon CPT copyright 2019 [employer]. All rights reserved. The codes documented in this report are preliminary and upon coder review may  be revised to meet current compliance requirements. Midge Minium MD, MD 03/31/2020 8:45:43 AM This report has been signed electronically. Number of Addenda: 0 Note Initiated On: 03/31/2020 8:25 AM Scope Withdrawal Time: 0 hours 8 minutes 14 seconds  Total Procedure Duration: 0 hours 13 minutes 33 seconds  Estimated Blood Loss:  Estimated  blood loss: none. Estimated blood loss: none.      [facility]

## 2020-03-31 NOTE — Anesthesia Procedure Notes (Signed)
Date/Time: 03/31/2020 8:28 AM Performed by: Ginger Carne, CRNA Pre-anesthesia Checklist: Patient identified, Emergency Drugs available, Suction available, Patient being monitored and Timeout performed Patient Re-evaluated:Patient Re-evaluated prior to induction Oxygen Delivery Method: Nasal cannula Preoxygenation: Pre-oxygenation with 100% oxygen Induction Type: IV induction

## 2020-03-31 NOTE — H&P (Signed)
Midge Minium, MD [facility] [address] [place], [place] [address] Phone: [phone] Fax : [phone]  Primary Care Physician:  Reubin Milan, MD Primary Gastroenterologist:  Dr. Servando Snare  Pre-Procedure History & Physical: HPI:  Nancy Skinner is a 54 y.o. female is here for a screening colonoscopy.   Past Medical History:  Diagnosis Date  . Coronary artery disease, non-occlusive    a. LHC 07/22/18: LM nl, mLAD 60% w/ FFR 0.83, LCx nl, RCA nl, EF 55-65%, nl LVEDP, no AS.  . Depression   . Lumbar disc disease   . Migraines   . Mixed hyperlipidemia     Past Surgical History:  Procedure Laterality Date  . ABDOMINAL HYSTERECTOMY  2012   partial for bleeding  . BACK SURGERY    . BREAST BIOPSY Right    neg  . BREAST BIOPSY Left 2015   4 bxs- neg  . BREAST SURGERY Left    multiple biopsies  . CARDIAC CATHETERIZATION    . CATARACT EXTRACTION Right 12/19/2010  . CHOLECYSTECTOMY  1999  . EYE SURGERY    . INTRAVASCULAR PRESSURE WIRE/FFR STUDY N/A 07/22/2018   Procedure: INTRAVASCULAR PRESSURE WIRE/FFR STUDY;  Surgeon: Yvonne Kendall, MD;  Location: [facility] INVASIVE CV LAB;  Service: Cardiovascular;  Laterality: N/A;  . LEFT HEART CATH AND CORONARY ANGIOGRAPHY N/A 07/22/2018   Procedure: LEFT HEART CATH AND CORONARY ANGIOGRAPHY;  Surgeon: Yvonne Kendall, MD;  Location: [facility] INVASIVE CV LAB;  Service: Cardiovascular;  Laterality: N/A;  . POSTERIOR LUMBAR FUSION 4 LEVEL  2000    Prior to Admission medications   Medication Sig Start Date End Date Taking? Authorizing Provider  aspirin EC 81 MG tablet Take 81 mg by mouth daily.   Yes [provider]  Aspirin-Acetaminophen (GOODYS BODY PAIN PO) Take 1 packet by mouth daily. Every day.   Yes [provider]  atorvastatin (LIPITOR) 40 MG tablet Take 1 tablet (40 mg total) by mouth daily. 08/03/19 08/02/20 Yes Reubin Milan, MD  buPROPion (WELLBUTRIN XL) 300 MG 24 hr tablet TAKE 1 TABLET(300 MG) BY MOUTH DAILY  01/05/20  Yes Reubin Milan, MD  Esomeprazole Magnesium (NEXIUM PO) Take 20 mg by mouth daily.    Yes [provider]  Galcanezumab-gnlm (EMGALITY) 120 MG/ML SOAJ Inject 120 mg into the skin every 30 (thirty) days. 06/16/19  Yes Reubin Milan, MD  Multiple Vitamin (MULTIVITAMIN) capsule Take 1 capsule by mouth daily.   Yes [provider]  SUMAtriptan (IMITREX) 100 MG tablet Take 1 tablet (100 mg total) by mouth every 2 (two) hours as needed for migraine. May repeat in 2 hours if headache persists or recurs. 06/16/19  Yes Reubin Milan, MD  triamcinolone (KENALOG) 0.025 % ointment Apply 1 application topically as needed. For rash 01/26/19  Yes Dunn, Raymon Mutton, PA-C  cyclobenzaprine (FLEXERIL) 10 MG tablet TAKE 1 TABLET BY MOUTH THREE TIMES DAILY AS NEEDED FOR MUSCLE SPASMS 03/22/20   Reubin Milan, MD  escitalopram (LEXAPRO) 20 MG tablet Take 1 tablet (20 mg total) by mouth daily. 03/01/20   Reubin Milan, MD  Lidocaine 4 % PTCH Apply 1 patch topically daily as needed (pain).     [provider]  metoprolol succinate (TOPROL-XL) 25 MG 24 hr tablet Take 0.5 tablets (12.5 mg total) by mouth daily. Take with or immediately following a meal. 01/26/19 10/08/19  Dunn, Raymon Mutton, PA-C  nitroGLYCERIN (NITROSTAT) 0.4 MG SL tablet Place 1 tablet (0.4 mg total) under the  tongue every 5 (five) minutes as needed for chest pain. 07/16/18 09/02/19  End, Harrell Gave, MD  Omega-3 Fatty Acids (FISH OIL) 1000 MG CAPS Take 2 capsules (2,000 mg total) by mouth daily. Patient not taking: Reported on 03/06/2020 09/06/19   Nelva Bush, MD    Allergies as of 03/29/2020 - Review Complete 03/06/2020  Allergen Reaction Noted  . Oxycodone Other (See Comments) 07/16/2019  . Dye fdc red [red dye] Other (See Comments) 06/24/2018    Family History  Problem Relation Age of Onset  . Breast cancer Mother 67  . Heart attack Father 62  . Alcohol abuse Father   . Breast cancer Maternal Grandmother    . Breast cancer Paternal Grandmother     Social History   Socioeconomic History  . Marital status: Divorced    Spouse name: Not on file  . Number of children: Not on file  . Years of education: Not on file  . Highest education level: Not on file  Occupational History  . Occupation: [employer]  Tobacco Use  . Smoking status: Former Smoker    Packs/day: 0.25    Years: 40.00    Pack years: 10.00    Types: Cigarettes    Quit date: 09/18/2019    Years since quitting: 0.5  . Smokeless tobacco: Never Used  Substance and Sexual Activity  . Alcohol use: Yes    Comment: less than once a week.  . Drug use: Never  . Sexual activity: Yes    Birth control/protection: Surgical  Other Topics Concern  . Not on file  Social History Narrative  . Not on file   Social Determinants of Health   Financial Resource Strain:   . Difficulty of Paying Living Expenses:   Food Insecurity:   . Worried About Charity fundraiser in the Last Year:   . Arboriculturist in the Last Year:   Transportation Needs:   . Film/video editor (Medical):   Marland Kitchen Lack of Transportation (Non-Medical):   Physical Activity:   . Days of Exercise per Week:   . Minutes of Exercise per Session:   Stress:   . Feeling of Stress :   Social Connections:   . Frequency of Communication with Friends and Family:   . Frequency of Social Gatherings with Friends and Family:   . Attends Religious Services:   . Active Member of Clubs or Organizations:   . Attends Archivist Meetings:   Marland Kitchen Marital Status:   Intimate Partner Violence:   . Fear of Current or Ex-Partner:   . Emotionally Abused:   Marland Kitchen Physically Abused:   . Sexually Abused:     Review of Systems: See HPI, otherwise negative ROS  Physical Exam: BP 114/78   Pulse 66   Temp (!) 96.8 F (36 C) (Temporal)   Resp 18   Ht 5\' 3"  (1.6 m)   Wt 88.6 kg   SpO2 100%   BMI 34.61 kg/m  General:   Alert,  pleasant and cooperative in NAD Head:  Normocephalic  and atraumatic. Neck:  Supple; no masses or thyromegaly. Lungs:  Clear throughout to auscultation.    Heart:  Regular rate and rhythm. Abdomen:  Soft, nontender and nondistended. Normal bowel sounds, without guarding, and without rebound.   Neurologic:  Alert and  oriented x4;  grossly normal neurologically.  Impression/Plan: Nancy Skinner is now here to undergo a screening colonoscopy.  Risks, benefits, and alternatives regarding colonoscopy have been reviewed with the patient.  Questions have been answered.  All parties agreeable.

## 2020-04-03 ENCOUNTER — Encounter: Payer: Self-pay | Admitting: *Deleted

## 2020-05-11 ENCOUNTER — Ambulatory Visit (INDEPENDENT_AMBULATORY_CARE_PROVIDER_SITE_OTHER): Payer: 59 | Admitting: Internal Medicine

## 2020-05-11 ENCOUNTER — Encounter: Payer: Self-pay | Admitting: Internal Medicine

## 2020-05-11 ENCOUNTER — Other Ambulatory Visit: Payer: Self-pay

## 2020-05-11 VITALS — BP 132/70 | HR 77 | Ht 63.0 in | Wt 197.0 lb

## 2020-05-11 DIAGNOSIS — G43009 Migraine without aura, not intractable, without status migrainosus: Secondary | ICD-10-CM

## 2020-05-11 DIAGNOSIS — F418 Other specified anxiety disorders: Secondary | ICD-10-CM | POA: Diagnosis not present

## 2020-05-11 DIAGNOSIS — M778 Other enthesopathies, not elsewhere classified: Secondary | ICD-10-CM

## 2020-05-11 MED ORDER — UBRELVY 100 MG PO TABS: 100.0000 mg | ORAL_TABLET | Freq: Every day | ORAL | 5 refills | Status: DC | PRN

## 2020-05-11 MED ORDER — CLONAZEPAM 0.25 MG PO TBDP: 0.2500 mg | ORAL_TABLET | Freq: Two times a day (BID) | ORAL | 0 refills | Status: DC | PRN

## 2020-05-11 NOTE — Progress Notes (Signed)
Date:  05/11/2020   Name:  Nancy Skinner   DOB:  Sep 27, 1966   MRN:  [id number]   Chief Complaint: Migraine (Wants to know if we have anymore ubrevly samples. They are very helpful to her migraines. Needs 90 days if we do RX. ) and Anxiety  Migraine  This is a recurrent problem. The problem occurs intermittently. The pain is located in the temporal region. The pain does not radiate. The pain quality is similar to prior headaches. The quality of the pain is described as throbbing. The pain is moderate. Associated symptoms include nausea, photophobia and scalp tenderness. Pertinent negatives include no coughing, dizziness, fever or weakness. Nothing aggravates the symptoms. She has tried triptans (and Bernita Raisin) for the symptoms. The treatment provided significant relief.  Anxiety Presents for follow-up visit. Symptoms include decreased concentration, depressed mood, excessive worry, irritability, nausea, nervous/anxious behavior, palpitations and restlessness. Patient reports no confusion, dizziness, malaise, panic, shortness of breath or suicidal ideas. Symptoms occur most days. The quality of sleep is good.   Compliance with medications is 76-100% (Celexa and Bupropion).    Lab Results  Component Value Date   CREATININE 0.77 09/02/2019   BUN 10 09/02/2019   NA 142 09/02/2019   K 4.9 09/02/2019   CL 103 09/02/2019   CO2 22 09/02/2019   Lab Results  Component Value Date   CHOL 130 09/02/2019   HDL 33 (L) 09/02/2019   LDLCALC 50 09/02/2019   LDLDIRECT 48 09/02/2019   TRIG 307 (H) 09/02/2019   CHOLHDL 3.9 09/02/2019   No results found for: TSH Lab Results  Component Value Date   HGBA1C 5.9 06/26/2018   Lab Results  Component Value Date   WBC 13.7 (H) 08/07/2018   HGB 14.1 08/07/2018   HCT 43.4 08/07/2018   MCV 86 08/07/2018   PLT 301 08/07/2018   Lab Results  Component Value Date   ALT 27 09/02/2019   AST 18 09/02/2019   ALKPHOS 94 09/02/2019   BILITOT <0.2  09/02/2019     Review of Systems  Constitutional: Positive for irritability and unexpected weight change. Negative for chills, fatigue and fever.  Eyes: Positive for photophobia.  Respiratory: Negative for cough, chest tightness and shortness of breath.   Cardiovascular: Positive for palpitations.  Gastrointestinal: Positive for nausea.  Musculoskeletal: Positive for arthralgias (pain in the right forearm when using her hand).  Neurological: Positive for headaches. Negative for dizziness, weakness and light-headedness.  Psychiatric/Behavioral: Positive for decreased concentration and dysphoric mood. Negative for confusion, sleep disturbance and suicidal ideas. The patient is nervous/anxious.     Patient Active Problem List   Diagnosis Date Noted  . Screening for colon cancer   . Coronary artery disease, non-occlusive 01/18/2019  . Depression with anxiety 01/18/2019  . Neutrophilic leukocytosis 08/09/2018  . Pulmonary nodule less than 6 cm determined by computed tomography of lung 07/30/2018  . Night sweats 07/16/2018  . Neck muscle spasm 07/15/2018  . Mixed hyperlipidemia 07/15/2018  . Tobacco use disorder, mild, in early remission 07/15/2018  . Migraine without aura and without status migrainosus, not intractable 06/24/2018  . Gastroesophageal reflux disease without esophagitis 06/24/2018  . Chronic midline low back pain without sciatica 06/24/2018    Allergies  Allergen Reactions  . Oxycodone Other (See Comments)    "Black outs" and confusion  . Dye Fdc Red [Red Dye] Other (See Comments)    Severe migraine    Past Surgical History:  Procedure Laterality Date  . ABDOMINAL HYSTERECTOMY  2012   partial for bleeding  . BACK SURGERY    . BREAST BIOPSY Right    neg  . BREAST BIOPSY Left 2015   4 bxs- neg  . BREAST SURGERY Left    multiple biopsies  . CARDIAC CATHETERIZATION    . CATARACT EXTRACTION Right 12/19/2010  . CHOLECYSTECTOMY  1999  . COLONOSCOPY WITH PROPOFOL  N/A 03/31/2020   Procedure: COLONOSCOPY WITH PROPOFOL;  Surgeon: Lucilla Lame, MD;  Location: [facility] ENDOSCOPY;  Service: Endoscopy;  Laterality: N/A;  . EYE SURGERY    . INTRAVASCULAR PRESSURE WIRE/FFR STUDY N/A 07/22/2018   Procedure: INTRAVASCULAR PRESSURE WIRE/FFR STUDY;  Surgeon: Nelva Bush, MD;  Location: [place] CV LAB;  Service: Cardiovascular;  Laterality: N/A;  . LEFT HEART CATH AND CORONARY ANGIOGRAPHY N/A 07/22/2018   Procedure: LEFT HEART CATH AND CORONARY ANGIOGRAPHY;  Surgeon: Nelva Bush, MD;  Location: [place] CV LAB;  Service: Cardiovascular;  Laterality: N/A;  . POSTERIOR LUMBAR FUSION 4 LEVEL  2000    Social History   Tobacco Use  . Smoking status: Former Smoker    Packs/day: 0.25    Years: 40.00    Pack years: 10.00    Types: Cigarettes    Quit date: 09/18/2019    Years since quitting: 0.6  . Smokeless tobacco: Never Used  Substance Use Topics  . Alcohol use: Yes    Comment: less than once a week.  . Drug use: Never     Medication list has been reviewed and updated.  Current Meds  Medication Sig  . aspirin EC 81 MG tablet Take 81 mg by mouth daily.  . Aspirin-Acetaminophen (GOODYS BODY PAIN PO) Take 1 packet by mouth daily. Every day.  Marland Kitchen atorvastatin (LIPITOR) 40 MG tablet Take 1 tablet (40 mg total) by mouth daily.  Marland Kitchen buPROPion (WELLBUTRIN XL) 300 MG 24 hr tablet TAKE 1 TABLET(300 MG) BY MOUTH DAILY  . cyclobenzaprine (FLEXERIL) 10 MG tablet TAKE 1 TABLET BY MOUTH THREE TIMES DAILY AS NEEDED FOR MUSCLE SPASMS  . escitalopram (LEXAPRO) 20 MG tablet Take 1 tablet (20 mg total) by mouth daily.  . Galcanezumab-gnlm (EMGALITY) 120 MG/ML SOAJ Inject 120 mg into the skin every 30 (thirty) days.  . Lidocaine 4 % PTCH Apply 1 patch topically daily as needed (pain).   . Multiple Vitamin (MULTIVITAMIN) capsule Take 1 capsule by mouth daily.    PHQ 2/9 Scores 05/11/2020 03/01/2020 10/08/2019 08/03/2019  PHQ - 2 Score 6 6 0 2  PHQ- 9 Score 16 15 - 17    GAD 7 : Generalized Anxiety Score 05/11/2020 03/01/2020 01/18/2019  Nervous, Anxious, on Edge 2 3 3   Control/stop worrying 3 3 3   Worry too much - different things 3 3 3   Trouble relaxing 3 3 3   Restless 3 0 3  Easily annoyed or irritable 3 3 3   Afraid - awful might happen 3 3 3   Total GAD 7 Score 20 18 21   Anxiety Difficulty Somewhat difficult Very difficult Extremely difficult    BP Readings from Last 3 Encounters:  05/11/20 132/70  03/31/20 120/75  03/01/20 132/76    Physical Exam Constitutional:      Appearance: Normal appearance.  Cardiovascular:     Rate and Rhythm: Normal rate and regular rhythm.  Pulmonary:     Effort: Pulmonary effort is normal.     Breath sounds: Normal breath sounds.  Musculoskeletal:     Right elbow: Normal range of motion. No tenderness.     Left  elbow: Normal range of motion. No tenderness.     Right forearm: Swelling and tenderness present.     Left forearm: Normal.     Cervical back: Normal range of motion.     Right lower leg: No edema.     Left lower leg: No edema.  Lymphadenopathy:     Cervical: No cervical adenopathy.  Neurological:     General: No focal deficit present.     Mental Status: She is alert and oriented to person, place, and time.  Psychiatric:        Attention and Perception: Attention normal.        Mood and Affect: Mood is anxious.        Speech: Speech normal.        Thought Content: Thought content normal. Thought content does not include suicidal plan.        Cognition and Memory: Cognition normal.     Wt Readings from Last 3 Encounters:  05/11/20 197 lb (89.4 kg)  03/31/20 195 lb 6.3 oz (88.6 kg)  03/01/20 191 lb (86.6 kg)    BP 132/70   Pulse 77   Ht 5\' 3"  (1.6 m)   Wt 197 lb (89.4 kg)   SpO2 98%   BMI 34.90 kg/m   Assessment and Plan: 1. Migraine without aura and without status migrainosus, not intractable Continue emgality for prevention Continue Imitrex PRN for abortive therapy Will try  Ubrelvy for abortive therapy as well - tried samples and would like an Rx - Ubrogepant (UBRELVY) 100 MG TABS; Take 100 mg by mouth daily as needed.  Dispense: 30 tablet; Refill: 5  2. Depression with anxiety Continue current dose of SSRI and bupropion Will add low dose clonazepam bid PRN until the current stressors have improved (custody court case in the next few weeks) Follow up response at next visit - clonazePAM (KLONOPIN) 0.25 MG disintegrating tablet; Take 1 tablet (0.25 mg total) by mouth 2 (two) times daily as needed for seizure.  Dispense: 60 tablet; Refill: 0  3. Right elbow tendonitis Due to overuse Recommend topical analgesic such as as needed   Partially dictated [employer]. Any errors are unintentional.  Art therapist, MD [facility]  05/11/2020

## 2020-05-12 ENCOUNTER — Telehealth: Payer: Self-pay

## 2020-05-12 NOTE — Telephone Encounter (Signed)
Completed PA on [email or web address].  (KeyDerek Mound)  Awaiting outcome for [facility] [employer].   CM

## 2020-05-16 NOTE — Telephone Encounter (Signed)
PA was denied. Completed Appeal.   CM

## 2020-05-19 ENCOUNTER — Other Ambulatory Visit: Payer: Self-pay

## 2020-05-19 DIAGNOSIS — F17200 Nicotine dependence, unspecified, uncomplicated: Secondary | ICD-10-CM

## 2020-05-19 MED ORDER — BUPROPION HCL ER (XL) 300 MG PO TB24: ORAL_TABLET | ORAL | 5 refills | Status: DC

## 2020-05-19 NOTE — Telephone Encounter (Signed)
Nancy Skinner, with [facility], called in stating he would like to discuss the appeal with a nursing assistant or PCP. Please advise and call back.

## 2020-05-22 NOTE — Telephone Encounter (Signed)
Called Derek from [facility] back and discussed the appeal pt is approved. Insurance will only allow 16 tablets per month. I stated that pt will not be taking 2 drugs in the same class when asked.   KP

## 2020-05-23 ENCOUNTER — Telehealth: Payer: Self-pay

## 2020-05-23 NOTE — Telephone Encounter (Signed)
Received FAX for approval for Ubrelvy 100mg  tabs.   Approved from 05/22/2020-05/22/2021.  CM

## 2020-05-29 ENCOUNTER — Ambulatory Visit: Payer: 59 | Admitting: Internal Medicine

## 2020-06-05 ENCOUNTER — Ambulatory Visit: Payer: 59 | Admitting: Internal Medicine

## 2020-06-20 ENCOUNTER — Other Ambulatory Visit: Payer: Self-pay | Admitting: Internal Medicine

## 2020-06-20 DIAGNOSIS — E782 Mixed hyperlipidemia: Secondary | ICD-10-CM

## 2020-06-20 DIAGNOSIS — F418 Other specified anxiety disorders: Secondary | ICD-10-CM

## 2020-06-20 NOTE — Telephone Encounter (Signed)
Requested medication (s) are due for refill today- yes  Requested medication (s) are on the active medication list -yes  Future visit scheduled -no  Last refill: 05/11/20  Notes to clinic: Request for non delegated Rx  Requested Prescriptions  Pending Prescriptions Disp Refills   clonazePAM (KLONOPIN) 0.25 MG disintegrating tablet [Pharmacy Med Name: CLONAZEPAM 0.25 MG ODT] 60 tablet 0    Sig: TAKE 1 TABLET (0.25 MG TOTAL) BY MOUTH 2 (TWO) TIMES DAILY AS NEEDED FOR SEIZURE.      Not Delegated - Psychiatry:  Anxiolytics/Hypnotics Failed - 06/20/2020 10:46 AM      Failed - This refill cannot be delegated      Failed - Urine Drug Screen completed in last 360 days.      Passed - Valid encounter within last 6 months    Recent Outpatient Visits           1 month ago Migraine without aura and without status migrainosus, not intractable   [facility] Reubin Milan, MD   3 months ago Current moderate episode of major depressive disorder without prior episode [facility])   [facility] Reubin Milan, MD   8 months ago Annual physical exam   [facility] Reubin Milan, MD   10 months ago Depression with anxiety   [facility] Reubin Milan, MD   1 year ago Current moderate episode of major depressive disorder without prior episode [facility])   [facility] Reubin Milan, MD               Signed Prescriptions Disp Refills   atorvastatin (LIPITOR) 40 MG tablet 90 tablet 0    Sig: TAKE 1 TABLET BY MOUTH EVERY DAY      Cardiovascular:  Antilipid - Statins Failed - 06/20/2020 10:46 AM      Failed - HDL in normal range and within 360 days    HDL  Date Value Ref Range Status  09/02/2019 33 (L) >39 mg/dL Final          Failed - Triglycerides in normal range and within 360 days    Triglycerides  Date Value Ref Range Status  09/02/2019 307 (H) 0 - 149 mg/dL Final          Passed - Total Cholesterol in normal range and within  360 days    Cholesterol, Total  Date Value Ref Range Status  09/02/2019 130 100 - 199 mg/dL Final          Passed - LDL in normal range and within 360 days    LDL Chol Calc (NIH)  Date Value Ref Range Status  09/02/2019 50 0 - 99 mg/dL Final   LDL Direct  Date Value Ref Range Status  09/02/2019 48 0 - 99 mg/dL Final          Passed - Patient is not pregnant      Passed - Valid encounter within last 12 months    Recent Outpatient Visits           1 month ago Migraine without aura and without status migrainosus, not intractable   [facility] Reubin Milan, MD   3 months ago Current moderate episode of major depressive disorder without prior episode [facility])   [facility] Reubin Milan, MD   8 months ago Annual physical exam   [facility] Reubin Milan, MD   10 months ago Depression with anxiety  [facility] Reubin Milan, MD   1 year ago Current moderate episode of major depressive disorder without prior episode [facility])   [facility] Reubin Milan, MD                  Requested Prescriptions  Pending Prescriptions Disp Refills   clonazePAM (KLONOPIN) 0.25 MG disintegrating tablet [Pharmacy Med Name: CLONAZEPAM 0.25 MG ODT] 60 tablet 0    Sig: TAKE 1 TABLET (0.25 MG TOTAL) BY MOUTH 2 (TWO) TIMES DAILY AS NEEDED FOR SEIZURE.      Not Delegated - Psychiatry:  Anxiolytics/Hypnotics Failed - 06/20/2020 10:46 AM      Failed - This refill cannot be delegated      Failed - Urine Drug Screen completed in last 360 days.      Passed - Valid encounter within last 6 months    Recent Outpatient Visits           1 month ago Migraine without aura and without status migrainosus, not intractable   [facility] Reubin Milan, MD   3 months ago Current moderate episode of major depressive disorder without prior episode [facility])   [facility] Reubin Milan, MD   8 months ago Annual physical  exam   [facility] Reubin Milan, MD   10 months ago Depression with anxiety   [facility] Reubin Milan, MD   1 year ago Current moderate episode of major depressive disorder without prior episode [facility])   [facility] Reubin Milan, MD               Signed Prescriptions Disp Refills   atorvastatin (LIPITOR) 40 MG tablet 90 tablet 0    Sig: TAKE 1 TABLET BY MOUTH EVERY DAY      Cardiovascular:  Antilipid - Statins Failed - 06/20/2020 10:46 AM      Failed - HDL in normal range and within 360 days    HDL  Date Value Ref Range Status  09/02/2019 33 (L) >39 mg/dL Final          Failed - Triglycerides in normal range and within 360 days    Triglycerides  Date Value Ref Range Status  09/02/2019 307 (H) 0 - 149 mg/dL Final          Passed - Total Cholesterol in normal range and within 360 days    Cholesterol, Total  Date Value Ref Range Status  09/02/2019 130 100 - 199 mg/dL Final          Passed - LDL in normal range and within 360 days    LDL Chol Calc (NIH)  Date Value Ref Range Status  09/02/2019 50 0 - 99 mg/dL Final   LDL Direct  Date Value Ref Range Status  09/02/2019 48 0 - 99 mg/dL Final          Passed - Patient is not pregnant      Passed - Valid encounter within last 12 months    Recent Outpatient Visits           1 month ago Migraine without aura and without status migrainosus, not intractable   [facility] Reubin Milan, MD   3 months ago Current moderate episode of major depressive disorder without prior episode [facility])   [facility] Reubin Milan, MD   8 months ago Annual physical exam   [facility] Reubin Milan, MD  10 months ago Depression with anxiety   [facility] Reubin Milan, MD   1 year ago Current moderate episode of major depressive disorder without prior episode [facility])   [facility] Reubin Milan, MD

## 2020-06-20 NOTE — Telephone Encounter (Signed)
Please Advise Last office visit was 05/11/2020.

## 2020-06-25 ENCOUNTER — Other Ambulatory Visit: Payer: Self-pay | Admitting: Internal Medicine

## 2020-06-25 DIAGNOSIS — F321 Major depressive disorder, single episode, moderate: Secondary | ICD-10-CM

## 2020-06-25 NOTE — Telephone Encounter (Signed)
Requested Prescriptions  Pending Prescriptions Disp Refills  . escitalopram (LEXAPRO) 20 MG tablet [Pharmacy Med Name: ESCITALOPRAM 20 MG TABLET] 90 tablet 1    Sig: TAKE 1 TABLET BY MOUTH DAILY     Psychiatry:  Antidepressants - SSRI Passed - 06/25/2020  3:06 PM      Passed - Completed PHQ-2 or PHQ-9 in the last 360 days.      Passed - Valid encounter within last 6 months    Recent Outpatient Visits          1 month ago Migraine without aura and without status migrainosus, not intractable   [facility] Reubin Milan, MD   3 months ago Current moderate episode of major depressive disorder without prior episode [facility])   [facility] Reubin Milan, MD   8 months ago Annual physical exam   [facility] Reubin Milan, MD   10 months ago Depression with anxiety   [facility] Reubin Milan, MD   1 year ago Current moderate episode of major depressive disorder without prior episode [facility])   [facility] Reubin Milan, MD

## 2020-07-10 ENCOUNTER — Ambulatory Visit (INDEPENDENT_AMBULATORY_CARE_PROVIDER_SITE_OTHER): Payer: 59 | Admitting: Internal Medicine

## 2020-07-10 ENCOUNTER — Other Ambulatory Visit: Payer: Self-pay

## 2020-07-10 ENCOUNTER — Encounter: Payer: Self-pay | Admitting: Internal Medicine

## 2020-07-10 VITALS — BP 122/74 | HR 85 | Temp 97.9°F | Ht 63.0 in | Wt 202.0 lb

## 2020-07-10 DIAGNOSIS — G5601 Carpal tunnel syndrome, right upper limb: Secondary | ICD-10-CM | POA: Diagnosis not present

## 2020-07-10 DIAGNOSIS — G43009 Migraine without aura, not intractable, without status migrainosus: Secondary | ICD-10-CM | POA: Diagnosis not present

## 2020-07-10 DIAGNOSIS — J40 Bronchitis, not specified as acute or chronic: Secondary | ICD-10-CM

## 2020-07-10 DIAGNOSIS — F418 Other specified anxiety disorders: Secondary | ICD-10-CM

## 2020-07-10 DIAGNOSIS — I25118 Atherosclerotic heart disease of native coronary artery with other forms of angina pectoris: Secondary | ICD-10-CM

## 2020-07-10 MED ORDER — DOXYCYCLINE HYCLATE 100 MG PO TABS: 100.0000 mg | ORAL_TABLET | Freq: Two times a day (BID) | ORAL | 0 refills | Status: AC

## 2020-07-10 MED ORDER — TOPIRAMATE 25 MG PO TABS: 25.0000 mg | ORAL_TABLET | Freq: Every day | ORAL | 3 refills | Status: DC

## 2020-07-10 MED ORDER — SUMATRIPTAN SUCCINATE 100 MG PO TABS: 100.0000 mg | ORAL_TABLET | ORAL | 5 refills | Status: DC | PRN

## 2020-07-10 NOTE — Progress Notes (Signed)
Date:  07/10/2020   Name:  Nancy Skinner   DOB:  February 03, 1966   MRN:  [id number]   Chief Complaint: Depression (follow up phq16) and Anxiety (gad-17)  Depression        This is a chronic problem.The problem is unchanged.  Associated symptoms include headaches.  Associated symptoms include no fatigue.  Past treatments include SSRIs - Selective serotonin reuptake inhibitors and other medications.  Compliance with treatment is good.  (hx of smoking) Migraine  This is a recurrent problem. The problem has been waxing and waning. The pain quality is similar to prior headaches. Associated symptoms include coughing, numbness and weakness. Pertinent negatives include no abdominal pain, dizziness or fever. She has tried triptans (Emgality too expensive but effective.  ) for the symptoms. (Hx of smoking)  Cough This is a new problem. The current episode started in the past 7 days. The problem has been gradually worsening. The problem occurs hourly. The cough is productive of sputum. Associated symptoms include headaches and wheezing. Pertinent negatives include no chest pain, chills, fever or shortness of breath. The symptoms are aggravated by exercise. She has tried nothing for the symptoms. Her past medical history is significant for COPD. hx of smoking  Weakness/pain - worsening in the right arm/hand.  It is not constant but becoming more of an issue.  Often drops things that she tries to pick up.  Has pain around the forearm and elbow.  Also has some spasm and pain on the right side of her neck in certain positions.  Lab Results  Component Value Date   CREATININE 0.77 09/02/2019   BUN 10 09/02/2019   NA 142 09/02/2019   K 4.9 09/02/2019   CL 103 09/02/2019   CO2 22 09/02/2019   Lab Results  Component Value Date   CHOL 130 09/02/2019   HDL 33 (L) 09/02/2019   LDLCALC 50 09/02/2019   LDLDIRECT 48 09/02/2019   TRIG 307 (H) 09/02/2019   CHOLHDL 3.9 09/02/2019   No results found for: TSH Lab  Results  Component Value Date   HGBA1C 5.9 06/26/2018   Lab Results  Component Value Date   WBC 13.7 (H) 08/07/2018   HGB 14.1 08/07/2018   HCT 43.4 08/07/2018   MCV 86 08/07/2018   PLT 301 08/07/2018   Lab Results  Component Value Date   ALT 27 09/02/2019   AST 18 09/02/2019   ALKPHOS 94 09/02/2019   BILITOT <0.2 09/02/2019     Review of Systems  Constitutional: Negative for chills, fatigue and fever.  Respiratory: Positive for cough, chest tightness and wheezing. Negative for shortness of breath.   Cardiovascular: Negative for chest pain, palpitations and leg swelling.  Gastrointestinal: Negative for abdominal pain, constipation and diarrhea.  Neurological: Positive for weakness, numbness and headaches. Negative for dizziness.  Psychiatric/Behavioral: Positive for depression, dysphoric mood and sleep disturbance. The patient is nervous/anxious.     Patient Active Problem List   Diagnosis Date Noted  . Screening for colon cancer   . Coronary artery disease, non-occlusive 01/18/2019  . Depression with anxiety 01/18/2019  . Neutrophilic leukocytosis 08/09/2018  . Pulmonary nodule less than 6 cm determined by computed tomography of lung 07/30/2018  . Night sweats 07/16/2018  . Neck muscle spasm 07/15/2018  . Mixed hyperlipidemia 07/15/2018  . Tobacco use disorder, mild, in early remission 07/15/2018  . Migraine without aura and without status migrainosus, not intractable 06/24/2018  . Gastroesophageal reflux disease without esophagitis 06/24/2018  . Chronic  midline low back pain without sciatica 06/24/2018    Allergies  Allergen Reactions  . Oxycodone Other (See Comments)    "Black outs" and confusion  . Dye Fdc Red [Red Dye] Other (See Comments)    Severe migraine    Past Surgical History:  Procedure Laterality Date  . ABDOMINAL HYSTERECTOMY  2012   partial for bleeding  . BACK SURGERY    . BREAST BIOPSY Right    neg  . BREAST BIOPSY Left 2015   4 bxs- neg   . BREAST SURGERY Left    multiple biopsies  . CARDIAC CATHETERIZATION    . CATARACT EXTRACTION Right 12/19/2010  . CHOLECYSTECTOMY  1999  . COLONOSCOPY WITH PROPOFOL N/A 03/31/2020   Procedure: COLONOSCOPY WITH PROPOFOL;  Surgeon: Midge Minium, MD;  Location: [facility] ENDOSCOPY;  Service: Endoscopy;  Laterality: N/A;  . EYE SURGERY    . INTRAVASCULAR PRESSURE WIRE/FFR STUDY N/A 07/22/2018   Procedure: INTRAVASCULAR PRESSURE WIRE/FFR STUDY;  Surgeon: Yvonne Kendall, MD;  Location: [facility] INVASIVE CV LAB;  Service: Cardiovascular;  Laterality: N/A;  . LEFT HEART CATH AND CORONARY ANGIOGRAPHY N/A 07/22/2018   Procedure: LEFT HEART CATH AND CORONARY ANGIOGRAPHY;  Surgeon: Yvonne Kendall, MD;  Location: [facility] INVASIVE CV LAB;  Service: Cardiovascular;  Laterality: N/A;  . POSTERIOR LUMBAR FUSION 4 LEVEL  2000    Social History   Tobacco Use  . Smoking status: Former Smoker    Packs/day: 0.25    Years: 40.00    Pack years: 10.00    Types: Cigarettes    Quit date: 09/18/2019    Years since quitting: 0.8  . Smokeless tobacco: Never Used  Vaping Use  . Vaping Use: Never used  Substance Use Topics  . Alcohol use: Yes    Comment: less than once a week.  . Drug use: Never     Medication list has been reviewed and updated.  Current Meds  Medication Sig  . aspirin EC 81 MG tablet Take 81 mg by mouth daily.  . Aspirin-Acetaminophen (GOODYS BODY PAIN PO) Take 1 packet by mouth daily. Every day.  Marland Kitchen atorvastatin (LIPITOR) 40 MG tablet TAKE 1 TABLET BY MOUTH EVERY DAY  . buPROPion (WELLBUTRIN XL) 300 MG 24 hr tablet TAKE 1 TABLET(300 MG) BY MOUTH DAILY  . clonazePAM (KLONOPIN) 0.25 MG disintegrating tablet Take 1 tablet (0.25 mg total) by mouth 2 (two) times daily as needed.  . cyclobenzaprine (FLEXERIL) 10 MG tablet TAKE 1 TABLET BY MOUTH THREE TIMES DAILY AS NEEDED FOR MUSCLE SPASMS  . escitalopram (LEXAPRO) 20 MG tablet TAKE 1 TABLET BY MOUTH DAILY  . Esomeprazole Magnesium (NEXIUM PO) Take 20  mg by mouth daily.   . Lidocaine 4 % PTCH Apply 1 patch topically daily as needed (pain).   . Multiple Vitamin (MULTIVITAMIN) capsule Take 1 capsule by mouth daily.  . nitroGLYCERIN (NITROSTAT) 0.4 MG SL tablet Place 1 tablet (0.4 mg total) under the tongue every 5 (five) minutes as needed for chest pain.  Marland Kitchen triamcinolone (KENALOG) 0.025 % ointment Apply 1 application topically as needed. For rash  . Ubrogepant (UBRELVY) 100 MG TABS Take 100 mg by mouth daily as needed.    PHQ 2/9 Scores 07/10/2020 05/11/2020 03/01/2020 10/08/2019  PHQ - 2 Score 4 6 6  0  PHQ- 9 Score 16 16 15  -    GAD 7 : Generalized Anxiety Score 07/10/2020 05/11/2020 03/01/2020 01/18/2019  Nervous, Anxious, on Edge 3 2 3 3   Control/stop worrying 3 3 3  3  Worry too much - different things 3 3 3 3   Trouble relaxing 2 3 3 3   Restless 0 3 0 3  Easily annoyed or irritable 3 3 3 3   Afraid - awful might happen 3 3 3 3   Total GAD 7 Score 17 20 18 21   Anxiety Difficulty Not difficult at all Somewhat difficult Very difficult Extremely difficult    BP Readings from Last 3 Encounters:  07/10/20 122/74  05/11/20 132/70  03/31/20 120/75    Physical Exam Vitals and nursing note reviewed.  Constitutional:      General: She is not in acute distress.    Appearance: Normal appearance. She is well-developed.  HENT:     Head: Normocephalic and atraumatic.  Cardiovascular:     Rate and Rhythm: Normal rate and regular rhythm.     Pulses: Normal pulses.     Heart sounds: No murmur heard.   Pulmonary:     Effort: Pulmonary effort is normal. No respiratory distress.     Breath sounds: Rhonchi present. No wheezing.  Musculoskeletal:     Right wrist: No swelling. Normal pulse.     Left wrist: No swelling. Normal pulse.     Cervical back: Normal range of motion.     Right lower leg: No edema.     Left lower leg: No edema.     Comments: Tinels and Phalens + on right Grip 4+/5 on right 5/5 on left   Lymphadenopathy:     Cervical:  No cervical adenopathy.  Skin:    General: Skin is warm and dry.     Capillary Refill: Capillary refill takes less than 2 seconds.     Findings: No rash.  Neurological:     Mental Status: She is alert and oriented to person, place, and time.  Psychiatric:        Behavior: Behavior normal.        Thought Content: Thought content normal.     Wt Readings from Last 3 Encounters:  07/10/20 202 lb (91.6 kg)  05/11/20 197 lb (89.4 kg)  03/31/20 195 lb 6.3 oz (88.6 kg)    BP 122/74   Pulse 85   Temp 97.9 F (36.6 C) (Oral)   Ht 5\' 3"  (1.6 m)   Wt 202 lb (91.6 kg)   SpO2 95%   BMI 35.78 kg/m   Assessment and Plan: 1. Carpal tunnel syndrome of right wrist Wear wrist splint while sleeping Take tylenol or Advil at HS and prn  2. Bronchitis - doxycycline (VIBRA-TABS) 100 MG tablet; Take 1 tablet (100 mg total) by mouth 2 (two) times daily for 10 days.  Dispense: 20 tablet; Refill: 0  3. Depression with anxiety Depression and anxiety sx are stable; current situtation with grandson is contributing significanty Continue clonopin PRN Continue Bupropion 300mg  and Lexapro 20 mg  4. Migraine without aura and without status migrainosus, not intractable Begin Topamax at HS - titrate up from 25 mg to 100 mg Use imitrex for abortive therapy - topiramate (TOPAMAX) 25 MG tablet; Take 1-4 tablets (25-100 mg total) by mouth at bedtime.  Dispense: 120 tablet; Refill: 3 - SUMAtriptan (IMITREX) 100 MG tablet; Take 1 tablet (100 mg total) by mouth every 2 (two) hours as needed for migraine. May repeat in 2 hours if headache persists or recurs.  Dispense: 10 tablet; Refill: 5  5. Coronary artery disease of native artery of native heart with stable angina pectoris (HCC) Stable without current symptoms Continue aspirin and lipitor  Partially dictated using Animal nutritionist. Any errors are unintentional.  Bari Edward, MD [facility]  07/10/2020

## 2020-07-10 NOTE — Patient Instructions (Signed)
Titrate up the Topiramate - start with one at bedtime and increase every week to a maximum of 4.  Wear a cock-up wrist splint on the right while sleeping to improve carpal tunnel symptoms.

## 2020-08-03 ENCOUNTER — Telehealth: Payer: Self-pay | Admitting: Internal Medicine

## 2020-08-03 ENCOUNTER — Other Ambulatory Visit: Payer: Self-pay | Admitting: Internal Medicine

## 2020-08-03 ENCOUNTER — Other Ambulatory Visit: Payer: Self-pay

## 2020-08-03 DIAGNOSIS — J40 Bronchitis, not specified as acute or chronic: Secondary | ICD-10-CM

## 2020-08-03 MED ORDER — AMOXICILLIN-POT CLAVULANATE 875-125 MG PO TABS: 1.0000 | ORAL_TABLET | Freq: Two times a day (BID) | ORAL | 0 refills | Status: DC

## 2020-08-03 MED ORDER — AMOXICILLIN-POT CLAVULANATE 875-125 MG PO TABS: 1.0000 | ORAL_TABLET | Freq: Two times a day (BID) | ORAL | 0 refills | Status: AC

## 2020-08-03 NOTE — Telephone Encounter (Signed)
Sent new Rx to [facility].

## 2020-08-03 NOTE — Telephone Encounter (Signed)
Please Advise.  KP

## 2020-08-03 NOTE — Telephone Encounter (Signed)
Called pt told her that Dr.B sent in a new prescription. Pt verbalized understanding.  KP

## 2020-08-03 NOTE — Telephone Encounter (Signed)
Pt came in, states medication given at last visit is not helping.  Would like something else called in. Please Advise?  doxycycline (VIBRA-TABS) 100 MG tablet [[id number]]

## 2020-09-11 ENCOUNTER — Encounter: Payer: Self-pay | Admitting: Internal Medicine

## 2020-09-11 ENCOUNTER — Ambulatory Visit (INDEPENDENT_AMBULATORY_CARE_PROVIDER_SITE_OTHER): Payer: 59 | Admitting: Internal Medicine

## 2020-09-11 ENCOUNTER — Other Ambulatory Visit: Payer: Self-pay

## 2020-09-11 VITALS — BP 138/86 | HR 93 | Ht 63.0 in | Wt 198.0 lb

## 2020-09-11 DIAGNOSIS — F324 Major depressive disorder, single episode, in partial remission: Secondary | ICD-10-CM | POA: Diagnosis not present

## 2020-09-11 DIAGNOSIS — R911 Solitary pulmonary nodule: Secondary | ICD-10-CM

## 2020-09-11 DIAGNOSIS — B354 Tinea corporis: Secondary | ICD-10-CM | POA: Diagnosis not present

## 2020-09-11 DIAGNOSIS — G43009 Migraine without aura, not intractable, without status migrainosus: Secondary | ICD-10-CM | POA: Diagnosis not present

## 2020-09-11 DIAGNOSIS — IMO0001 Reserved for inherently not codable concepts without codable children: Secondary | ICD-10-CM

## 2020-09-11 MED ORDER — CLOTRIMAZOLE-BETAMETHASONE 1-0.05 % EX CREA: 1.0000 "application " | TOPICAL_CREAM | Freq: Two times a day (BID) | CUTANEOUS | 0 refills | Status: DC

## 2020-09-11 NOTE — Progress Notes (Signed)
Date:  09/11/2020   Name:  Nancy Skinner   DOB:  06/23/1966   MRN:  [id number]   Chief Complaint: Depression (Follow up. ), Anxiety (Follow up. ), and Bleeding/Bruising (Bruising on thighs and under breast. Started 3 weeks ago - unsure where they are coming from. )  Migraine  This is a recurrent problem. The problem has been gradually improving (since adding Topamax). The pain is located in the temporal region. The pain does not radiate. The pain quality is similar to prior headaches. Associated symptoms include photophobia (with migraines) and scalp tenderness. Pertinent negatives include no abdominal pain, coughing, dizziness, fever or nausea. She has tried triptans (Topiramate started last visit; Ubrelvy not covered; Emgality too expensive) for the symptoms.  Depression        This is a chronic problem.The problem is unchanged.  Associated symptoms include headaches.  Associated symptoms include no fatigue.  Past treatments include SSRIs - Selective serotonin reuptake inhibitors and other medications.  Compliance with treatment is good. Rash This is a new problem. The affected locations include the left upper leg and right upper leg. The rash is characterized by scaling and redness. She was exposed to nothing. Pertinent negatives include no cough, fatigue, fever or shortness of breath. Past treatments include nothing.  Pulmonary nodule - 3 mm LL nodules seen on CT in 2019.  No follow up has been done.  Lab Results  Component Value Date   CREATININE 0.77 09/02/2019   BUN 10 09/02/2019   NA 142 09/02/2019   K 4.9 09/02/2019   CL 103 09/02/2019   CO2 22 09/02/2019   Lab Results  Component Value Date   CHOL 130 09/02/2019   HDL 33 (L) 09/02/2019   LDLCALC 50 09/02/2019   LDLDIRECT 48 09/02/2019   TRIG 307 (H) 09/02/2019   CHOLHDL 3.9 09/02/2019   No results found for: TSH Lab Results  Component Value Date   HGBA1C 5.9 06/26/2018   Lab Results  Component Value Date   WBC  13.7 (H) 08/07/2018   HGB 14.1 08/07/2018   HCT 43.4 08/07/2018   MCV 86 08/07/2018   PLT 301 08/07/2018   Lab Results  Component Value Date   ALT 27 09/02/2019   AST 18 09/02/2019   ALKPHOS 94 09/02/2019   BILITOT <0.2 09/02/2019     Review of Systems  Constitutional: Negative for chills, fatigue and fever.  Eyes: Positive for photophobia (with migraines).  Respiratory: Negative for cough, chest tightness, shortness of breath and wheezing.   Cardiovascular: Negative for chest pain and palpitations.  Gastrointestinal: Negative for abdominal pain, blood in stool, constipation and nausea.  Musculoskeletal: Negative for arthralgias and gait problem.  Skin: Positive for rash.  Neurological: Positive for headaches. Negative for dizziness, syncope and light-headedness.  Psychiatric/Behavioral: Positive for depression.    Patient Active Problem List   Diagnosis Date Noted  . Carpal tunnel syndrome of right wrist 07/10/2020  . Screening for colon cancer   . Coronary artery disease of native artery of native heart with stable angina pectoris (HCC) 01/18/2019  . Major depression single episode, in partial remission (HCC) 01/18/2019  . Neutrophilic leukocytosis 08/09/2018  . Pulmonary nodule less than 6 cm determined by computed tomography of lung 07/30/2018  . Night sweats 07/16/2018  . Neck muscle spasm 07/15/2018  . Mixed hyperlipidemia 07/15/2018  . Tobacco use disorder, mild, in early remission 07/15/2018  . Migraine without aura and without status migrainosus, not intractable 06/24/2018  . Gastroesophageal  reflux disease without esophagitis 06/24/2018  . Chronic midline low back pain without sciatica 06/24/2018    Allergies  Allergen Reactions  . Oxycodone Other (See Comments)    "Black outs" and confusion    Past Surgical History:  Procedure Laterality Date  . ABDOMINAL HYSTERECTOMY  2012   partial for bleeding  . BACK SURGERY    . BREAST BIOPSY Right    neg  .  BREAST BIOPSY Left 2015   4 bxs- neg  . BREAST SURGERY Left    multiple biopsies  . CARDIAC CATHETERIZATION    . CATARACT EXTRACTION Right 12/19/2010  . CHOLECYSTECTOMY  1999  . COLONOSCOPY WITH PROPOFOL N/A 03/31/2020   Procedure: COLONOSCOPY WITH PROPOFOL;  Surgeon: Midge Minium, MD;  Location: [facility] ENDOSCOPY;  Service: Endoscopy;  Laterality: N/A;  . EYE SURGERY    . INTRAVASCULAR PRESSURE WIRE/FFR STUDY N/A 07/22/2018   Procedure: INTRAVASCULAR PRESSURE WIRE/FFR STUDY;  Surgeon: Yvonne Kendall, MD;  Location: [facility] INVASIVE CV LAB;  Service: Cardiovascular;  Laterality: N/A;  . LEFT HEART CATH AND CORONARY ANGIOGRAPHY N/A 07/22/2018   Procedure: LEFT HEART CATH AND CORONARY ANGIOGRAPHY;  Surgeon: Yvonne Kendall, MD;  Location: [facility] INVASIVE CV LAB;  Service: Cardiovascular;  Laterality: N/A;  . POSTERIOR LUMBAR FUSION 4 LEVEL  2000    Social History   Tobacco Use  . Smoking status: Former Smoker    Packs/day: 0.25    Years: 40.00    Pack years: 10.00    Types: Cigarettes    Quit date: 09/18/2019    Years since quitting: 0.9  . Smokeless tobacco: Never Used  Vaping Use  . Vaping Use: Never used  Substance Use Topics  . Alcohol use: Yes    Comment: less than once a week.  . Drug use: Never     Medication list has been reviewed and updated.  Current Meds  Medication Sig  . aspirin EC 81 MG tablet Take 81 mg by mouth daily.  . Aspirin-Acetaminophen (GOODYS BODY PAIN PO) Take 1 packet by mouth daily. Every day.  Marland Kitchen atorvastatin (LIPITOR) 40 MG tablet TAKE 1 TABLET BY MOUTH EVERY DAY  . buPROPion (WELLBUTRIN XL) 300 MG 24 hr tablet TAKE 1 TABLET(300 MG) BY MOUTH DAILY  . clonazePAM (KLONOPIN) 0.25 MG disintegrating tablet Take 1 tablet (0.25 mg total) by mouth 2 (two) times daily as needed.  . cyclobenzaprine (FLEXERIL) 10 MG tablet TAKE 1 TABLET BY MOUTH THREE TIMES DAILY AS NEEDED FOR MUSCLE SPASMS  . escitalopram (LEXAPRO) 20 MG tablet TAKE 1 TABLET BY MOUTH DAILY  .  Esomeprazole Magnesium (NEXIUM PO) Take 20 mg by mouth daily.   . Lidocaine 4 % PTCH Apply 1 patch topically daily as needed (pain).   . Multiple Vitamin (MULTIVITAMIN) capsule Take 1 capsule by mouth daily.  . nitroGLYCERIN (NITROSTAT) 0.4 MG SL tablet Place 1 tablet (0.4 mg total) under the tongue every 5 (five) minutes as needed for chest pain.  . SUMAtriptan (IMITREX) 100 MG tablet Take 1 tablet (100 mg total) by mouth every 2 (two) hours as needed for migraine. May repeat in 2 hours if headache persists or recurs.  . topiramate (TOPAMAX) 25 MG tablet Take 1-4 tablets (25-100 mg total) by mouth at bedtime.  . triamcinolone (KENALOG) 0.025 % ointment Apply 1 application topically as needed. For rash  . Ubrogepant (UBRELVY) 100 MG TABS Take 100 mg by mouth daily as needed.    PHQ 2/9 Scores 09/11/2020 07/10/2020 05/11/2020 03/01/2020  PHQ - 2  Score 4 4 6 6   PHQ- 9 Score 7 16 16 15     GAD 7 : Generalized Anxiety Score 09/11/2020 07/10/2020 05/11/2020 03/01/2020  Nervous, Anxious, on Edge 3 3 2 3   Control/stop worrying 3 3 3 3   Worry too much - different things 3 3 3 3   Trouble relaxing 1 2 3 3   Restless 1 0 3 0  Easily annoyed or irritable 3 3 3 3   Afraid - awful might happen 3 3 3 3   Total GAD 7 Score 17 17 20 18   Anxiety Difficulty Somewhat difficult Not difficult at all Somewhat difficult Very difficult    BP Readings from Last 3 Encounters:  09/11/20 138/86  07/10/20 122/74  05/11/20 132/70    Physical Exam Vitals and nursing note reviewed.  Constitutional:      General: She is not in acute distress.    Appearance: Normal appearance. She is well-developed.  HENT:     Head: Normocephalic and atraumatic.  Cardiovascular:     Rate and Rhythm: Normal rate and regular rhythm.     Pulses: Normal pulses.     Heart sounds: No murmur heard.   Pulmonary:     Effort: Pulmonary effort is normal. No respiratory distress.     Breath sounds: No wheezing or rhonchi.  Musculoskeletal:          General: Normal range of motion.     Cervical back: Normal range of motion.     Right lower leg: No edema.     Left lower leg: No edema.  Skin:    General: Skin is warm and dry.     Capillary Refill: Capillary refill takes less than 2 seconds.     Coloration: Skin is not jaundiced.     Findings: Rash present. No bruising.  Neurological:     General: No focal deficit present.     Mental Status: She is alert and oriented to person, place, and time.  Psychiatric:        Mood and Affect: Mood normal.        Behavior: Behavior normal.     Wt Readings from Last 3 Encounters:  09/11/20 198 lb (89.8 kg)  07/10/20 202 lb (91.6 kg)  05/11/20 197 lb (89.4 kg)    BP 138/86   Pulse 93   Ht 5\' 3"  (1.6 m)   Wt 198 lb (89.8 kg)   SpO2 99%   BMI 35.07 kg/m   Assessment and Plan: 1. Major depression single episode, in partial remission (HCC) Doing fairly well on current therapy but still symptomatic Continue current medications and check thyroid - TSH + free T4  2. Migraine without aura and without status migrainosus, not intractable Much improved with addition of Topamax Continue Imitrex PRN  3. Pulmonary nodule less than 6 cm determined by computed tomography of lung - CT Chest Wo Contrast; Future - CBC with Differential/Platelet  4. Tinea corporis Rash appears to be fungal - Comprehensive metabolic panel - clotrimazole-betamethasone (LOTRISONE) cream; Apply 1 application topically 2 (two) times daily. To rash on legs  Dispense: 30 g; Refill: 0   Partially dictated using . Any errors are unintentional.  , MD [facility]  09/11/2020

## 2020-09-12 LAB — COMPREHENSIVE METABOLIC PANEL
ALT: 12 IU/L (ref 0–32)
AST: 12 IU/L (ref 0–40)
Albumin/Globulin Ratio: 1.8 (ref 1.2–2.2)
Albumin: 4.4 g/dL (ref 3.8–4.9)
Alkaline Phosphatase: 83 IU/L (ref 44–121)
BUN/Creatinine Ratio: 15 (ref 9–23)
BUN: 12 mg/dL (ref 6–24)
Bilirubin Total: <0.2 mg/dL (ref 0.0–1.2)
CO2: 20 mmol/L (ref 20–29)
Calcium: 9.1 mg/dL (ref 8.7–10.2)
Chloride: 102 mmol/L (ref 96–106)
Creatinine, Ser: 0.78 mg/dL (ref 0.57–1.00)
GFR calc Af Amer: 100 mL/min/{1.73_m2} (ref >59)
GFR calc non Af Amer: 87 mL/min/{1.73_m2} (ref >59)
Globulin, Total: 2.5 g/dL (ref 1.5–4.5)
Glucose: 93 mg/dL (ref 65–99)
Potassium: 3.4 mmol/L — ABNORMAL LOW (ref 3.5–5.2)
Sodium: 138 mmol/L (ref 134–144)
Total Protein: 6.9 g/dL (ref 6.0–8.5)

## 2020-09-12 LAB — CBC WITH DIFFERENTIAL/PLATELET
Basophils Absolute: 0.1 10*3/uL (ref 0.0–0.2)
Basos: 1 %
EOS (ABSOLUTE): 0.5 10*3/uL — ABNORMAL HIGH (ref 0.0–0.4)
Eos: 3 %
Hematocrit: 40.2 % (ref 34.0–46.6)
Hemoglobin: 13.3 g/dL (ref 11.1–15.9)
Immature Grans (Abs): 0.1 10*3/uL (ref 0.0–0.1)
Immature Granulocytes: 1 %
Lymphocytes Absolute: 6.3 10*3/uL — ABNORMAL HIGH (ref 0.7–3.1)
Lymphs: 48 %
MCH: 28.9 pg (ref 26.6–33.0)
MCHC: 33.1 g/dL (ref 31.5–35.7)
MCV: 87 fL (ref 79–97)
Monocytes Absolute: 0.7 10*3/uL (ref 0.1–0.9)
Monocytes: 5 %
Neutrophils Absolute: 5.6 10*3/uL (ref 1.4–7.0)
Neutrophils: 42 %
Platelets: 291 10*3/uL (ref 150–450)
RBC: 4.61 x10E6/uL (ref 3.77–5.28)
RDW: 15.2 % (ref 11.7–15.4)
WBC: 13.2 10*3/uL — ABNORMAL HIGH (ref 3.4–10.8)

## 2020-09-12 LAB — TSH+FREE T4
Free T4: 0.9 ng/dL (ref 0.82–1.77)
TSH: 4.07 u[IU]/mL (ref 0.450–4.500)

## 2020-09-14 ENCOUNTER — Ambulatory Visit: Payer: 59

## 2020-09-20 ENCOUNTER — Other Ambulatory Visit: Payer: Self-pay

## 2020-09-20 ENCOUNTER — Ambulatory Visit
Admission: RE | Admit: 2020-09-20 | Discharge: 2020-09-20 | Disposition: A | Discharge status: Home / Self Care | Payer: 59 | Source: Ambulatory Visit | Attending: Internal Medicine | Admitting: Internal Medicine

## 2020-09-20 DIAGNOSIS — R911 Solitary pulmonary nodule: Secondary | ICD-10-CM | POA: Insufficient documentation

## 2020-09-20 DIAGNOSIS — IMO0001 Reserved for inherently not codable concepts without codable children: Secondary | ICD-10-CM

## 2020-09-21 ENCOUNTER — Ambulatory Visit: Payer: 59 | Admitting: Internal Medicine

## 2020-09-21 NOTE — Progress Notes (Deleted)
Follow-up Outpatient Visit Date: 09/21/2020  Primary Care Provider: Reubin Milan, MD [address] [place] [address]  Chief Complaint: ***  HPI:  Nancy Skinner is a 54 y.o. female with history of nonobstructive coronary artery disease, hyperlipidemia, migraine disorder, and depression, who presents for follow-up of coronary artery disease.  I last saw her a year ago, which time Nancy Skinner was feeling well other than some chronic fatigue.  Her energy had improved since metoprolol was decreased.  Headaches had also improved with discontinuation of isosorbide mononitrate.  We did not make any medication changes or pursue additional testing.  --------------------------------------------------------------------------------------------------  Past Medical History:  Diagnosis Date  . Coronary artery disease, non-occlusive    a. LHC 07/22/18: LM nl, mLAD 60% w/ FFR 0.83, LCx nl, RCA nl, EF 55-65%, nl LVEDP, no AS.  . Depression   . Lumbar disc disease   . Migraines   . Mixed hyperlipidemia    Past Surgical History:  Procedure Laterality Date  . ABDOMINAL HYSTERECTOMY  2012   partial for bleeding  . BACK SURGERY    . BREAST BIOPSY Right    neg  . BREAST BIOPSY Left 2015   4 bxs- neg  . BREAST SURGERY Left    multiple biopsies  . CARDIAC CATHETERIZATION    . CATARACT EXTRACTION Right 12/19/2010  . CHOLECYSTECTOMY  1999  . COLONOSCOPY WITH PROPOFOL N/A 03/31/2020   Procedure: COLONOSCOPY WITH PROPOFOL;  Surgeon: Midge Minium, MD;  Location: [facility] ENDOSCOPY;  Service: Endoscopy;  Laterality: N/A;  . EYE SURGERY    . INTRAVASCULAR PRESSURE WIRE/FFR STUDY N/A 07/22/2018   Procedure: INTRAVASCULAR PRESSURE WIRE/FFR STUDY;  Surgeon: Yvonne Kendall, MD;  Location: [facility] INVASIVE CV LAB;  Service: Cardiovascular;  Laterality: N/A;  . LEFT HEART CATH AND CORONARY ANGIOGRAPHY N/A 07/22/2018   Procedure: LEFT HEART CATH AND CORONARY ANGIOGRAPHY;  Surgeon: Yvonne Kendall, MD;   Location: [facility] INVASIVE CV LAB;  Service: Cardiovascular;  Laterality: N/A;  . POSTERIOR LUMBAR FUSION 4 LEVEL  2000    No outpatient medications have been marked as taking for the 09/21/20 encounter (Appointment) with Galina Haddox, Cristal Deer, MD.    Allergies: Oxycodone  Social History   Tobacco Use  . Smoking status: Former Smoker    Packs/day: 0.25    Years: 40.00    Pack years: 10.00    Types: Cigarettes    Quit date: 09/18/2019    Years since quitting: 1.0  . Smokeless tobacco: Never Used  Vaping Use  . Vaping Use: Never used  Substance Use Topics  . Alcohol use: Yes    Comment: less than once a week.  . Drug use: Never    Family History  Problem Relation Age of Onset  . Breast cancer Mother 56  . Heart attack Father 43  . Alcohol abuse Father   . Breast cancer Maternal Grandmother   . Breast cancer Paternal Grandmother     Review of Systems: A 12-system review of systems was performed and was negative except as noted in the HPI.  --------------------------------------------------------------------------------------------------  Physical Exam: There were no vitals taken for this visit.  General:  *** HEENT: No conjunctival pallor or scleral icterus. Facemask in place. Neck: Supple without lymphadenopathy, thyromegaly, JVD, or HJR. Lungs: Normal work of breathing. Clear to auscultation bilaterally without wheezes or crackles. Heart: Regular rate and rhythm without murmurs, rubs, or gallops. Non-displaced PMI. Abd: Bowel sounds present. Soft, NT/ND without hepatosplenomegaly Ext: No lower extremity edema. Radial, PT, and  DP pulses are 2+ bilaterally. Skin: Warm and dry without rash.  EKG:  ***  Lab Results  Component Value Date   WBC 13.2 (H) 09/11/2020   HGB 13.3 09/11/2020   HCT 40.2 09/11/2020   MCV 87 09/11/2020   PLT 291 09/11/2020    Lab Results  Component Value Date   NA 138 09/11/2020   K 3.4 (L) 09/11/2020   CL 102 09/11/2020   CO2 20  09/11/2020   BUN 12 09/11/2020   CREATININE 0.78 09/11/2020   GLUCOSE 93 09/11/2020   ALT 12 09/11/2020    Lab Results  Component Value Date   CHOL 130 09/02/2019   HDL 33 (L) 09/02/2019   LDLCALC 50 09/02/2019   LDLDIRECT 48 09/02/2019   TRIG 307 (H) 09/02/2019   CHOLHDL 3.9 09/02/2019    --------------------------------------------------------------------------------------------------  ASSESSMENT AND PLAN: Cristal Deer Briant Angelillo, MD 09/21/2020 7:17 AM

## 2020-09-27 ENCOUNTER — Ambulatory Visit: Payer: 59 | Admitting: Internal Medicine

## 2020-10-02 ENCOUNTER — Other Ambulatory Visit: Payer: Self-pay | Admitting: Internal Medicine

## 2020-10-02 DIAGNOSIS — G43009 Migraine without aura, not intractable, without status migrainosus: Secondary | ICD-10-CM

## 2020-10-25 ENCOUNTER — Other Ambulatory Visit: Payer: Self-pay | Admitting: Internal Medicine

## 2020-10-25 DIAGNOSIS — E782 Mixed hyperlipidemia: Secondary | ICD-10-CM

## 2020-10-25 NOTE — Telephone Encounter (Signed)
Requested medications are due for refill today?  Yes  Requested medications are on active medication list?  Yes  Last Refill:  06/20/2020  # 90 with no refills   Future visit scheduled?   Yes in 2 months   Notes to Clinic:    Medication failed Rx refill protocol due to no labs within the past 360 days.  Last labs were performed on 09/02/2019.

## 2020-11-13 ENCOUNTER — Other Ambulatory Visit: Payer: Self-pay | Admitting: Internal Medicine

## 2020-11-13 DIAGNOSIS — F17200 Nicotine dependence, unspecified, uncomplicated: Secondary | ICD-10-CM

## 2020-11-18 ENCOUNTER — Ambulatory Visit
Admission: EM | Admit: 2020-11-18 | Discharge: 2020-11-18 | Disposition: A | Discharge status: Home / Self Care | Payer: 59 | Attending: Emergency Medicine | Admitting: Emergency Medicine

## 2020-11-18 ENCOUNTER — Other Ambulatory Visit: Payer: Self-pay | Admitting: Internal Medicine

## 2020-11-18 ENCOUNTER — Ambulatory Visit (INDEPENDENT_AMBULATORY_CARE_PROVIDER_SITE_OTHER): Payer: 59

## 2020-11-18 ENCOUNTER — Other Ambulatory Visit: Payer: Self-pay

## 2020-11-18 DIAGNOSIS — B354 Tinea corporis: Secondary | ICD-10-CM

## 2020-11-18 DIAGNOSIS — M109 Gout, unspecified: Secondary | ICD-10-CM | POA: Insufficient documentation

## 2020-11-18 DIAGNOSIS — G8929 Other chronic pain: Secondary | ICD-10-CM

## 2020-11-18 DIAGNOSIS — G5601 Carpal tunnel syndrome, right upper limb: Secondary | ICD-10-CM

## 2020-11-18 DIAGNOSIS — M25531 Pain in right wrist: Secondary | ICD-10-CM | POA: Diagnosis not present

## 2020-11-18 LAB — CBC WITH DIFFERENTIAL/PLATELET
Abs Immature Granulocytes: 0.09 10*3/uL — ABNORMAL HIGH (ref 0.00–0.07)
Basophils Absolute: 0.1 10*3/uL (ref 0.0–0.1)
Basophils Relative: 1 %
Eosinophils Absolute: 0.4 10*3/uL (ref 0.0–0.5)
Eosinophils Relative: 4 %
HCT: 41.7 % (ref 36.0–46.0)
Hemoglobin: 13.5 g/dL (ref 12.0–15.0)
Immature Granulocytes: 1 %
Lymphocytes Relative: 32 %
Lymphs Abs: 3.7 10*3/uL (ref 0.7–4.0)
MCH: 27.8 pg (ref 26.0–34.0)
MCHC: 32.4 g/dL (ref 30.0–36.0)
MCV: 86 fL (ref 80.0–100.0)
Monocytes Absolute: 0.6 10*3/uL (ref 0.1–1.0)
Monocytes Relative: 5 %
Neutro Abs: 6.7 10*3/uL (ref 1.7–7.7)
Neutrophils Relative %: 57 %
Platelets: 402 10*3/uL — ABNORMAL HIGH (ref 150–400)
RBC: 4.85 MIL/uL (ref 3.87–5.11)
RDW: 14.4 % (ref 11.5–15.5)
WBC: 11.6 10*3/uL — ABNORMAL HIGH (ref 4.0–10.5)
nRBC: 0 % (ref 0.0–0.2)

## 2020-11-18 LAB — URIC ACID: Uric Acid, Serum: 5.2 mg/dL (ref 2.5–7.1)

## 2020-11-18 LAB — SEDIMENTATION RATE: Sed Rate: 44 mm/hr — ABNORMAL HIGH (ref 0–30)

## 2020-11-18 MED ORDER — METHYLPREDNISOLONE 4 MG PO TBPK: ORAL_TABLET | ORAL | 0 refills | Status: DC

## 2020-11-18 MED ORDER — KETOROLAC TROMETHAMINE 60 MG/2ML IM SOLN
60.0000 mg | Freq: Once | INTRAMUSCULAR | Status: AC
  Administered 2020-11-18: 60 mg via INTRAMUSCULAR

## 2020-11-18 NOTE — ED Triage Notes (Signed)
Pt presents with c/o painful right wrist that started yesterday. Pt denies any known injury. Pt does have swollen fingers and some swelling around the wrist. Pt does have reduced ROM in the wrist and fingers. Pt applies a wrist splint last night with no relief. Pt states pain is unbearable. Pt denies any history of gout.

## 2020-11-18 NOTE — Telephone Encounter (Signed)
Requested medication (s) are due for refill today: yes  Requested medication (s) are on the active medication list: yes  Last refill:  clotrimazole-betamethasone: 09/11/20    cyclobenzaprine: 03/22/20  Future visit scheduled: yes  Notes to clinic:  med not assigned to a protocol and med not delegated to NT to RF   Requested Prescriptions  Pending Prescriptions Disp Refills   clotrimazole-betamethasone (LOTRISONE) cream [Pharmacy Med Name: CLOTRIMAZOLE-BETAMETHASONE CRM] 30 g 0    Sig: Apply 1 application topically 2 (two) times daily. To rash on legs      Off-Protocol Failed - 11/18/2020 10:56 AM      Failed - Medication not assigned to a protocol, review manually.      Passed - Valid encounter within last 12 months    Recent Outpatient Visits           2 months ago Migraine without aura and without status migrainosus, not intractable   [facility] Reubin Milan, MD   4 months ago Carpal tunnel syndrome of right wrist   [facility] Reubin Milan, MD   6 months ago Migraine without aura and without status migrainosus, not intractable   [facility] Reubin Milan, MD   8 months ago Current moderate episode of major depressive disorder without prior episode [facility])   [facility] Reubin Milan, MD   1 year ago Annual physical exam   [facility] Reubin Milan, MD       Future Appointments             In 2 months Reubin Milan, MD [facility]              cyclobenzaprine (FLEXERIL) 10 MG tablet [Pharmacy Med Name: CYCLOBENZAPRINE 10 MG TABLET] 90 tablet 1    Sig: TAKE 1 TABLET BY MOUTH 3 TIMES DAILY AS NEEDED FOR MUSCLE SPASMS      Not Delegated - Analgesics:  Muscle Relaxants Failed - 11/18/2020 10:56 AM      Failed - This refill cannot be delegated      Passed - Valid encounter within last 6 months    Recent Outpatient Visits           2 months ago Migraine without aura and without  status migrainosus, not intractable   [facility] Reubin Milan, MD   4 months ago Carpal tunnel syndrome of right wrist   [facility] Reubin Milan, MD   6 months ago Migraine without aura and without status migrainosus, not intractable   [facility] Reubin Milan, MD   8 months ago Current moderate episode of major depressive disorder without prior episode [facility])   [facility] Reubin Milan, MD   1 year ago Annual physical exam   [facility] Reubin Milan, MD       Future Appointments             In 2 months Judithann Graves Nyoka Cowden, MD [facility]

## 2020-11-18 NOTE — ED Provider Notes (Signed)
[facility]    CSN: [id number] Arrival date & time: 11/18/20  0845      History   Chief Complaint Chief Complaint  Patient presents with  . Wrist Pain    HPI Nancy Skinner is a 54 y.o. female.   HPI   54 year old female here for evaluation of pain and swelling in her right wrist.  Patient reports that her symptoms started yesterday abruptly while she was out at dinner.  She noticed that she was unable to use her knife to cut her meal and had to have her husband do it for her.  Patient has had some similar symptoms in the past and saw her PCP.  She reports that her PCP thought it might be carpal tunnel and instructed her to get a wrist brace.  She use the wrist brace and it helped.  She use the wrist brace with this episode, in conjunction with lidocaine patch, and there was no improvement of pain or swelling.  Patient denies numbness or tingling in her fingers.  Patient also denies trauma to that wrist.  Patient's right wrist is hot and mildly red.  Patient has swelling to her hand and fingers.  Patient has an inability to completely close her right hand due to the swelling.    Past Medical History:  Diagnosis Date  . Coronary artery disease, non-occlusive    a. LHC 07/22/18: LM nl, mLAD 60% w/ FFR 0.83, LCx nl, RCA nl, EF 55-65%, nl LVEDP, no AS.  . Depression   . Lumbar disc disease   . Migraines   . Mixed hyperlipidemia     Patient Active Problem List   Diagnosis Date Noted  . Carpal tunnel syndrome of right wrist 07/10/2020  . Screening for colon cancer   . Coronary artery disease of native artery of native heart with stable angina pectoris ([place]) 01/18/2019  . Major depression single episode, in partial remission ([place]) 01/18/2019  . Neutrophilic leukocytosis 37/34/2876  . Pulmonary nodule less than 6 cm determined by computed tomography of lung 07/30/2018  . Night sweats 07/16/2018  . Neck muscle spasm 07/15/2018  . Mixed hyperlipidemia 07/15/2018  .  Tobacco use disorder, mild, in early remission 07/15/2018  . Migraine without aura and without status migrainosus, not intractable 06/24/2018  . Gastroesophageal reflux disease without esophagitis 06/24/2018  . Chronic midline low back pain without sciatica 06/24/2018    Past Surgical History:  Procedure Laterality Date  . ABDOMINAL HYSTERECTOMY  2012   partial for bleeding  . BACK SURGERY    . BREAST BIOPSY Right    neg  . BREAST BIOPSY Left 2015   4 bxs- neg  . BREAST SURGERY Left    multiple biopsies  . CARDIAC CATHETERIZATION    . CATARACT EXTRACTION Right 12/19/2010  . CHOLECYSTECTOMY  1999  . COLONOSCOPY WITH PROPOFOL N/A 03/31/2020   Procedure: COLONOSCOPY WITH PROPOFOL;  Surgeon: Lucilla Lame, MD;  Location[facility] ENDOSCOPY;  Service: Endoscopy;  Laterality: N/A;  . EYE SURGERY    . INTRAVASCULAR PRESSURE WIRE/FFR STUDY N/A 07/22/2018   Procedure: INTRAVASCULAR PRESSURE WIRE/FFR STUDY;  Surgeon: Nelva Bush, MD;  Location: [place] CV LAB;  Service: Cardiovascular;  Laterality: N/A;  . LEFT HEART CATH AND CORONARY ANGIOGRAPHY N/A 07/22/2018   Procedure: LEFT HEART CATH AND CORONARY ANGIOGRAPHY;  Surgeon: Nelva Bush, MD;  Location: [place] CV LAB;  Service: Cardiovascular;  Laterality: N/A;  . POSTERIOR LUMBAR FUSION 4 LEVEL  2000    OB History  No obstetric history on file.      Home Medications    Prior to Admission medications   Medication Sig Start Date End Date Taking? Authorizing Provider  aspirin EC 81 MG tablet Take 81 mg by mouth daily.   Yes [provider]  Aspirin-Acetaminophen (GOODYS BODY PAIN PO) Take 1 packet by mouth daily. Every day.   Yes [provider]  atorvastatin (LIPITOR) 40 MG tablet TAKE 1 TABLET BY MOUTH EVERY DAY 10/25/20  Yes Glean Hess, MD  buPROPion (WELLBUTRIN XL) 300 MG 24 hr tablet TAKE 1 TABLET(300 MG) BY MOUTH DAILY 11/13/20  Yes Glean Hess, MD  clonazePAM (KLONOPIN) 0.25 MG  disintegrating tablet Take 1 tablet (0.25 mg total) by mouth 2 (two) times daily as needed. 06/20/20  Yes Glean Hess, MD  clotrimazole-betamethasone (LOTRISONE) cream Apply 1 application topically 2 (two) times daily. To rash on legs 09/11/20  Yes Glean Hess, MD  cyclobenzaprine (FLEXERIL) 10 MG tablet TAKE 1 TABLET BY MOUTH THREE TIMES DAILY AS NEEDED FOR MUSCLE SPASMS 03/22/20  Yes Glean Hess, MD  escitalopram (LEXAPRO) 20 MG tablet TAKE 1 TABLET BY MOUTH DAILY 06/25/20  Yes Glean Hess, MD  Esomeprazole Magnesium (NEXIUM PO) Take 20 mg by mouth daily.    Yes [provider]  Lidocaine 4 % PTCH Apply 1 patch topically daily as needed (pain).    Yes [provider]  Multiple Vitamin (MULTIVITAMIN) capsule Take 1 capsule by mouth daily.   Yes [provider]  nitroGLYCERIN (NITROSTAT) 0.4 MG SL tablet Place 1 tablet (0.4 mg total) under the tongue every 5 (five) minutes as needed for chest pain. 07/16/18 11/18/20 Yes End, Harrell Gave, MD  SUMAtriptan (IMITREX) 100 MG tablet Take 1 tablet (100 mg total) by mouth every 2 (two) hours as needed for migraine. May repeat in 2 hours if headache persists or recurs. 07/10/20  Yes Glean Hess, MD  topiramate (TOPAMAX) 25 MG tablet TAKE 1-4 TABLETS (25-100 MG TOTAL) BY MOUTH AT BEDTIME. 10/02/20  Yes Glean Hess, MD  triamcinolone (KENALOG) 0.025 % ointment Apply 1 application topically as needed. For rash 01/26/19  Yes Dunn, Dacotah Cabello M, PA-C  Ubrogepant (UBRELVY) 100 MG TABS Take 100 mg by mouth daily as needed. 05/11/20  Yes Glean Hess, MD  methylPREDNISolone (MEDROL DOSEPAK) 4 MG TBPK tablet Take according to the package insert 11/18/20   Margarette Canada, NP    Family History Family History  Problem Relation Age of Onset  . Breast cancer Mother 73  . Heart attack Father 54  . Alcohol abuse Father   . Breast cancer Maternal Grandmother   . Breast cancer Paternal Grandmother     Social  History Social History   Tobacco Use  . Smoking status: Former Smoker    Packs/day: 0.25    Years: 40.00    Pack years: 10.00    Types: Cigarettes    Quit date: 09/18/2019    Years since quitting: 1.1  . Smokeless tobacco: Never Used  Vaping Use  . Vaping Use: Never used  Substance Use Topics  . Alcohol use: Yes    Comment: less than once a week.  . Drug use: Never     Allergies   Oxycodone   Review of Systems Review of Systems  Constitutional: Negative for fever.  Musculoskeletal: Positive for arthralgias, joint swelling and myalgias.  Skin: Positive for color change. Negative for rash.  Neurological: Positive for weakness. Negative for numbness.  Psychiatric/Behavioral: Negative.      Physical Exam Triage Vital Signs ED Triage Vitals [11/18/20 0858]  Enc Vitals Group     BP      Pulse      Resp      Temp      Temp src      SpO2      Weight 198 lb (89.8 kg)     Height 5' 3"  (1.6 m)     Head Circumference      Peak Flow      Pain Score 10     Pain Loc      Pain Edu?      Excl. in [place]?    No data found.  Updated Vital Signs BP (!) 149/88 (BP Location: Left Arm)   Pulse 71   Temp 98 F (36.7 C) (Oral)   Ht 5' 3"  (1.6 m)   Wt 198 lb (89.8 kg)   SpO2 98%   BMI 35.07 kg/m   Visual Acuity Right Eye Distance:   Left Eye Distance:   Bilateral Distance:    Right Eye Near:   Left Eye Near:    Bilateral Near:     Physical Exam Vitals and nursing note reviewed.  Constitutional:      General: She is not in acute distress.    Appearance: Normal appearance. She is normal weight. She is not toxic-appearing.  HENT:     Head: Normocephalic and atraumatic.  Eyes:     General: No scleral icterus.    Extraocular Movements: Extraocular movements intact.     Conjunctiva/sclera: Conjunctivae normal.     Pupils: Pupils are equal, round, and reactive to light.  Musculoskeletal:        General: Swelling and tenderness present. No deformity or signs of  injury.  Skin:    General: Skin is warm and dry.     Capillary Refill: Capillary refill takes less than 2 seconds.     Findings: Erythema present. No bruising or rash.  Neurological:     General: No focal deficit present.     Mental Status: She is alert and oriented to person, place, and time.  Psychiatric:        Mood and Affect: Mood normal.        Behavior: Behavior normal.        Thought Content: Thought content normal.        Judgment: Judgment normal.      UC Treatments / Results  Labs (all labs ordered are listed, but only abnormal results are displayed) Labs Reviewed  CBC WITH DIFFERENTIAL/PLATELET - Abnormal; Notable for the following components:      Result Value   WBC 11.6 (*)    Platelets 402 (*)    Abs Immature Granulocytes 0.09 (*)    All other components within normal limits  SEDIMENTATION RATE - Abnormal; Notable for the following components:   Sed Rate 44 (*)    All other components within normal limits  URIC ACID    EKG   Radiology DG Wrist Complete Right  Result Date: 11/18/2020 CLINICAL DATA:  Right wrist pain with decreased range of motion. EXAM: RIGHT WRIST - COMPLETE 3+ VIEW COMPARISON:  None. FINDINGS: No acute fracture or dislocation identified. There is some heterotopic calcification/ossification adjacent to the distal pole of the scaphoid and extending towards the trapezium. This may relate to prior trauma or some type of arthropathy. No significant proliferative or erosive arthropathy is otherwise identified. No bony lesions.  No significant soft tissue swelling. IMPRESSION: Heterotopic calcification/ossification adjacent to the distal pole of the scaphoid and extending towards the trapezium. This may relate to prior trauma or some type of arthropathy. Electronically Signed   By: Aletta Edouard M.D.   On: 11/18/2020 09:58    Procedures Procedures (including critical care time)  Medications Ordered in UC Medications  ketorolac (TORADOL)  injection 60 mg (60 mg Intramuscular Given 11/18/20 0930)    Initial Impression / Assessment and Plan / UC Course  I have reviewed the triage vital signs and the nursing notes.  Pertinent labs & imaging results that were available during my care of the patient were reviewed by me and considered in my medical decision making (see chart for details).   Patient is here for evaluation of pain and tenderness to her right wrist that started suddenly yesterday.  Patient has had something similar to this 1 time in the past that they thought might be carpal tunnel was treated with a brace which helped.  The brace and lidocaine patch that she uses this time have not helped with the pain at all.  Patient has limited range of motion and decreased grip strength in the right hand due to the swelling.  Good cap refill.  Patient has tenderness over the carpal bones as well as the radial ulnar styloid.  The area is warm, mildly erythematous and exquisitely tender to palpation.  Patient has not taken anything for pain here.  She does not have a history of gout but she is on aspirin therapy and also uses Goody powders with aspirin which can increase uric acid levels.  Will check uric acid, CBC, and ESR to look for inflammation and infection given swelling tenderness and redness.  Will also obtain right wrist film to look for bony erosions that may be consistent with gout or any other bony abnormality.  Right wrist film independently interpreted by me.  There is no evidence of fracture or dislocation.  No erosion of the bony surfaces of the carpals or articular surfaces of radius or ulna.  There is an opacity on the lateral volar aspect tween the scaphoid and this is an area where the patient complains of exquisite tenderness.  Will await radiology overread.  Radiology over read of right wrist film shows no acute findings but they agree with the calcification that they are turning and arthropathy.  Acid is 5.2.  CBC  shows a mildly elevated white count of 11.6 with a normal differential.  Sedimentation rate is 44.  Based on elevated WBC count and elevated ESR will treat for gouty arthropathy in the right wrist with Medrol Dosepak.  Despite uric acid level being normal which may be a reflection of the inflammatory process going on.  I will encourage patient to have her blood redrawn 4 to 6 weeks after she completes treatment to see if her uric acid level is elevated.    Final Clinical Impressions(s) / UC Diagnoses   Final diagnoses:  Acute gout of right wrist, unspecified cause     Discharge Instructions     Take the Medrol Dosepak according to the package insert.  Increase your oral fluid intake to help improve circulation and remove the metabolic byproducts of inflammation.  Keep your right arm elevated as much as possible.  If your symptoms continue follow-up with your primary care provider.  You may want to consider follow-up with your PCP in 4 to 6 weeks to have a repeat uric acid level  drawn to see what you are at baseline.    ED Prescriptions    Medication Sig Dispense Auth. Provider   methylPREDNISolone (MEDROL DOSEPAK) 4 MG TBPK tablet Take according to the package insert 1 each Margarette Canada, NP     I have reviewed the PDMP during this encounter.   Margarette Canada, NP 11/18/20 1030

## 2020-11-18 NOTE — Discharge Instructions (Addendum)
Take the Medrol Dosepak according to the package insert.  Increase your oral fluid intake to help improve circulation and remove the metabolic byproducts of inflammation.  Keep your right arm elevated as much as possible.  If your symptoms continue follow-up with your primary care provider.  You may want to consider follow-up with your PCP in 4 to 6 weeks to have a repeat uric acid level drawn to see what you are at baseline.

## 2020-11-20 ENCOUNTER — Telehealth: Payer: Self-pay

## 2020-11-20 NOTE — Telephone Encounter (Signed)
Patient called and requested advise. Said her right hand is swollen, warm, and painful. She seen UC on Saturday and they said they believe she has gout. She was able to send Pictures and Dr. Judithann Graves reviewed them - Told patient that Dr. Judithann Graves does not believe she has gout or infection. She needs to finish the prednisone and keep her hand elevated as much as possible. Let us know how her hand is when she finishes the prednisone taper.

## 2020-11-22 ENCOUNTER — Encounter: Payer: 59 | Admitting: Internal Medicine

## 2020-11-30 ENCOUNTER — Other Ambulatory Visit: Payer: Self-pay | Admitting: Internal Medicine

## 2020-11-30 DIAGNOSIS — B354 Tinea corporis: Secondary | ICD-10-CM

## 2020-11-30 DIAGNOSIS — E782 Mixed hyperlipidemia: Secondary | ICD-10-CM

## 2020-11-30 DIAGNOSIS — F321 Major depressive disorder, single episode, moderate: Secondary | ICD-10-CM

## 2020-11-30 NOTE — Telephone Encounter (Signed)
Requested medication (s) are due for refill today:   No.  The Lipitor and Lexapro are being requested too soon.  The Lotrisone cream does not have a protocol assigned to it.  Requested medication (s) are on the active medication list:   Yes  Future visit scheduled:   Yes in 1 mo. With Dr. Judithann Graves   Last ordered: Lipitor 10/25/2020 #90, 0 refills;  Lexapro 06/25/2020 #90, 1 refill  Returned  because Lotrisone cream does not have a protocol assigned to it.   Requested Prescriptions  Pending Prescriptions Disp Refills   atorvastatin (LIPITOR) 40 MG tablet [Pharmacy Med Name: ATORVASTATIN 40 MG TABLET] 90 tablet 0    Sig: TAKE 1 TABLET BY MOUTH EVERY DAY      Cardiovascular:  Antilipid - Statins Failed - 11/30/2020  1:34 PM      Failed - Total Cholesterol in normal range and within 360 days    Cholesterol, Total  Date Value Ref Range Status  09/02/2019 130 100 - 199 mg/dL Final          Failed - LDL in normal range and within 360 days    LDL Chol Calc (NIH)  Date Value Ref Range Status  09/02/2019 50 0 - 99 mg/dL Final   LDL Direct  Date Value Ref Range Status  09/02/2019 48 0 - 99 mg/dL Final          Failed - HDL in normal range and within 360 days    HDL  Date Value Ref Range Status  09/02/2019 33 (L) >39 mg/dL Final          Failed - Triglycerides in normal range and within 360 days    Triglycerides  Date Value Ref Range Status  09/02/2019 307 (H) 0 - 149 mg/dL Final          Passed - Patient is not pregnant      Passed - Valid encounter within last 12 months    Recent Outpatient Visits           2 months ago Migraine without aura and without status migrainosus, not intractable   [facility] Reubin Milan, MD   4 months ago Carpal tunnel syndrome of right wrist   [facility] Reubin Milan, MD   6 months ago Migraine without aura and without status migrainosus, not intractable   [facility] Reubin Milan, MD   9  months ago Current moderate episode of major depressive disorder without prior episode [facility])   [facility] Reubin Milan, MD   1 year ago Annual physical exam   [facility] Reubin Milan, MD       Future Appointments             In 1 month Judithann Graves Nyoka Cowden, MD [facility]               clotrimazole-betamethasone (LOTRISONE) cream [Pharmacy Med Name: CLOTRIMAZOLE-BETAMETHASONE CRM] 30 g 0    Sig: APPLY 1 APPLICATION TOPICALLY 2 (TWO) TIMES DAILY. TO RASH ON LEGS      Off-Protocol Failed - 11/30/2020  1:34 PM      Failed - Medication not assigned to a protocol, review manually.      Passed - Valid encounter within last 12 months    Recent Outpatient Visits           2 months ago Migraine without aura and without status migrainosus, not intractable  [facility] Reubin Milan, MD   4 months ago Carpal tunnel syndrome of right wrist   [facility] Reubin Milan, MD   6 months ago Migraine without aura and without status migrainosus, not intractable   [facility] Reubin Milan, MD   9 months ago Current moderate episode of major depressive disorder without prior episode [facility])   [facility] Reubin Milan, MD   1 year ago Annual physical exam   [facility] Reubin Milan, MD       Future Appointments             In 1 month Reubin Milan, MD [facility]               escitalopram (LEXAPRO) 20 MG tablet [Pharmacy Med Name: ESCITALOPRAM 20 MG TABLET] 90 tablet 1    Sig: TAKE 1 TABLET BY MOUTH DAILY      Psychiatry:  Antidepressants - SSRI Passed - 11/30/2020  1:37 PM      Passed - Completed PHQ-2 or PHQ-9 in the last 360 days      Passed - Valid encounter within last 6 months    Recent Outpatient Visits           2 months ago Migraine without aura and without status migrainosus, not intractable   [facility] Reubin Milan, MD   4 months ago Carpal tunnel syndrome of right wrist   [facility] Reubin Milan, MD   6 months ago Migraine without aura and without status migrainosus, not intractable   [facility] Reubin Milan, MD   9 months ago Current moderate episode of major depressive disorder without prior episode [facility])   [facility] Reubin Milan, MD   1 year ago Annual physical exam   [facility] Reubin Milan, MD       Future Appointments             In 1 month Judithann Graves, Nyoka Cowden, MD [facility]

## 2021-01-22 ENCOUNTER — Other Ambulatory Visit: Payer: Self-pay

## 2021-01-22 ENCOUNTER — Encounter: Payer: Self-pay | Admitting: Internal Medicine

## 2021-01-22 ENCOUNTER — Ambulatory Visit (INDEPENDENT_AMBULATORY_CARE_PROVIDER_SITE_OTHER): Payer: 59 | Admitting: Internal Medicine

## 2021-01-22 VITALS — BP 144/96 | HR 96 | Temp 98.3°F | Ht 63.0 in | Wt 212.0 lb

## 2021-01-22 DIAGNOSIS — G43009 Migraine without aura, not intractable, without status migrainosus: Secondary | ICD-10-CM

## 2021-01-22 DIAGNOSIS — I25118 Atherosclerotic heart disease of native coronary artery with other forms of angina pectoris: Secondary | ICD-10-CM | POA: Diagnosis not present

## 2021-01-22 DIAGNOSIS — Z1231 Encounter for screening mammogram for malignant neoplasm of breast: Secondary | ICD-10-CM

## 2021-01-22 DIAGNOSIS — I10 Essential (primary) hypertension: Secondary | ICD-10-CM

## 2021-01-22 DIAGNOSIS — F324 Major depressive disorder, single episode, in partial remission: Secondary | ICD-10-CM | POA: Diagnosis not present

## 2021-01-22 MED ORDER — HYDROCHLOROTHIAZIDE 25 MG PO TABS: 25.0000 mg | ORAL_TABLET | Freq: Every day | ORAL | 3 refills | Status: DC

## 2021-01-22 NOTE — Progress Notes (Signed)
Date:  01/22/2021   Name:  Nancy Skinner   DOB:  December 30, 1965   MRN:  [id number]   Chief Complaint: Migraine and Depression  Migraine  This is a recurrent problem. The problem occurs monthly. The pain is located in the bilateral region. The pain does not radiate. The pain quality is similar to prior headaches. Pertinent negatives include no coughing or fever. She has tried triptans (topamax and Ubrelvy-prn) for the symptoms. Her past medical history is significant for hypertension.  Depression        This is a chronic problem.  The problem has been resolved since onset.  Associated symptoms include headaches.  Associated symptoms include no fatigue.  Past treatments include SSRIs - Selective serotonin reuptake inhibitors and other medications (clonazepam and bupropion).  Compliance with treatment is good. Hypertension This is a new problem. The problem is uncontrolled. Associated symptoms include headaches. Pertinent negatives include no chest pain, palpitations or shortness of breath. Past treatments include beta blockers (made HA worse). There are no compliance problems.     Lab Results  Component Value Date   CREATININE 0.78 09/11/2020   BUN 12 09/11/2020   NA 138 09/11/2020   K 3.4 (L) 09/11/2020   CL 102 09/11/2020   CO2 20 09/11/2020   Lab Results  Component Value Date   CHOL 130 09/02/2019   HDL 33 (L) 09/02/2019   LDLCALC 50 09/02/2019   LDLDIRECT 48 09/02/2019   TRIG 307 (H) 09/02/2019   CHOLHDL 3.9 09/02/2019   Lab Results  Component Value Date   TSH 4.070 09/11/2020   Lab Results  Component Value Date   HGBA1C 5.9 06/26/2018   Lab Results  Component Value Date   WBC 11.6 (H) 11/18/2020   HGB 13.5 11/18/2020   HCT 41.7 11/18/2020   MCV 86.0 11/18/2020   PLT 402 (H) 11/18/2020   Lab Results  Component Value Date   ALT 12 09/11/2020   AST 12 09/11/2020   ALKPHOS 83 09/11/2020   BILITOT <0.2 09/11/2020     Review of Systems  Constitutional: Negative  for chills, fatigue and fever.  Respiratory: Negative for cough, chest tightness, shortness of breath and wheezing.   Cardiovascular: Positive for leg swelling. Negative for chest pain and palpitations.  Genitourinary: Negative for dysuria and urgency.  Musculoskeletal: Negative for arthralgias.  Skin: Negative for color change and rash.  Neurological: Positive for headaches.  Psychiatric/Behavioral: Positive for depression. Negative for dysphoric mood and sleep disturbance. The patient is not nervous/anxious.     Patient Active Problem List   Diagnosis Date Noted  . Carpal tunnel syndrome of right wrist 07/10/2020  . Screening for colon cancer   . Coronary artery disease of native artery of native heart with stable angina pectoris (HCC) 01/18/2019  . Major depression single episode, in partial remission (HCC) 01/18/2019  . Neutrophilic leukocytosis 08/09/2018  . Pulmonary nodule less than 6 cm determined by computed tomography of lung 07/30/2018  . Night sweats 07/16/2018  . Neck muscle spasm 07/15/2018  . Mixed hyperlipidemia 07/15/2018  . Tobacco use disorder, mild, in early remission 07/15/2018  . Migraine without aura and without status migrainosus, not intractable 06/24/2018  . Gastroesophageal reflux disease without esophagitis 06/24/2018  . Chronic midline low back pain without sciatica 06/24/2018    Allergies  Allergen Reactions  . Oxycodone Other (See Comments)    "Black outs" and confusion    Past Surgical History:  Procedure Laterality Date  . ABDOMINAL HYSTERECTOMY  2012   partial for bleeding  . BACK SURGERY    . BREAST BIOPSY Right    neg  . BREAST BIOPSY Left 2015   4 bxs- neg  . BREAST SURGERY Left    multiple biopsies  . CARDIAC CATHETERIZATION    . CATARACT EXTRACTION Right 12/19/2010  . CHOLECYSTECTOMY  1999  . COLONOSCOPY WITH PROPOFOL N/A 03/31/2020   Procedure: COLONOSCOPY WITH PROPOFOL;  Surgeon: Midge Minium, MD;  Location: [facility] ENDOSCOPY;   Service: Endoscopy;  Laterality: N/A;  . EYE SURGERY    . INTRAVASCULAR PRESSURE WIRE/FFR STUDY N/A 07/22/2018   Procedure: INTRAVASCULAR PRESSURE WIRE/FFR STUDY;  Surgeon: Yvonne Kendall, MD;  Location: [facility] INVASIVE CV LAB;  Service: Cardiovascular;  Laterality: N/A;  . LEFT HEART CATH AND CORONARY ANGIOGRAPHY N/A 07/22/2018   Procedure: LEFT HEART CATH AND CORONARY ANGIOGRAPHY;  Surgeon: Yvonne Kendall, MD;  Location: [facility] INVASIVE CV LAB;  Service: Cardiovascular;  Laterality: N/A;  . POSTERIOR LUMBAR FUSION 4 LEVEL  2000    Social History   Tobacco Use  . Smoking status: Former Smoker    Packs/day: 0.25    Years: 40.00    Pack years: 10.00    Types: Cigarettes    Quit date: 09/18/2019    Years since quitting: 1.3  . Smokeless tobacco: Never Used  Vaping Use  . Vaping Use: Never used  Substance Use Topics  . Alcohol use: Yes    Comment: less than once a week.  . Drug use: Never     Medication list has been reviewed and updated.  Current Meds  Medication Sig  . aspirin EC 81 MG tablet Take 81 mg by mouth daily.  . Aspirin-Acetaminophen (GOODYS BODY PAIN PO) Take 1 packet by mouth daily. Every day.  Marland Kitchen atorvastatin (LIPITOR) 40 MG tablet TAKE 1 TABLET BY MOUTH EVERY DAY  . buPROPion (WELLBUTRIN XL) 300 MG 24 hr tablet TAKE 1 TABLET(300 MG) BY MOUTH DAILY  . clonazePAM (KLONOPIN) 0.25 MG disintegrating tablet Take 1 tablet (0.25 mg total) by mouth 2 (two) times daily as needed.  . clotrimazole-betamethasone (LOTRISONE) cream APPLY 1 APPLICATION TOPICALLY 2 (TWO) TIMES DAILY. TO RASH ON LEGS  . cyclobenzaprine (FLEXERIL) 10 MG tablet TAKE 1 TABLET BY MOUTH 3 TIMES DAILY AS NEEDED FOR MUSCLE SPASMS  . escitalopram (LEXAPRO) 20 MG tablet TAKE 1 TABLET BY MOUTH DAILY  . Esomeprazole Magnesium (NEXIUM PO) Take 20 mg by mouth daily.   . Lidocaine 4 % PTCH Apply 1 patch topically daily as needed (pain).   . methylPREDNISolone (MEDROL DOSEPAK) 4 MG TBPK tablet Take according to the  package insert  . Multiple Vitamin (MULTIVITAMIN) capsule Take 1 capsule by mouth daily.  . SUMAtriptan (IMITREX) 100 MG tablet Take 1 tablet (100 mg total) by mouth every 2 (two) hours as needed for migraine. May repeat in 2 hours if headache persists or recurs.  . topiramate (TOPAMAX) 25 MG tablet TAKE 1-4 TABLETS (25-100 MG TOTAL) BY MOUTH AT BEDTIME.  Marland Kitchen triamcinolone (KENALOG) 0.025 % ointment Apply 1 application topically as needed. For rash  . Ubrogepant (UBRELVY) 100 MG TABS Take 100 mg by mouth daily as needed.    PHQ 2/9 Scores 01/22/2021 09/11/2020 07/10/2020 05/11/2020  PHQ - 2 Score 0 4 4 6   PHQ- 9 Score 0 7 16 16     GAD 7 : Generalized Anxiety Score 01/22/2021 09/11/2020 07/10/2020 05/11/2020  Nervous, Anxious, on Edge 0 3 3 2   Control/stop worrying 0 3 3 3   Worry  too much - different things 0 3 3 3   Trouble relaxing 0 1 2 3   Restless 0 1 0 3  Easily annoyed or irritable 0 3 3 3   Afraid - awful might happen 0 3 3 3   Total GAD 7 Score 0 17 17 20   Anxiety Difficulty - Somewhat difficult Not difficult at all Somewhat difficult    BP Readings from Last 3 Encounters:  01/22/21 (!) 146/86  11/18/20 (!) 149/88  09/11/20 138/86    Physical Exam Vitals and nursing note reviewed.  Constitutional:      General: She is not in acute distress.    Appearance: Normal appearance. She is well-developed.  HENT:     Head: Normocephalic and atraumatic.  Cardiovascular:     Rate and Rhythm: Normal rate and regular rhythm.     Pulses: Normal pulses.     Heart sounds: No murmur heard.   Pulmonary:     Effort: Pulmonary effort is normal. No respiratory distress.     Breath sounds: No wheezing or rhonchi.  Musculoskeletal:     Cervical back: Normal range of motion and neck supple.     Right lower leg: Edema present.     Left lower leg: Edema present.  Skin:    General: Skin is warm and dry.     Findings: No rash.  Neurological:     General: No focal deficit present.     Mental  Status: She is alert and oriented to person, place, and time.  Psychiatric:        Mood and Affect: Mood and affect normal.        Behavior: Behavior normal.        Thought Content: Thought content normal.     Wt Readings from Last 3 Encounters:  01/22/21 212 lb (96.2 kg)  11/18/20 198 lb (89.8 kg)  09/11/20 198 lb (89.8 kg)    BP (!) 146/86   Pulse 96   Temp 98.3 F (36.8 C) (Oral)   Ht 5\' 3"  (1.6 m)   Wt 212 lb (96.2 kg)   SpO2 96%   BMI 37.55 kg/m   Assessment and Plan: 1. Migraine without aura and without status migrainosus, not intractable Migraine well controlled on current medications  2. Major depression single episode, in partial remission (HCC) Clinically stable on current regimen with good control of symptoms, No SI or HI. Will continue current therapy.  3. Primary hypertension Not currently on any treatment - stopped metoprolol due to HA Will start HCTZ - hydrochlorothiazide (HYDRODIURIL) 25 MG tablet; Take 1 tablet (25 mg total) by mouth daily.  Dispense: 90 tablet; Refill: 3  4. Coronary artery disease of native artery of native heart with stable angina pectoris (HCC) Followed by cardiology  5. Encounter for screening mammogram for breast cancer - MM 3D SCREEN BREAST BILATERAL; Future   Partially dictated using 14/04/21. Any errors are unintentional.  09/13/20, MD [facility]  01/22/2021

## 2021-01-25 ENCOUNTER — Ambulatory Visit (INDEPENDENT_AMBULATORY_CARE_PROVIDER_SITE_OTHER): Payer: 59 | Admitting: Family

## 2021-01-25 ENCOUNTER — Other Ambulatory Visit: Payer: Self-pay

## 2021-01-25 ENCOUNTER — Encounter: Payer: Self-pay | Admitting: Family

## 2021-01-25 VITALS — BP 120/72 | HR 97 | Ht 63.0 in | Wt 210.0 lb

## 2021-01-25 DIAGNOSIS — I25118 Atherosclerotic heart disease of native coronary artery with other forms of angina pectoris: Secondary | ICD-10-CM

## 2021-01-25 DIAGNOSIS — I10 Essential (primary) hypertension: Secondary | ICD-10-CM

## 2021-01-25 DIAGNOSIS — E782 Mixed hyperlipidemia: Secondary | ICD-10-CM

## 2021-01-25 DIAGNOSIS — R072 Precordial pain: Secondary | ICD-10-CM

## 2021-01-25 MED ORDER — METOPROLOL TARTRATE 100 MG PO TABS: 100.0000 mg | ORAL_TABLET | Freq: Once | ORAL | 0 refills | Status: DC

## 2021-01-25 NOTE — Patient Instructions (Addendum)
Medication Instructions:  No medication changes today.   *If you need a refill on your cardiac medications before your next appointment, please call your pharmacy*  Lab Work:  Your physician recommends that you return for lab work 3-5 days prior to your cardiac CTA to the [facility]. We will collect a CMP as well as lipid panel. Please be fasting for this blood work.  -  Please go to the [facility]. You will check in at the front desk to the right as you walk into the atrium. Valet Parking is offered if needed. - No appointment needed. You may go any day between 7 am and 6 pm.   Testing/Procedures: Your physician has requested that you have cardiac CT. Cardiac computed tomography (CT) is a painless test that uses an x-ray machine to take clear, detailed pictures of your heart.Please follow instruction sheet as given.  Your cardiac CT will be scheduled at one of the below locations:   [facility] [address] [place], [place] [address] [phone]   [facility], please arrive 15 mins early for check-in and test prep.  Please follow these instructions carefully (unless otherwise directed):    On the Night Before the Test: . Be sure to Drink plenty of water. . Do not consume any caffeinated/decaffeinated beverages or chocolate 12 hours prior to your test. . Do not take any antihistamines 12 hours prior to your test.   On the Day of the Test: . Drink plenty of water until 1 hour prior to the test. . Do not eat any food 4 hours prior to the test. . You may take your regular medications prior to the test.  . Take metoprolol (Lopressor) two hours prior to test. . HOLD Hydrochlorothiazide morning of the test. . FEMALES- please wear underwire-free bra if available        After the Test: . Drink plenty of water. . After receiving IV contrast, you may experience a mild flushed feeling. This  is normal. . On occasion, you may experience a mild rash up to 24 hours after the test. This is not dangerous. If this occurs, you can take Benadryl 25 mg and increase your fluid intake. . If you experience trouble breathing, this can be serious. If it is severe call 911 IMMEDIATELY. If it is mild, please call our office. . If you take any of these medications: Glipizide/Metformin, Avandament, Glucavance, please do not take 48 hours after completing test unless otherwise instructed.   Once we have confirmed authorization from your insurance company, we will call you to set up a date and time for your test. Based on how quickly your insurance processes prior authorizations requests, please allow up to 4 weeks to be contacted for scheduling your Cardiac CT appointment. Be advised that routine Cardiac CT appointments could be scheduled as many as 8 weeks after your provider has ordered it.  For non-scheduling related questions, please contact the cardiac imaging nurse navigator should you have any questions/concerns: Rockwell Alexandria, Cardiac Imaging Nurse Navigator Larey Brick, Cardiac Imaging Nurse Navigator [facility] Direct Office Dial: [phone]   For scheduling needs, including cancellations and rescheduling, please call [place], [phone].    Follow-Up: At [facility], you and your health needs are our priority.  As part of our continuing mission to provide you with exceptional heart care, we have created designated Provider Care Teams.  These Care Teams include your  primary Cardiologist (physician) and Advanced Practice Providers (APPs -  Physician Assistants and Nurse Practitioners) who all work together to provide you with the care you need, when you need it.  We recommend signing up for the patient portal called "MyChart".  Sign up information is provided on this After Visit Summary.  MyChart is used to connect with patients for Virtual Visits  (Telemedicine).  Patients are able to view lab/test results, encounter notes, upcoming appointments, etc.  Non-urgent messages can be sent to your provider as well.   To learn more about what you can do with MyChart, go to [email or web address].    Your next appointment:   1 year(s)  The format for your next appointment:   In Person  Provider:   You may see Yvonne Kendall, MD or one of the following Advanced Practice Providers on your designated Care Team:    Nicolasa Ducking, NP  Eula Listen, PA-C  Marisue Ivan, PA-C  Cadence Delco, [place]  Gillian Shields, NP  Other Instructions

## 2021-01-25 NOTE — Progress Notes (Signed)
Office Visit    Patient Name: Nancy Skinner Date of Encounter: 01/25/2021  Primary Care Provider:  Reubin Milan, MD Primary Cardiologist:  Yvonne Kendall, MD Electrophysiologist:  None   Chief Complaint    Nancy Skinner is a 55 y.o. female with a hx of nonobstructive coronary disease, hyperlipidemia, migraine disorder, depression, hypertension presents today for follow up of CAD.   Past Medical History    Past Medical History:  Diagnosis Date  . Coronary artery disease, non-occlusive    a. LHC 07/22/18: LM nl, mLAD 60% w/ FFR 0.83, LCx nl, RCA nl, EF 55-65%, nl LVEDP, no AS.  . Depression   . Lumbar disc disease   . Migraines   . Mixed hyperlipidemia    Past Surgical History:  Procedure Laterality Date  . ABDOMINAL HYSTERECTOMY  2012   partial for bleeding  . BACK SURGERY    . BREAST BIOPSY Right    neg  . BREAST BIOPSY Left 2015   4 bxs- neg  . BREAST SURGERY Left    multiple biopsies  . CARDIAC CATHETERIZATION    . CATARACT EXTRACTION Right 12/19/2010  . CHOLECYSTECTOMY  1999  . COLONOSCOPY WITH PROPOFOL N/A 03/31/2020   Procedure: COLONOSCOPY WITH PROPOFOL;  Surgeon: Midge Minium, MD;  Location: [facility] ENDOSCOPY;  Service: Endoscopy;  Laterality: N/A;  . EYE SURGERY    . INTRAVASCULAR PRESSURE WIRE/FFR STUDY N/A 07/22/2018   Procedure: INTRAVASCULAR PRESSURE WIRE/FFR STUDY;  Surgeon: Yvonne Kendall, MD;  Location: [facility] INVASIVE CV LAB;  Service: Cardiovascular;  Laterality: N/A;  . LEFT HEART CATH AND CORONARY ANGIOGRAPHY N/A 07/22/2018   Procedure: LEFT HEART CATH AND CORONARY ANGIOGRAPHY;  Surgeon: Yvonne Kendall, MD;  Location: [facility] INVASIVE CV LAB;  Service: Cardiovascular;  Laterality: N/A;  . POSTERIOR LUMBAR FUSION 4 LEVEL  2000    Allergies  Allergies  Allergen Reactions  . Oxycodone Other (See Comments)    "Black outs" and confusion    History of Present Illness    Nancy Skinner is a 55 y.o. female with a hx of nonobstructive coronary  disease, hyperlipidemia, migraine disorder, hypertension,  depression last seen 09/02/19 by Dr. Okey Dupre.  She was admitted to the hospital 07/2018 with intermittent chest pain. Diagnostic cardiac catheterization with moderate nonobstructive LAD disease estimated at 60% with FFR 0.82, otherwise normal LCx and RCA. LVEF 55-65% with normal LVEDP and no aortic stenosis. She was recommended for medical therapy. Previous intolerance to Imdur with headache. She did stop Atorvastatin due to rash but she resumed and rash did not recur. Her Metoprolol has been previously de-escalated to fatigue with improvement in symptoms.   She was last seen 08/2019 and doing well from a cardiac perspective. No changes were made at that time.   Since last seen she was started on HCTZ 25mg  daily for hypertension by her primary care provider.   Presents today for follow-up.  Tells me she was for the hydrochlorothiazide would exponentially increase her urinary frequency and has not.  Reports improvement in blood pressure.  Does note occasional lower extremity edema that is worse at the end of the day and resolves with elevation.  Long discussion regarding dependent edema and venous insufficiency.  Works in for a trucking company.  Reports this is a very stressful job.  Has an upcoming trip to take her grandkids to [place] in the spring.  She and her husband recently got custody of their 78-year-old grandson.  Tells me every once in awhile she gets "something in  her chest".  Does have pain in her left arm that she associates with the numbness and radiates to the left chest.  It occurs with exertion and at rest.  Not associate with shortness of breath or diaphoresis.  She tells me with gaining custody of her third 40-year-old grandson she wants to ensure her heart is in good shape.  EKGs/Labs/Other Studies Reviewed:   The following studies were reviewed today:  EKG:  EKG is ordered today.  The ekg ordered today demonstrates  NSR 97 bpm with no acute ST/T wave changes.   Recent Labs: 09/11/2020: ALT 12; BUN 12; Creatinine, Ser 0.78; Potassium 3.4; Sodium 138; TSH 4.070 11/18/2020: Hemoglobin 13.5; Platelets 402  Recent Lipid Panel    Component Value Date/Time   CHOL 130 09/02/2019 0902   TRIG 307 (H) 09/02/2019 0902   HDL 33 (L) 09/02/2019 0902   CHOLHDL 3.9 09/02/2019 0902   LDLCALC 50 09/02/2019 0902   LDLDIRECT 48 09/02/2019 0902   Home Medications   No outpatient medications have been marked as taking for the 01/25/21 encounter (Appointment) with Alver Sorrow, NP.     Review of Systems      All other systems reviewed and are otherwise negative except as noted above.  Physical Exam    VS:  There were no vitals taken for this visit. , BMI There is no height or weight on file to calculate BMI.  Wt Readings from Last 3 Encounters:  01/22/21 212 lb (96.2 kg)  11/18/20 198 lb (89.8 kg)  09/11/20 198 lb (89.8 kg)    GEN: Well nourished, overweight, well developed, in no acute distress. HEENT: normal. Neck: Supple, no JVD, carotid bruits, or masses. Cardiac: RRR, no murmurs, rubs, or gallops. No clubbing, cyanosis, edema.  Radials/DP/PT 2+ and equal bilaterally.  Respiratory:  Respirations regular and unlabored, clear to auscultation bilaterally. GI: Soft, nontender, nondistended. MS: No deformity or atrophy. Skin: Warm and dry, no rash. Neuro:  Strength and sensation are intact. Psych: Normal affect.  Assessment & Plan    1. Nonobstructive coronary artery disease - Reports occasional left-sided arm and chest discomfort that occurs at rest and with activity.  EKG today shows normal sinus rhythm with no acute ST/T wave changes.  Her pain is overall atypical for angina though we did discuss that anginal symptoms not to be atypical in women.  As such, plan for cardiac CTA to rule out worsening coronary artery disease.  2. Hyperlipidemia -LDL goal less than 70 in the setting of coronary artery  disease.  Continue atorvastatin 40 mg daily.  She is overdue for lipid monitoring labs though not fasting today. She will return to the [facility] for fasting lipid panel, CMP.  Disposition: Follow up in 1 year(s) with Dr. Okey Dupre or APP. If cardiac CTA shows worsening coronary disease, sooner follow-up will be scheduled  Signed, Alver Sorrow, NP 01/25/2021, 1:20 PM [facility]

## 2021-01-26 ENCOUNTER — Other Ambulatory Visit: Payer: Self-pay | Admitting: Internal Medicine

## 2021-01-26 DIAGNOSIS — G43009 Migraine without aura, not intractable, without status migrainosus: Secondary | ICD-10-CM

## 2021-01-28 ENCOUNTER — Other Ambulatory Visit: Payer: Self-pay | Admitting: Internal Medicine

## 2021-01-28 DIAGNOSIS — E782 Mixed hyperlipidemia: Secondary | ICD-10-CM

## 2021-01-28 DIAGNOSIS — F418 Other specified anxiety disorders: Secondary | ICD-10-CM

## 2021-01-28 DIAGNOSIS — M545 Low back pain, unspecified: Secondary | ICD-10-CM

## 2021-01-28 DIAGNOSIS — F321 Major depressive disorder, single episode, moderate: Secondary | ICD-10-CM

## 2021-01-28 DIAGNOSIS — G8929 Other chronic pain: Secondary | ICD-10-CM

## 2021-01-28 DIAGNOSIS — B354 Tinea corporis: Secondary | ICD-10-CM

## 2021-01-28 NOTE — Telephone Encounter (Signed)
Requested medication (s) are due for refill today: yes  Requested medication (s) are on the active medication list: yes  Last refill: 12/26/2020  Future visit scheduled:yes  Notes to clinic: this refill cannot be delegated    Requested Prescriptions  Pending Prescriptions Disp Refills   cyclobenzaprine (FLEXERIL) 10 MG tablet [Pharmacy Med Name: CYCLOBENZAPRINE 10 MG TABLET] 90 tablet 1    Sig: TAKE 1 TABLET BY MOUTH THREE TIMES A DAY AS NEEDED FOR MUSCLE SPASMS      Not Delegated - Analgesics:  Muscle Relaxants Failed - 01/28/2021  5:22 PM      Failed - This refill cannot be delegated      Passed - Valid encounter within last 6 months    Recent Outpatient Visits           6 days ago Migraine without aura and without status migrainosus, not intractable   [facility] Reubin Milan, MD   4 months ago Migraine without aura and without status migrainosus, not intractable   [facility] Reubin Milan, MD   6 months ago Carpal tunnel syndrome of right wrist   [facility] Reubin Milan, MD   8 months ago Migraine without aura and without status migrainosus, not intractable   [facility] Reubin Milan, MD   11 months ago Current moderate episode of major depressive disorder without prior episode [facility])   [facility] Reubin Milan, MD       Future Appointments             In 2 months Judithann Graves Nyoka Cowden, MD [facility]               clotrimazole-betamethasone (LOTRISONE) cream [Pharmacy Med Name: CLOTRIMAZOLE-BETAMETHASONE CRM] 30 g 0    Sig: APPLY 1 APPLICATION TOPICALLY 2 (TWO) TIMES DAILY. TO RASH ON LEGS      Off-Protocol Failed - 01/28/2021  5:22 PM      Failed - Medication not assigned to a protocol, review manually.      Passed - Valid encounter within last 12 months    Recent Outpatient Visits           6 days ago Migraine without aura and without status migrainosus, not intractable    [facility] Reubin Milan, MD   4 months ago Migraine without aura and without status migrainosus, not intractable   [facility] Reubin Milan, MD   6 months ago Carpal tunnel syndrome of right wrist   [facility] Reubin Milan, MD   8 months ago Migraine without aura and without status migrainosus, not intractable   [facility] Reubin Milan, MD   11 months ago Current moderate episode of major depressive disorder without prior episode [facility])   [facility] Reubin Milan, MD       Future Appointments             In 2 months Judithann Graves Nyoka Cowden, MD [facility]               clonazePAM (KLONOPIN) 0.25 MG disintegrating tablet [Pharmacy Med Name: CLONAZEPAM 0.25 MG ODT] 60 tablet     Sig: TAKE 1 TABLET BY MOUTH 2 TIMES DAILY AS NEEDED.      Not Delegated - Psychiatry:  Anxiolytics/Hypnotics Failed - 01/28/2021  5:22 PM      Failed - This refill cannot be delegated  Failed - Urine Drug Screen completed in last 360 days      Passed - Valid encounter within last 6 months    Recent Outpatient Visits           6 days ago Migraine without aura and without status migrainosus, not intractable   [facility] Reubin Milan, MD   4 months ago Migraine without aura and without status migrainosus, not intractable   [facility] Reubin Milan, MD   6 months ago Carpal tunnel syndrome of right wrist   [facility] Reubin Milan, MD   8 months ago Migraine without aura and without status migrainosus, not intractable   [facility] Reubin Milan, MD   11 months ago Current moderate episode of major depressive disorder without prior episode [facility])   [facility] Reubin Milan, MD       Future Appointments             In 2 months Reubin Milan, MD [facility]              Signed Prescriptions Disp Refills    escitalopram (LEXAPRO) 20 MG tablet 90 tablet 1    Sig: TAKE 1 TABLET BY MOUTH DAILY      Psychiatry:  Antidepressants - SSRI Passed - 01/28/2021  5:22 PM      Passed - Completed PHQ-2 or PHQ-9 in the last 360 days      Passed - Valid encounter within last 6 months    Recent Outpatient Visits           6 days ago Migraine without aura and without status migrainosus, not intractable   [facility] Reubin Milan, MD   4 months ago Migraine without aura and without status migrainosus, not intractable   [facility] Reubin Milan, MD   6 months ago Carpal tunnel syndrome of right wrist   [facility] Reubin Milan, MD   8 months ago Migraine without aura and without status migrainosus, not intractable   [facility] Reubin Milan, MD   11 months ago Current moderate episode of major depressive disorder without prior episode [facility])   [facility] Reubin Milan, MD       Future Appointments             In 2 months Reubin Milan, MD [facility]               atorvastatin (LIPITOR) 40 MG tablet 90 tablet 0    Sig: TAKE 1 TABLET BY MOUTH EVERY DAY      Cardiovascular:  Antilipid - Statins Failed - 01/28/2021  5:22 PM      Failed - Total Cholesterol in normal range and within 360 days    Cholesterol, Total  Date Value Ref Range Status  09/02/2019 130 100 - 199 mg/dL Final          Failed - LDL in normal range and within 360 days    LDL Chol Calc (NIH)  Date Value Ref Range Status  09/02/2019 50 0 - 99 mg/dL Final   LDL Direct  Date Value Ref Range Status  09/02/2019 48 0 - 99 mg/dL Final          Failed - HDL in normal range and within 360 days    HDL  Date Value Ref Range Status  09/02/2019 33 (L) >39  mg/dL Final          Failed - Triglycerides in normal range and within 360 days    Triglycerides  Date Value Ref Range Status  09/02/2019 307 (H) 0 - 149 mg/dL Final           Passed - Patient is not pregnant      Passed - Valid encounter within last 12 months    Recent Outpatient Visits           6 days ago Migraine without aura and without status migrainosus, not intractable   [facility] Reubin Milan, MD   4 months ago Migraine without aura and without status migrainosus, not intractable   [facility] Reubin Milan, MD   6 months ago Carpal tunnel syndrome of right wrist   [facility] Reubin Milan, MD   8 months ago Migraine without aura and without status migrainosus, not intractable   [facility] Reubin Milan, MD   11 months ago Current moderate episode of major depressive disorder without prior episode [facility])   [facility] Reubin Milan, MD       Future Appointments             In 2 months Judithann Graves Nyoka Cowden, MD [facility], [facility]

## 2021-01-29 NOTE — Telephone Encounter (Signed)
Please review. Last office visit 01/2021.  KP

## 2021-02-28 ENCOUNTER — Telehealth (HOSPITAL_COMMUNITY): Payer: Self-pay | Admitting: Emergency Medicine

## 2021-02-28 NOTE — Telephone Encounter (Signed)
Attempted to review CCTA instructions with patient however she informed me that her appt was changed. I explained that I will be in touch with her closer to her next appt but asked if she had any questions at this time and she denied. Pt appreciated the call.  Rockwell Alexandria RN Navigator Cardiac Imaging [facility] [phone] Office  [phone] Cell

## 2021-03-01 ENCOUNTER — Ambulatory Visit: Payer: 59

## 2021-03-14 ENCOUNTER — Other Ambulatory Visit: Payer: Self-pay | Admitting: Internal Medicine

## 2021-03-14 ENCOUNTER — Encounter: Payer: Self-pay | Admitting: Internal Medicine

## 2021-03-14 ENCOUNTER — Ambulatory Visit (INDEPENDENT_AMBULATORY_CARE_PROVIDER_SITE_OTHER): Payer: 59 | Admitting: Internal Medicine

## 2021-03-14 ENCOUNTER — Other Ambulatory Visit: Payer: Self-pay

## 2021-03-14 ENCOUNTER — Telehealth: Payer: Self-pay | Admitting: Internal Medicine

## 2021-03-14 ENCOUNTER — Telehealth (HOSPITAL_COMMUNITY): Payer: Self-pay | Admitting: Emergency Medicine

## 2021-03-14 VITALS — BP 132/80 | HR 95 | Temp 98.3°F | Ht 63.0 in | Wt 216.0 lb

## 2021-03-14 DIAGNOSIS — J209 Acute bronchitis, unspecified: Secondary | ICD-10-CM | POA: Diagnosis not present

## 2021-03-14 MED ORDER — PROMETHAZINE-DM 6.25-15 MG/5ML PO SYRP: 5.0000 mL | ORAL_SOLUTION | Freq: Four times a day (QID) | ORAL | 0 refills | Status: DC | PRN

## 2021-03-14 MED ORDER — CEFDINIR 300 MG PO CAPS: 300.0000 mg | ORAL_CAPSULE | Freq: Two times a day (BID) | ORAL | 0 refills | Status: AC

## 2021-03-14 MED ORDER — ALBUTEROL SULFATE HFA 108 (90 BASE) MCG/ACT IN AERS: 2.0000 | INHALATION_SPRAY | RESPIRATORY_TRACT | 0 refills | Status: DC | PRN

## 2021-03-14 MED ORDER — HYDROCODONE-HOMATROPINE 5-1.5 MG/5ML PO SYRP: 5.0000 mL | ORAL_SOLUTION | Freq: Four times a day (QID) | ORAL | 0 refills | Status: DC | PRN

## 2021-03-14 NOTE — Telephone Encounter (Signed)
Called and spoke with the [facility]. Asked if they have prmethazine DM instead and they do. Dr. Judithann Graves sent this  in-place of the other.

## 2021-03-14 NOTE — Telephone Encounter (Signed)
Nancy Skinner, calling from Pharmacy, stating that the medication, HYDROcodone-homatropine (HYCODAN) 5-1.5 MG/5ML syrup, is not in stock at pharmacy. Please advise.         [facility] [address] [name],  - [address]  [address] [place] [address]  Phone: [phone] Fax: [phone]  Hours: Not open 24 hours

## 2021-03-14 NOTE — Progress Notes (Signed)
Date:  03/14/2021   Name:  Nancy Skinner   DOB:  09/24/1966   MRN:  [id number]   Chief Complaint: Cough (Started 03/12/2021; productive; Covid negative), Sore Throat (Since 03/12/2021; no fever associated; entire throat affected; hoarseness associated; 10/10 pain), and Nasal Congestion (Green mucus)  Cough This is a new problem. The current episode started in the past 7 days. The problem has been unchanged. The problem occurs every few minutes. The cough is non-productive. Associated symptoms include headaches, nasal congestion (and green mucus), a sore throat, shortness of breath and wheezing. Pertinent negatives include no chest pain, chills, fever, postnasal drip or sweats.  Sore Throat  This is a new problem. The current episode started in the past 7 days. Neither side of throat is experiencing more pain than the other. There has been no fever. Associated symptoms include congestion, coughing, headaches, a hoarse voice and shortness of breath. Pertinent negatives include no swollen glands.    Lab Results  Component Value Date   CREATININE 0.78 09/11/2020   BUN 12 09/11/2020   NA 138 09/11/2020   K 3.4 (L) 09/11/2020   CL 102 09/11/2020   CO2 20 09/11/2020   Lab Results  Component Value Date   CHOL 130 09/02/2019   HDL 33 (L) 09/02/2019   LDLCALC 50 09/02/2019   LDLDIRECT 48 09/02/2019   TRIG 307 (H) 09/02/2019   CHOLHDL 3.9 09/02/2019   Lab Results  Component Value Date   TSH 4.070 09/11/2020   Lab Results  Component Value Date   HGBA1C 5.9 06/26/2018   Lab Results  Component Value Date   WBC 11.6 (H) 11/18/2020   HGB 13.5 11/18/2020   HCT 41.7 11/18/2020   MCV 86.0 11/18/2020   PLT 402 (H) 11/18/2020   Lab Results  Component Value Date   ALT 12 09/11/2020   AST 12 09/11/2020   ALKPHOS 83 09/11/2020   BILITOT <0.2 09/11/2020     Review of Systems  Constitutional: Negative for chills and fever.  HENT: Positive for congestion, hoarse voice, sinus  pressure and sore throat. Negative for postnasal drip.   Respiratory: Positive for cough, shortness of breath and wheezing. Negative for chest tightness.   Cardiovascular: Negative for chest pain.  Neurological: Positive for headaches. Negative for dizziness and light-headedness.    Patient Active Problem List   Diagnosis Date Noted  . Carpal tunnel syndrome of right wrist 07/10/2020  . Screening for colon cancer   . Coronary artery disease of native artery of native heart with stable angina pectoris (HCC) 01/18/2019  . Major depression single episode, in partial remission (HCC) 01/18/2019  . Neutrophilic leukocytosis 08/09/2018  . Pulmonary nodule less than 6 cm determined by computed tomography of lung 07/30/2018  . Night sweats 07/16/2018  . Neck muscle spasm 07/15/2018  . Mixed hyperlipidemia 07/15/2018  . Tobacco use disorder, mild, in early remission 07/15/2018  . Migraine without aura and without status migrainosus, not intractable 06/24/2018  . Gastroesophageal reflux disease without esophagitis 06/24/2018  . Chronic midline low back pain without sciatica 06/24/2018    Allergies  Allergen Reactions  . Oxycodone Other (See Comments)    "Black outs" and confusion    Past Surgical History:  Procedure Laterality Date  . ABDOMINAL HYSTERECTOMY  2012   partial for bleeding  . BACK SURGERY    . BREAST BIOPSY Right    neg  . BREAST BIOPSY Left 2015   4 bxs- neg  . BREAST SURGERY Left  multiple biopsies  . CARDIAC CATHETERIZATION    . CATARACT EXTRACTION Right 12/19/2010  . CHOLECYSTECTOMY  1999  . COLONOSCOPY WITH PROPOFOL N/A 03/31/2020   Procedure: COLONOSCOPY WITH PROPOFOL;  Surgeon: Midge Minium, MD;  Location[facility] ENDOSCOPY;  Service: Endoscopy;  Laterality: N/A;  . EYE SURGERY    . INTRAVASCULAR PRESSURE WIRE/FFR STUDY N/A 07/22/2018   Procedure: INTRAVASCULAR PRESSURE WIRE/FFR STUDY;  Surgeon: Yvonne Kendall, MD;  Location: [facility] INVASIVE CV LAB;  Service:  Cardiovascular;  Laterality: N/A;  . LEFT HEART CATH AND CORONARY ANGIOGRAPHY N/A 07/22/2018   Procedure: LEFT HEART CATH AND CORONARY ANGIOGRAPHY;  Surgeon: Yvonne Kendall, MD;  Location: [facility] INVASIVE CV LAB;  Service: Cardiovascular;  Laterality: N/A;  . POSTERIOR LUMBAR FUSION 4 LEVEL  2000    Social History   Tobacco Use  . Smoking status: Former Smoker    Packs/day: 0.25    Years: 40.00    Pack years: 10.00    Types: Cigarettes    Quit date: 09/18/2019    Years since quitting: 1.4  . Smokeless tobacco: Never Used  Vaping Use  . Vaping Use: Never used  Substance Use Topics  . Alcohol use: Yes    Comment: less than once a week.  . Drug use: Never     Medication list has been reviewed and updated.  Current Meds  Medication Sig  . aspirin EC 81 MG tablet Take 81 mg by mouth daily.  Marland Kitchen atorvastatin (LIPITOR) 40 MG tablet TAKE 1 TABLET BY MOUTH EVERY DAY  . buPROPion (WELLBUTRIN XL) 300 MG 24 hr tablet TAKE 1 TABLET(300 MG) BY MOUTH DAILY  . clonazePAM (KLONOPIN) 0.25 MG disintegrating tablet TAKE 1 TABLET BY MOUTH 2 TIMES DAILY AS NEEDED.  . clotrimazole-betamethasone (LOTRISONE) cream APPLY 1 APPLICATION TOPICALLY 2 (TWO) TIMES DAILY. TO RASH ON LEGS  . cyclobenzaprine (FLEXERIL) 10 MG tablet TAKE 1 TABLET BY MOUTH THREE TIMES A DAY AS NEEDED FOR MUSCLE SPASMS  . escitalopram (LEXAPRO) 20 MG tablet TAKE 1 TABLET BY MOUTH DAILY  . Esomeprazole Magnesium (NEXIUM PO) Take 20 mg by mouth daily.   . hydrochlorothiazide (HYDRODIURIL) 25 MG tablet Take 1 tablet (25 mg total) by mouth daily.  . Lidocaine 4 % PTCH Apply 1 patch topically daily as needed (pain).   . metoprolol tartrate (LOPRESSOR) 100 MG tablet Take 1 tablet (100 mg total) by mouth once for 1 dose. Take TWO hours prior to CT procedure  . Multiple Vitamin (MULTIVITAMIN) capsule Take 1 capsule by mouth daily.  . nitroGLYCERIN (NITROSTAT) 0.4 MG SL tablet Place 1 tablet (0.4 mg total) under the tongue every 5 (five)  minutes as needed for chest pain.  . SUMAtriptan (IMITREX) 100 MG tablet TAKE 1 TABLET (100 MG TOTAL) BY MOUTH AS NEEDED FOR MIGRAINE. MAY REPEAT IN 2 HOURS IF NEEDED  . topiramate (TOPAMAX) 25 MG tablet TAKE 1-4 TABLETS (25-100 MG TOTAL) BY MOUTH AT BEDTIME.  Marland Kitchen triamcinolone (KENALOG) 0.025 % ointment Apply 1 application topically as needed. For rash  . Ubrogepant (UBRELVY) 100 MG TABS Take 100 mg by mouth daily as needed.    PHQ 2/9 Scores 03/14/2021 01/22/2021 09/11/2020 07/10/2020  PHQ - 2 Score 0 0 4 4  PHQ- 9 Score 0 0 7 16    GAD 7 : Generalized Anxiety Score 03/14/2021 01/22/2021 09/11/2020 07/10/2020  Nervous, Anxious, on Edge 0 0 3 3  Control/stop worrying 0 0 3 3  Worry too much - different things 0 0 3 3  Trouble  relaxing 0 0 1 2  Restless 0 0 1 0  Easily annoyed or irritable 0 0 3 3  Afraid - awful might happen 0 0 3 3  Total GAD 7 Score 0 0 17 17  Anxiety Difficulty - - Somewhat difficult Not difficult at all    BP Readings from Last 3 Encounters:  03/14/21 132/80  01/25/21 120/72  01/22/21 (!) 144/96    Physical Exam Constitutional:      Appearance: She is well-developed.  HENT:     Nose:     Right Sinus: No maxillary sinus tenderness or frontal sinus tenderness.     Left Sinus: No maxillary sinus tenderness or frontal sinus tenderness.     Mouth/Throat:     Pharynx: No oropharyngeal exudate, posterior oropharyngeal erythema or uvula swelling.     Tonsils: 0 on the right. 0 on the left.  Eyes:     Conjunctiva/sclera: Conjunctivae normal.  Cardiovascular:     Rate and Rhythm: Normal rate and regular rhythm.     Heart sounds: Normal heart sounds.  Pulmonary:     Effort: Pulmonary effort is normal. No respiratory distress.     Breath sounds: No wheezing.  Musculoskeletal:     Cervical back: Normal range of motion.  Lymphadenopathy:     Cervical: No cervical adenopathy.  Skin:    General: Skin is warm and dry.  Neurological:     Mental Status: She is alert.      Wt Readings from Last 3 Encounters:  03/14/21 216 lb (98 kg)  01/25/21 210 lb (95.3 kg)  01/22/21 212 lb (96.2 kg)    BP 132/80 (BP Location: Right Arm, Patient Position: Sitting, Cuff Size: Normal)   Pulse 95   Temp 98.3 F (36.8 C) (Oral)   Ht 5\' 3"  (1.6 m)   Wt 216 lb (98 kg)   SpO2 96%   BMI 38.26 kg/m   Assessment and Plan: 1. Bronchospasm with bronchitis, acute Begin albuterol MDI every 4-6 hours PRN - hold after 11 pm tonight for cardiac CT tomorrow Antibiotics and cough suppressant Try Flonase for nasal congestion - cefdinir (OMNICEF) 300 MG capsule; Take 1 capsule (300 mg total) by mouth 2 (two) times daily for 10 days.  Dispense: 20 capsule; Refill: 0 - HYDROcodone-homatropine (HYCODAN) 5-1.5 MG/5ML syrup; Take 5 mLs by mouth every 6 (six) hours as needed for up to 10 days for cough.  Dispense: 240 mL; Refill: 0 - albuterol (VENTOLIN HFA) 108 (90 Base) MCG/ACT inhaler; Inhale 2 puffs into the lungs every 4 (four) hours as needed for wheezing or shortness of breath.  Dispense: 1 each; Refill: 0   Partially dictated using . Any errors are unintentional.  Animal nutritionist, MD [facility]  03/14/2021

## 2021-03-14 NOTE — Telephone Encounter (Signed)
Reaching out to patient to offer assistance regarding upcoming cardiac imaging study; pt verbalizes understanding of appt date/time, parking situation and where to check in, pre-test NPO status and medications ordered, and verified current allergies; name and call back number provided for further questions should they arise Lyan Holck RN Navigator Cardiac Imaging [facility] [phone] office [phone] cell   100mg metoprolol tartrate  

## 2021-03-15 ENCOUNTER — Ambulatory Visit
Admission: RE | Admit: 2021-03-15 | Discharge: 2021-03-15 | Disposition: A | Discharge status: Home / Self Care | Payer: 59 | Source: Ambulatory Visit | Attending: Family | Admitting: Family

## 2021-03-15 DIAGNOSIS — R072 Precordial pain: Secondary | ICD-10-CM

## 2021-03-15 IMAGING — CR DG WRIST COMPLETE 3+V*R*
4 series · 4 of 4 positions shown · non-contrast
Comparison: None.

CLINICAL DATA: Right wrist pain with decreased range of motion.

EXAM:
RIGHT WRIST - COMPLETE 3+ VIEW

[wrist pa]
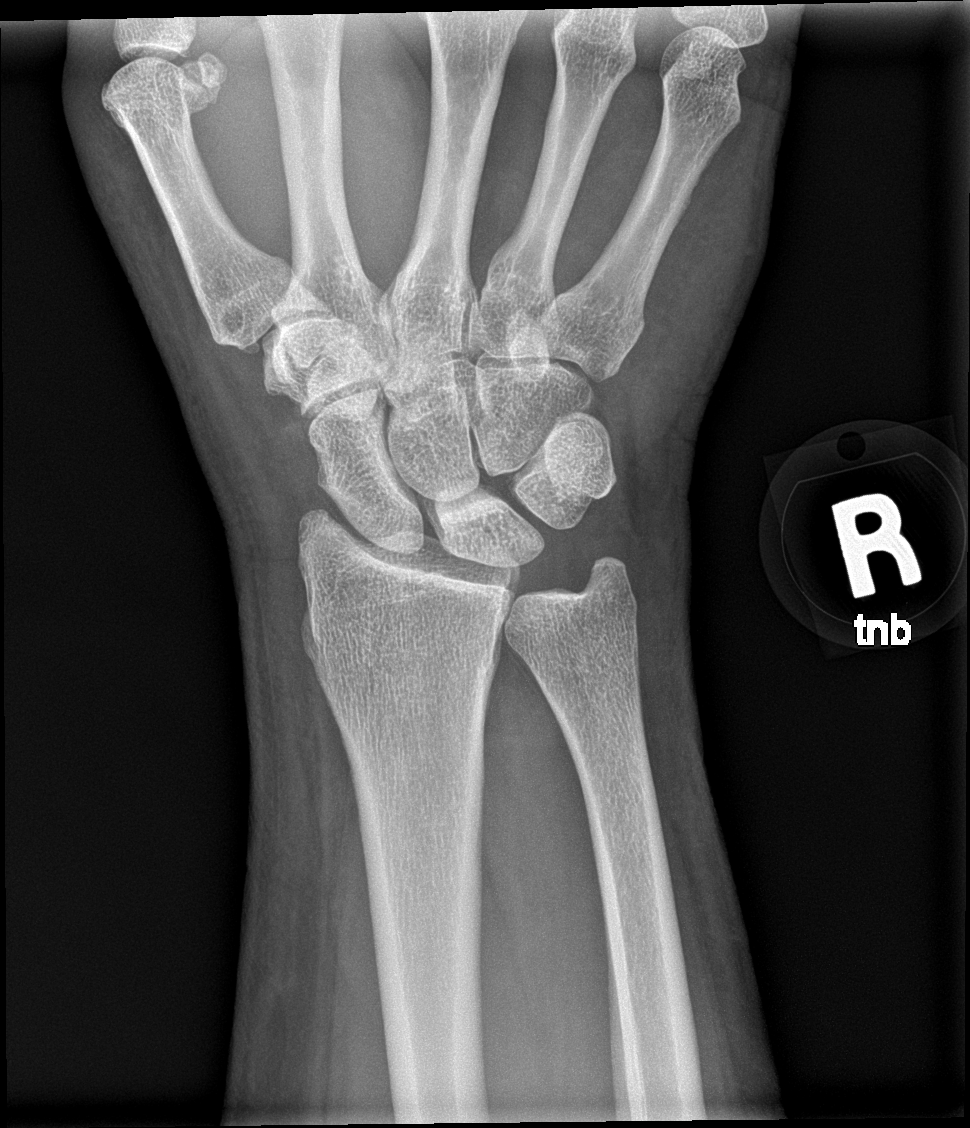

[wrist obl]
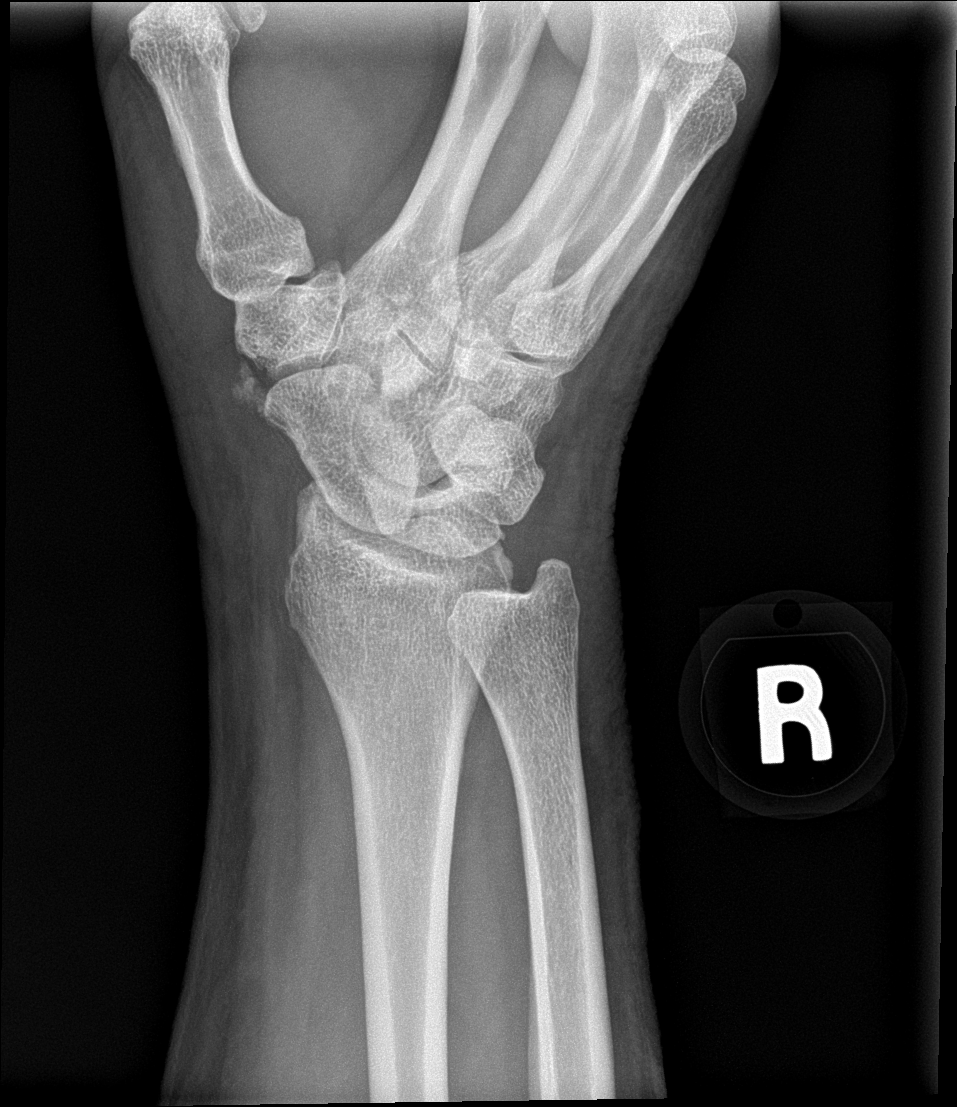

[wrist lat]
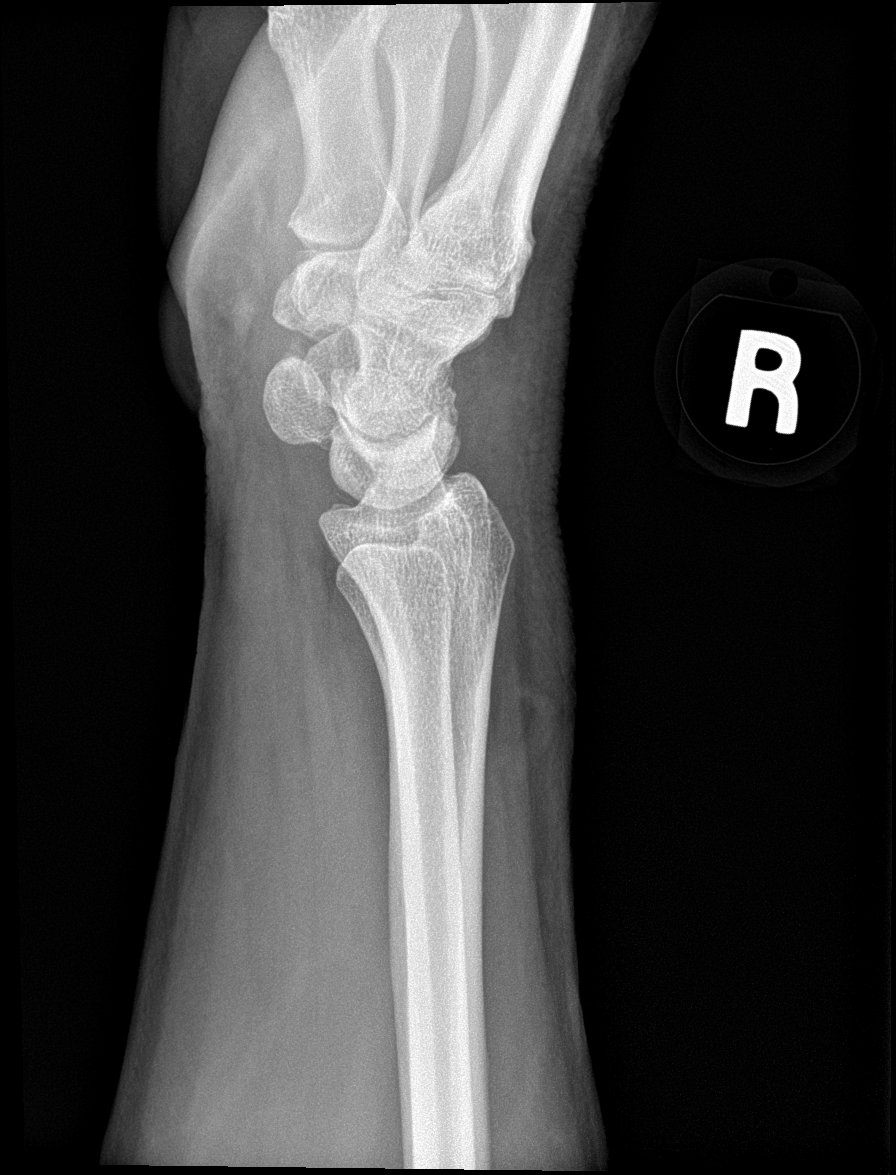

[wrist navicular]
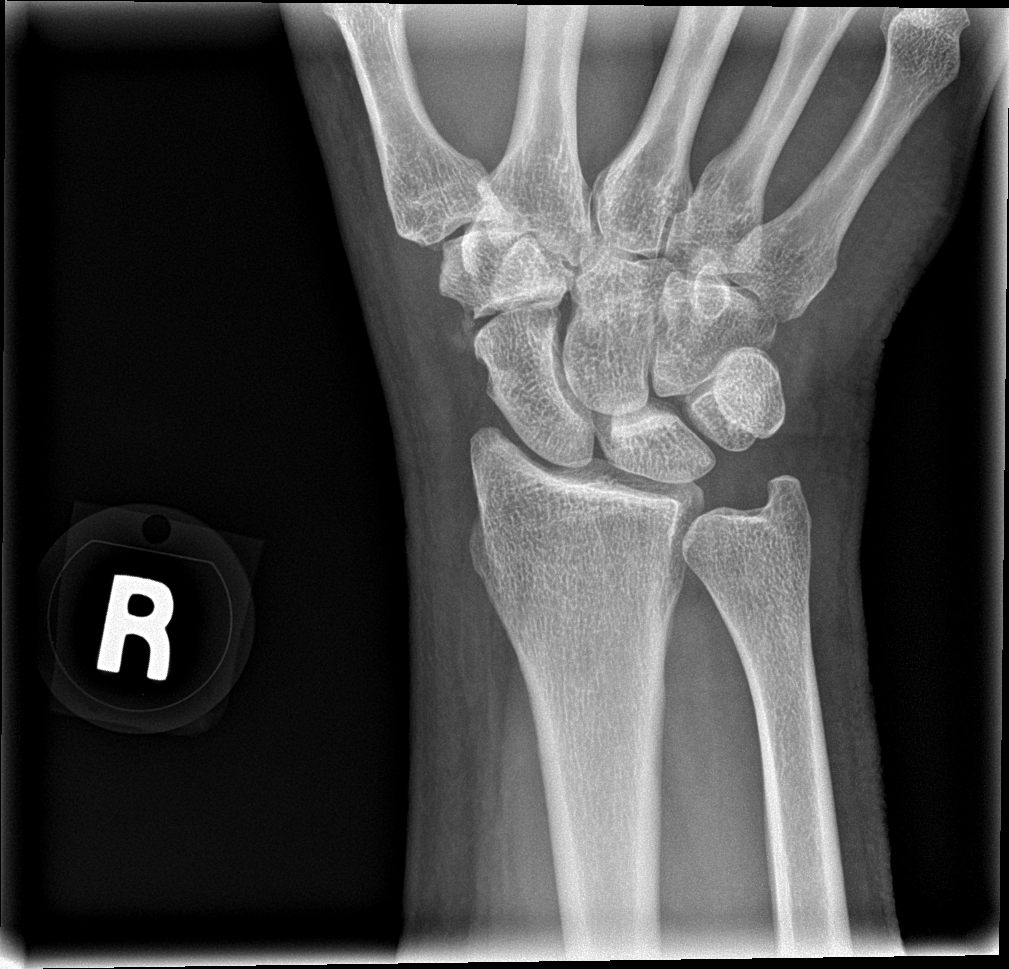

[4 of 4 positions shown; findings below may reference images not displayed]

FINDINGS: No acute fracture or dislocation identified. There is some
heterotopic calcification/ossification adjacent to the distal pole
of the scaphoid and extending towards the trapezium. This may relate
to prior trauma or some type of arthropathy. No significant
proliferative or erosive arthropathy is otherwise identified. No
bony lesions. No significant soft tissue swelling.
IMPRESSION: Heterotopic calcification/ossification adjacent to the distal pole
of the scaphoid and extending towards the trapezium. This may relate
to prior trauma or some type of arthropathy.

## 2021-03-15 MED ORDER — IOHEXOL 350 MG/ML SOLN
100.0000 mL | Freq: Once | INTRAVENOUS | Status: AC | PRN
  Administered 2021-03-15: 100 mL via INTRAVENOUS

## 2021-03-15 MED ORDER — NITROGLYCERIN 0.4 MG SL SUBL
0.8000 mg | SUBLINGUAL_TABLET | Freq: Once | SUBLINGUAL | Status: AC
  Administered 2021-03-15: 0.8 mg via SUBLINGUAL

## 2021-03-15 NOTE — Progress Notes (Signed)
Patient tolerated CT well. Drank water and a soda after. Vital signs stable encourage to drink water throughout day.Reasons explained and verbalized understanding.

## 2021-03-16 DIAGNOSIS — R9430 Abnormal result of cardiovascular function study, unspecified: Secondary | ICD-10-CM | POA: Diagnosis not present

## 2021-03-16 DIAGNOSIS — R072 Precordial pain: Secondary | ICD-10-CM | POA: Diagnosis not present

## 2021-03-19 ENCOUNTER — Other Ambulatory Visit: Payer: Self-pay | Admitting: Internal Medicine

## 2021-03-19 ENCOUNTER — Encounter: Payer: Self-pay | Admitting: Internal Medicine

## 2021-03-19 DIAGNOSIS — K760 Fatty (change of) liver, not elsewhere classified: Secondary | ICD-10-CM

## 2021-04-05 ENCOUNTER — Other Ambulatory Visit: Payer: Self-pay | Admitting: Internal Medicine

## 2021-04-05 DIAGNOSIS — J209 Acute bronchitis, unspecified: Secondary | ICD-10-CM

## 2021-04-05 NOTE — Telephone Encounter (Signed)
Requested Prescriptions  Pending Prescriptions Disp Refills  . albuterol (VENTOLIN HFA) 108 (90 Base) MCG/ACT inhaler [Pharmacy Med Name: ALBUTEROL HFA (PROVENTIL) INH] 6.7 each 1    Sig: INHALE 2 PUFFS INTO THE LUNGS EVERY 4 HOURS AS NEEDED FOR WHEEZING OR SHORTNESS OF BREATH.     Pulmonology:  Beta Agonists Failed - 04/05/2021  1:28 AM      Failed - One inhaler should last at least one month. If the patient is requesting refills earlier, contact the patient to check for uncontrolled symptoms.      Passed - Valid encounter within last 12 months    Recent Outpatient Visits          3 weeks ago Bronchospasm with bronchitis, acute   [facility] Reubin Milan, MD   2 months ago Migraine without aura and without status migrainosus, not intractable   [facility] Reubin Milan, MD   6 months ago Migraine without aura and without status migrainosus, not intractable   [facility] Reubin Milan, MD   8 months ago Carpal tunnel syndrome of right wrist   [facility] Reubin Milan, MD   10 months ago Migraine without aura and without status migrainosus, not intractable   [facility] Reubin Milan, MD      Future Appointments            In 1 week Judithann Graves Nyoka Cowden, MD [facility], [facility]   In 4 months End, Cristal Deer, MD [facility], [facility]

## 2021-04-16 ENCOUNTER — Ambulatory Visit (INDEPENDENT_AMBULATORY_CARE_PROVIDER_SITE_OTHER): Payer: 59 | Admitting: Internal Medicine

## 2021-04-16 ENCOUNTER — Other Ambulatory Visit: Payer: Self-pay

## 2021-04-16 ENCOUNTER — Encounter: Payer: Self-pay | Admitting: Internal Medicine

## 2021-04-16 VITALS — BP 118/78 | HR 90 | Temp 98.0°F | Ht 63.0 in | Wt 202.0 lb

## 2021-04-16 DIAGNOSIS — F324 Major depressive disorder, single episode, in partial remission: Secondary | ICD-10-CM | POA: Diagnosis not present

## 2021-04-16 DIAGNOSIS — I10 Essential (primary) hypertension: Secondary | ICD-10-CM | POA: Diagnosis not present

## 2021-04-16 DIAGNOSIS — F418 Other specified anxiety disorders: Secondary | ICD-10-CM | POA: Diagnosis not present

## 2021-04-16 MED ORDER — BUPROPION HCL ER (XL) 300 MG PO TB24: 300.0000 mg | ORAL_TABLET | Freq: Every day | ORAL | 1 refills | Status: DC

## 2021-04-16 MED ORDER — CLONAZEPAM 0.25 MG PO TBDP: 0.2500 mg | ORAL_TABLET | Freq: Two times a day (BID) | ORAL | 0 refills | Status: DC | PRN

## 2021-04-16 NOTE — Patient Instructions (Signed)
Suicidal Feelings: How to Help Yourself Suicide is when you end your own life. There are many things you can do to help yourself feel better when struggling with these feelings. Many services and people are available to support you and others who struggle with similar feelings.  If you ever feel like you may hurt yourself or others, or have thoughts about taking your own life, get help right away. To get help:  Call your local emergency services (911 in the [place]  The [employer]'s health and human services helpline (211 in the [place]  Go to your nearest emergency department.  Call a suicide hotline to speak with a trained counselor. The following suicide hotlines are available in the [place]: ? 1-800-273-TALK (1-800-273-8255). ? 1-800-SUICIDE (1-800-784-2433). ? 1-888-628-9454. This is a hotline for Spanish speakers. ? 1-800-799-4889. This is a hotline for TTY users. ? 1-866-4-U-TREVOR (1-866-488-7386). This is a hotline for lesbian, gay, bisexual, transgender, or questioning youth. ? For a list of hotlines in [place], visit www.suicide.org/hotlines/international/[place]-suicide-hotlines.html  Contact a crisis center or a local suicide prevention center. To find a crisis center or suicide prevention center: ? Call your local hospital, clinic, community service organization, mental health center, social service provider, or health department. Ask for help with connecting to a crisis center. ? For a list of crisis centers in the [place], visit: suicidepreventionlifeline.org ? For a list of crisis centers in [place], visit: suicideprevention.ca How to help yourself feel better  Promise yourself that you will not do anything extreme when you have suicidal feelings. Remember the times you have felt hopeful. Many people have gotten through suicidal thoughts and feelings, and you can too. If you have had these feelings before, remind yourself that you can get through them again.  Let  family, friends, teachers, or counselors know how you are feeling. Try not to separate yourself from those who care about you and want to help you. Talk with someone every day, even if you do not feel sociable. Face-to-face conversation is best to help them understand your feelings.  Contact a mental health care provider and work with this person regularly.  Make a safety plan that you can follow during a crisis. Include phone numbers of suicide prevention hotlines, mental health professionals, and trusted friends and family members you can call during an emergency. Save these numbers on your phone.  If you are thinking of taking a lot of medicine, give your medicine to someone who can give it to you as prescribed. If you are on antidepressants and are concerned you will overdose, tell your health care provider so that he or she can give you safer medicines.  Try to stick to your routines and follow a schedule every day. Make self-care a priority.  Make a list of realistic goals, and cross them off when you achieve them. Accomplishments can give you a sense of worth.  Wait until you are feeling better before doing things that you find difficult or unpleasant.  Do things that you have always enjoyed to take your mind off your feelings. Try reading a book, or listening to or playing music. Spending time outside, in nature, may help you feel better.   Follow these instructions at home:  Visit your primary health care provider every year for a checkup.  Work with a mental health care provider as needed.  Eat a well-balanced diet, and eat regular meals.  Get plenty of rest.  Exercise if you are able. Just 30 minutes of   exercise each day can help you feel better.  Take over-the-counter and prescription medicines only as told by your health care provider. Ask your mental health care provider about the possible side effects of any medicines you are taking.  Do not use alcohol or drugs, and  remove these substances from your home.  Remove weapons, poisons, knives, and other deadly items from your home.   General recommendations  Keep your living space well lit.  When you are feeling well, write yourself a letter with tips and support that you can read when you are not feeling well.  Remember that life's difficulties can be sorted out with help. Conditions can be treated, and you can learn behaviors and ways of thinking that will help you. Where to find more information  National Suicide Prevention Lifeline: www.suicidepreventionlifeline.org  Hopeline: www.hopeline.com  American Foundation for Suicide Prevention: www.afsp.org  The Trevor Project (for lesbian, gay, bisexual, transgender, or questioning youth): www.thetrevorproject.org  National Institute of Mental Health: https://www.nimh.nih.gov/health/topics/suicide-prevention Contact a health care provider if:  You feel as though you are a burden to others.  You feel agitated, angry, vengeful, or have extreme mood swings.  You have withdrawn from family and friends. Get help right away if:  You are talking about suicide or wishing to die.  You start making plans for how to commit suicide.  You feel that you have no reason to live.  You start making plans for putting your affairs in order, saying goodbye, or giving your possessions away.  You feel guilt, shame, or unbearable pain, and it seems like there is no way out.  You are frequently using drugs or alcohol.  You are engaging in risky behaviors that could lead to death. If you have any of these symptoms, get help right away. Call emergency services, go to your nearest emergency department or crisis center, or call a suicide crisis helpline. Summary  Suicide is when you take your own life.  Promise yourself that you will not do anything extreme when you have suicidal feelings.  Let family, friends, teachers, or counselors know how you are  feeling.  Get help right away if you start making plans for how to commit suicide. This information is not intended to replace advice given to you by your health care provider. Make sure you discuss any questions you have with your health care provider. Document Revised: 08/18/2020 Document Reviewed: 08/18/2020 Elsevier Patient Education  2021 Elsevier Inc.  

## 2021-04-16 NOTE — Progress Notes (Signed)
Date:  04/16/2021   Name:  Nancy Skinner   DOB:  1966/04/25   MRN:  [id number]   Chief Complaint: Hypertension  Hypertension This is a chronic problem. The problem is controlled. Associated symptoms include anxiety. Pertinent negatives include no headaches, malaise/fatigue, palpitations or shortness of breath. Past treatments include diuretics (metoprolol stopped 3 months ago). The current treatment provides moderate improvement. There are no compliance problems.   Depression        This is a chronic problem.  The problem occurs constantly.  The problem has been gradually worsening since onset.  Associated symptoms include fatigue, hopelessness, irritable, decreased interest and sad.  Associated symptoms include no headaches and no suicidal ideas.     The symptoms are aggravated by family issues (grandson with behavior issues that she is unable to change).  Past treatments include SSRIs - Selective serotonin reuptake inhibitors and other medications.  Compliance with treatment is good.  Past medical history includes anxiety.     Lab Results  Component Value Date   CREATININE 0.78 09/11/2020   BUN 12 09/11/2020   NA 138 09/11/2020   K 3.4 (L) 09/11/2020   CL 102 09/11/2020   CO2 20 09/11/2020   Lab Results  Component Value Date   CHOL 130 09/02/2019   HDL 33 (L) 09/02/2019   LDLCALC 50 09/02/2019   LDLDIRECT 48 09/02/2019   TRIG 307 (H) 09/02/2019   CHOLHDL 3.9 09/02/2019   Lab Results  Component Value Date   TSH 4.070 09/11/2020   Lab Results  Component Value Date   HGBA1C 5.9 06/26/2018   Lab Results  Component Value Date   WBC 11.6 (H) 11/18/2020   HGB 13.5 11/18/2020   HCT 41.7 11/18/2020   MCV 86.0 11/18/2020   PLT 402 (H) 11/18/2020   Lab Results  Component Value Date   ALT 12 09/11/2020   AST 12 09/11/2020   ALKPHOS 83 09/11/2020   BILITOT <0.2 09/11/2020     Review of Systems  Constitutional: Positive for fatigue. Negative for malaise/fatigue.   HENT: Negative for trouble swallowing.   Respiratory: Negative for shortness of breath.   Cardiovascular: Negative for palpitations.  Gastrointestinal: Negative for abdominal pain, diarrhea and nausea.  Neurological: Negative for headaches.  Psychiatric/Behavioral: Positive for depression, dysphoric mood and sleep disturbance. Negative for suicidal ideas. The patient is nervous/anxious.     Patient Active Problem List   Diagnosis Date Noted  . Hepatic steatosis 03/19/2021  . Carpal tunnel syndrome of right wrist 07/10/2020  . Screening for colon cancer   . Coronary artery disease of native artery of native heart with stable angina pectoris (HCC) 01/18/2019  . Major depression single episode, in partial remission (HCC) 01/18/2019  . Neutrophilic leukocytosis 08/09/2018  . Pulmonary nodule less than 6 cm determined by computed tomography of lung 07/30/2018  . Night sweats 07/16/2018  . Neck muscle spasm 07/15/2018  . Mixed hyperlipidemia 07/15/2018  . Tobacco use disorder, mild, in early remission 07/15/2018  . Migraine without aura and without status migrainosus, not intractable 06/24/2018  . Gastroesophageal reflux disease without esophagitis 06/24/2018  . Chronic midline low back pain without sciatica 06/24/2018    Allergies  Allergen Reactions  . Oxycodone Other (See Comments)    "Black outs" and confusion    Past Surgical History:  Procedure Laterality Date  . ABDOMINAL HYSTERECTOMY  2012   partial for bleeding  . BACK SURGERY    . BREAST BIOPSY Right    neg  .  BREAST BIOPSY Left 2015   4 bxs- neg  . BREAST SURGERY Left    multiple biopsies  . CARDIAC CATHETERIZATION    . CATARACT EXTRACTION Right 12/19/2010  . CHOLECYSTECTOMY  1999  . COLONOSCOPY WITH PROPOFOL N/A 03/31/2020   Procedure: COLONOSCOPY WITH PROPOFOL;  Surgeon: Midge Minium, MD;  Location: [facility] ENDOSCOPY;  Service: Endoscopy;  Laterality: N/A;  . EYE SURGERY    . INTRAVASCULAR PRESSURE WIRE/FFR STUDY  N/A 07/22/2018   Procedure: INTRAVASCULAR PRESSURE WIRE/FFR STUDY;  Surgeon: Yvonne Kendall, MD;  Location: [facility] INVASIVE CV LAB;  Service: Cardiovascular;  Laterality: N/A;  . LEFT HEART CATH AND CORONARY ANGIOGRAPHY N/A 07/22/2018   Procedure: LEFT HEART CATH AND CORONARY ANGIOGRAPHY;  Surgeon: Yvonne Kendall, MD;  Location: [facility] INVASIVE CV LAB;  Service: Cardiovascular;  Laterality: N/A;  . POSTERIOR LUMBAR FUSION 4 LEVEL  2000    Social History   Tobacco Use  . Smoking status: Former Smoker    Packs/day: 0.25    Years: 40.00    Pack years: 10.00    Types: Cigarettes    Quit date: 09/18/2019    Years since quitting: 1.5  . Smokeless tobacco: Never Used  Vaping Use  . Vaping Use: Never used  Substance Use Topics  . Alcohol use: Yes    Comment: less than once a week.  . Drug use: Never     Medication list has been reviewed and updated.  Current Meds  Medication Sig  . aspirin EC 81 MG tablet Take 81 mg by mouth daily.  Marland Kitchen atorvastatin (LIPITOR) 40 MG tablet TAKE 1 TABLET BY MOUTH EVERY DAY  . buPROPion (WELLBUTRIN XL) 300 MG 24 hr tablet TAKE 1 TABLET(300 MG) BY MOUTH DAILY  . clonazePAM (KLONOPIN) 0.25 MG disintegrating tablet TAKE 1 TABLET BY MOUTH 2 TIMES DAILY AS NEEDED.  . clotrimazole-betamethasone (LOTRISONE) cream APPLY 1 APPLICATION TOPICALLY 2 (TWO) TIMES DAILY. TO RASH ON LEGS  . cyclobenzaprine (FLEXERIL) 10 MG tablet TAKE 1 TABLET BY MOUTH THREE TIMES A DAY AS NEEDED FOR MUSCLE SPASMS  . escitalopram (LEXAPRO) 20 MG tablet TAKE 1 TABLET BY MOUTH DAILY  . Esomeprazole Magnesium (NEXIUM PO) Take 20 mg by mouth daily.   . hydrochlorothiazide (HYDRODIURIL) 25 MG tablet Take 1 tablet (25 mg total) by mouth daily.  . Lidocaine 4 % PTCH Apply 1 patch topically daily as needed (pain).   . metoprolol tartrate (LOPRESSOR) 100 MG tablet Take 1 tablet (100 mg total) by mouth once for 1 dose. Take TWO hours prior to CT procedure  . Multiple Vitamin (MULTIVITAMIN) capsule  Take 1 capsule by mouth daily.  . nitroGLYCERIN (NITROSTAT) 0.4 MG SL tablet Place 1 tablet (0.4 mg total) under the tongue every 5 (five) minutes as needed for chest pain.  . promethazine-dextromethorphan (PROMETHAZINE-DM) 6.25-15 MG/5ML syrup Take 5 mLs by mouth 4 (four) times daily as needed for cough.  . SUMAtriptan (IMITREX) 100 MG tablet TAKE 1 TABLET (100 MG TOTAL) BY MOUTH AS NEEDED FOR MIGRAINE. MAY REPEAT IN 2 HOURS IF NEEDED  . topiramate (TOPAMAX) 25 MG tablet TAKE 1-4 TABLETS (25-100 MG TOTAL) BY MOUTH AT BEDTIME.  Marland Kitchen triamcinolone (KENALOG) 0.025 % ointment Apply 1 application topically as needed. For rash  . Ubrogepant (UBRELVY) 100 MG TABS Take 100 mg by mouth daily as needed.    PHQ 2/9 Scores 04/16/2021 03/14/2021 01/22/2021 09/11/2020  PHQ - 2 Score 6 0 0 4  PHQ- 9 Score 18 0 0 7    GAD 7 :  Generalized Anxiety Score 04/16/2021 03/14/2021 01/22/2021 09/11/2020  Nervous, Anxious, on Edge 3 0 0 3  Control/stop worrying 3 0 0 3  Worry too much - different things 3 0 0 3  Trouble relaxing 3 0 0 1  Restless 3 0 0 1  Easily annoyed or irritable 3 0 0 3  Afraid - awful might happen 0 0 0 3  Total GAD 7 Score 18 0 0 17  Anxiety Difficulty Somewhat difficult - - Somewhat difficult    BP Readings from Last 3 Encounters:  04/16/21 118/78  03/15/21 102/66  03/14/21 132/80    Physical Exam Vitals and nursing note reviewed.  Constitutional:      General: She is irritable. She is not in acute distress.    Appearance: Normal appearance. She is well-developed.  HENT:     Head: Normocephalic and atraumatic.  Cardiovascular:     Rate and Rhythm: Normal rate and regular rhythm.     Pulses: Normal pulses.     Heart sounds: No murmur heard.   Pulmonary:     Effort: Pulmonary effort is normal. No respiratory distress.     Breath sounds: No wheezing or rhonchi.  Musculoskeletal:     Cervical back: Normal range of motion.     Right lower leg: No edema.     Left lower leg: No edema.   Lymphadenopathy:     Cervical: No cervical adenopathy.  Skin:    General: Skin is warm and dry.     Findings: No rash.  Neurological:     Mental Status: She is alert and oriented to person, place, and time.  Psychiatric:        Mood and Affect: Mood is anxious. Affect is tearful.        Speech: Speech normal.        Behavior: Behavior normal.        Thought Content: Thought content normal. Thought content does not include suicidal ideation. Thought content does not include suicidal plan.        Judgment: Judgment normal.     Wt Readings from Last 3 Encounters:  04/16/21 202 lb (91.6 kg)  03/14/21 216 lb (98 kg)  01/25/21 210 lb (95.3 kg)    BP 118/78   Pulse 90   Temp 98 F (36.7 C) (Oral)   Ht 5\' 3"  (1.6 m)   Wt 202 lb (91.6 kg)   SpO2 98%   BMI 35.78 kg/m   Assessment and Plan: 1. Essential hypertension Clinically stable exam with well controlled BP on hctz. Tolerating medications without side effects at this time. Pt to continue current regimen and low sodium diet; benefits of regular exercise as able discussed.  2. Major depression single episode, in partial remission (HCC) Worsening with family issues - pt urged to see counseling or psych care - list of providers given. Continue bupropion and Lexapro - buPROPion (WELLBUTRIN XL) 300 MG 24 hr tablet; Take 1 tablet (300 mg total) by mouth daily.  Dispense: 90 tablet; Refill: 1  3. Depression with anxiety Continue clonazepam PRN severe anxiety/insomnia - clonazePAM (KLONOPIN) 0.25 MG disintegrating tablet; Take 1 tablet (0.25 mg total) by mouth 2 (two) times daily as needed for seizure.  Dispense: 60 tablet; Refill: 0   Partially dictated using . Any errors are unintentional.  Animal nutritionist, MD [facility]  04/16/2021

## 2021-04-23 ENCOUNTER — Ambulatory Visit: Admission: RE | Admit: 2021-04-23 | Payer: 59 | Source: Ambulatory Visit

## 2021-04-30 ENCOUNTER — Other Ambulatory Visit: Payer: Self-pay | Admitting: Internal Medicine

## 2021-04-30 DIAGNOSIS — E782 Mixed hyperlipidemia: Secondary | ICD-10-CM

## 2021-04-30 NOTE — Telephone Encounter (Signed)
Requested Prescriptions  Pending Prescriptions Disp Refills  . atorvastatin (LIPITOR) 40 MG tablet [Pharmacy Med Name: ATORVASTATIN 40 MG TABLET] 90 tablet 0    Sig: TAKE 1 TABLET BY MOUTH EVERY DAY     Cardiovascular:  Antilipid - Statins Failed - 04/30/2021  1:28 AM      Failed - Total Cholesterol in normal range and within 360 days    Cholesterol, Total  Date Value Ref Range Status  09/02/2019 130 100 - 199 mg/dL Final         Failed - LDL in normal range and within 360 days    LDL Chol Calc (NIH)  Date Value Ref Range Status  09/02/2019 50 0 - 99 mg/dL Final   LDL Direct  Date Value Ref Range Status  09/02/2019 48 0 - 99 mg/dL Final         Failed - HDL in normal range and within 360 days    HDL  Date Value Ref Range Status  09/02/2019 33 (L) >39 mg/dL Final         Failed - Triglycerides in normal range and within 360 days    Triglycerides  Date Value Ref Range Status  09/02/2019 307 (H) 0 - 149 mg/dL Final         Passed - Patient is not pregnant      Passed - Valid encounter within last 12 months    Recent Outpatient Visits          2 weeks ago Essential hypertension   [facility] Reubin Milan, MD   1 month ago Bronchospasm with bronchitis, acute   [facility] Reubin Milan, MD   3 months ago Migraine without aura and without status migrainosus, not intractable   [facility] Reubin Milan, MD   7 months ago Migraine without aura and without status migrainosus, not intractable   [facility] Reubin Milan, MD   9 months ago Carpal tunnel syndrome of right wrist   [facility] Reubin Milan, MD      Future Appointments            In 3 months End, Cristal Deer, MD [facility], [facility]

## 2021-05-17 ENCOUNTER — Other Ambulatory Visit: Payer: Self-pay

## 2021-05-27 ENCOUNTER — Other Ambulatory Visit: Payer: Self-pay | Admitting: Internal Medicine

## 2021-05-27 DIAGNOSIS — F418 Other specified anxiety disorders: Secondary | ICD-10-CM

## 2021-05-27 NOTE — Telephone Encounter (Signed)
Requested medication (s) are due for refill today: yes  Requested medication (s) are on the active medication list: yes  Last refill:  04/16/21  Future visit scheduled: no  Notes to clinic:  med not delegated to NT to RF   Requested Prescriptions  Pending Prescriptions Disp Refills   clonazePAM (KLONOPIN) 0.25 MG disintegrating tablet [Pharmacy Med Name: CLONAZEPAM 0.25 MG ODT] 60 tablet 0    Sig: Take 1 tablet (0.25 mg total) by mouth 2 (two) times daily as needed for seizure.      Not Delegated - Psychiatry:  Anxiolytics/Hypnotics Failed - 05/27/2021 12:16 PM      Failed - This refill cannot be delegated      Failed - Urine Drug Screen completed in last 360 days      Passed - Valid encounter within last 6 months    Recent Outpatient Visits           1 month ago Essential hypertension   [facility] Reubin Milan, MD   2 months ago Bronchospasm with bronchitis, acute   [facility] Reubin Milan, MD   4 months ago Migraine without aura and without status migrainosus, not intractable   [facility] Reubin Milan, MD   8 months ago Migraine without aura and without status migrainosus, not intractable   [facility] Reubin Milan, MD   10 months ago Carpal tunnel syndrome of right wrist   [facility] Reubin Milan, MD       Future Appointments             In 2 months End, Cristal Deer, MD [facility], [facility]

## 2021-05-28 NOTE — Telephone Encounter (Signed)
Please review. Last office visit 04/16/2021.  KP

## 2021-06-30 ENCOUNTER — Other Ambulatory Visit: Payer: Self-pay | Admitting: Internal Medicine

## 2021-06-30 DIAGNOSIS — G43009 Migraine without aura, not intractable, without status migrainosus: Secondary | ICD-10-CM

## 2021-06-30 NOTE — Telephone Encounter (Signed)
Requested medication (s) are due for refill today: yes  Requested medication (s) are on the active medication list: yes  Last refill:  10/02/20 #360 1 RF  Future visit scheduled: no  Notes to clinic:  not delegated to NT to RF    Requested Prescriptions  Pending Prescriptions Disp Refills   topiramate (TOPAMAX) 25 MG tablet [Pharmacy Med Name: TOPIRAMATE 25 MG TABLET] 360 tablet 1    Sig: TAKE 1-4 TABLETS (25-100 MG TOTAL) BY MOUTH AT BEDTIME.      Not Delegated - Neurology: Anticonvulsants - topiramate & zonisamide Failed - 06/30/2021  6:03 AM      Failed - This refill cannot be delegated      Passed - Cr in normal range and within 360 days    Creatinine, Ser  Date Value Ref Range Status  09/11/2020 0.78 0.57 - 1.00 mg/dL Final          Passed - CO2 in normal range and within 360 days    CO2  Date Value Ref Range Status  09/11/2020 20 20 - 29 mmol/L Final          Passed - Valid encounter within last 12 months    Recent Outpatient Visits           2 months ago Essential hypertension   [facility] Reubin Milan, MD   3 months ago Bronchospasm with bronchitis, acute   [facility] Reubin Milan, MD   5 months ago Migraine without aura and without status migrainosus, not intractable   [facility] Reubin Milan, MD   9 months ago Migraine without aura and without status migrainosus, not intractable   [facility] Reubin Milan, MD   11 months ago Carpal tunnel syndrome of right wrist   [facility] Reubin Milan, MD       Future Appointments             In 1 month End, Cristal Deer, MD [facility] [address], [facility]

## 2021-07-16 ENCOUNTER — Other Ambulatory Visit: Payer: Self-pay | Admitting: Internal Medicine

## 2021-07-16 DIAGNOSIS — E782 Mixed hyperlipidemia: Secondary | ICD-10-CM

## 2021-07-16 DIAGNOSIS — B354 Tinea corporis: Secondary | ICD-10-CM

## 2021-07-16 DIAGNOSIS — M545 Low back pain, unspecified: Secondary | ICD-10-CM

## 2021-07-16 DIAGNOSIS — G8929 Other chronic pain: Secondary | ICD-10-CM

## 2021-07-16 DIAGNOSIS — J209 Acute bronchitis, unspecified: Secondary | ICD-10-CM

## 2021-07-16 NOTE — Telephone Encounter (Signed)
Requested medications are due for refill today yes  Requested medications are on the active medication list yes  Last refill Flexeril 3/17, Lipitor 5/16   Last visit Seen 04/2021 but not for these meds, seen 01/2021, lipid lab ordered but not drawn, last lab 08/2019  Future visit scheduled no  Notes to clinic Flexeril is Not Delegated, Lipitor failed protocol of lab work within 360 days.

## 2021-07-20 ENCOUNTER — Other Ambulatory Visit: Payer: Self-pay | Admitting: Internal Medicine

## 2021-07-20 DIAGNOSIS — Z1231 Encounter for screening mammogram for malignant neoplasm of breast: Secondary | ICD-10-CM

## 2021-07-24 ENCOUNTER — Telehealth: Payer: Self-pay

## 2021-07-24 ENCOUNTER — Other Ambulatory Visit: Payer: Self-pay

## 2021-07-24 DIAGNOSIS — G43009 Migraine without aura, not intractable, without status migrainosus: Secondary | ICD-10-CM

## 2021-07-24 MED ORDER — EMGALITY 120 MG/ML ~~LOC~~ SOAJ: 120.0000 mg | SUBCUTANEOUS | 1 refills | Status: DC

## 2021-07-24 NOTE — Telephone Encounter (Signed)
Sent in [place]. Patient informed.

## 2021-07-24 NOTE — Telephone Encounter (Signed)
Patient would like to start back on Emgality injections.Informed her you will be on vacation until Monday but will send you a msg over.  Thank you.

## 2021-08-22 ENCOUNTER — Ambulatory Visit: Payer: 59 | Admitting: Internal Medicine

## 2021-08-22 NOTE — Progress Notes (Deleted)
Follow-up Outpatient Visit Date: 08/22/2021  Primary Care Provider: Reubin Milan, MD [address] [place] [address]  Chief Complaint: ***  HPI:  [name] is a 55 y.o. female with history of nonobstructive CAD, HLD, HTN, migraine disorder, and depression, who presents for follow-up of CAD.  She was last seen in our office in February by Gillian Shields, NP, at which time she noted improved blood pressures with HCTZ.  She complained of occasional vague discomfort in her chest associated with numbness radiating to the left arm.  Coronary CT showed moderate plaque in the LAD that was not hemodynamically significant by CT FFR.  --------------------------------------------------------------------------------------------------  Past Medical History:  Diagnosis Date   Coronary artery disease, non-occlusive    a. LHC 07/22/18: LM nl, mLAD 60% w/ FFR 0.83, LCx nl, RCA nl, EF 55-65%, nl LVEDP, no AS.   Depression    Lumbar disc disease    Migraines    Mixed hyperlipidemia    Past Surgical History:  Procedure Laterality Date   ABDOMINAL HYSTERECTOMY  2012   partial for bleeding   BACK SURGERY     BREAST BIOPSY Right    neg   BREAST BIOPSY Left 2015   4 bxs- neg   BREAST SURGERY Left    multiple biopsies   CARDIAC CATHETERIZATION     CATARACT EXTRACTION Right 12/19/2010   CHOLECYSTECTOMY  1999   COLONOSCOPY WITH PROPOFOL N/A 03/31/2020   Procedure: COLONOSCOPY WITH PROPOFOL;  Surgeon: Midge Minium, MD;  Location: [facility] ENDOSCOPY;  Service: Endoscopy;  Laterality: N/A;   EYE SURGERY     INTRAVASCULAR PRESSURE WIRE/FFR STUDY N/A 07/22/2018   Procedure: INTRAVASCULAR PRESSURE WIRE/FFR STUDY;  Surgeon: Yvonne Kendall, MD;  Location: [facility] INVASIVE CV LAB;  Service: Cardiovascular;  Laterality: N/A;   LEFT HEART CATH AND CORONARY ANGIOGRAPHY N/A 07/22/2018   Procedure: LEFT HEART CATH AND CORONARY ANGIOGRAPHY;  Surgeon: Yvonne Kendall, MD;  Location: [facility] INVASIVE CV LAB;   Service: Cardiovascular;  Laterality: N/A;   POSTERIOR LUMBAR FUSION 4 LEVEL  2000    No outpatient medications have been marked as taking for the 08/22/21 encounter (Appointment) with Kimberleigh Mehan, Cristal Deer, MD.    Allergies: Oxycodone  Social History   Tobacco Use   Smoking status: Former    Packs/day: 0.25    Years: 40.00    Pack years: 10.00    Types: Cigarettes    Quit date: 09/18/2019    Years since quitting: 1.9   Smokeless tobacco: Never  Vaping Use   Vaping Use: Never used  Substance Use Topics   Alcohol use: Yes    Comment: less than once a week.   Drug use: Never    Family History  Problem Relation Age of Onset   Breast cancer Mother 3   Heart attack Father 60   Alcohol abuse Father    Breast cancer Maternal Grandmother    Breast cancer Paternal Grandmother     Review of Systems: A 12-system review of systems was performed and was negative except as noted in the HPI.  --------------------------------------------------------------------------------------------------  Physical Exam: There were no vitals taken for this visit.  General:  NAD. Neck: No JVD or HJR. Lungs: Clear to auscultation bilaterally without wheezes or crackles. Heart: Regular rate and rhythm without murmurs, rubs, or gallops. Abdomen: Soft, nontender, nondistended. Extremities: No lower extremity edema.  EKG:  ***  Lab Results  Component Value Date   WBC 11.6 (H) 11/18/2020   HGB 13.5 11/18/2020  HCT 41.7 11/18/2020   MCV 86.0 11/18/2020   PLT 402 (H) 11/18/2020    Lab Results  Component Value Date   NA 138 09/11/2020   K 3.4 (L) 09/11/2020   CL 102 09/11/2020   CO2 20 09/11/2020   BUN 12 09/11/2020   CREATININE 0.78 09/11/2020   GLUCOSE 93 09/11/2020   ALT 12 09/11/2020    Lab Results  Component Value Date   CHOL 130 09/02/2019   HDL 33 (L) 09/02/2019   LDLCALC 50 09/02/2019   LDLDIRECT 48 09/02/2019   TRIG 307 (H) 09/02/2019   CHOLHDL 3.9 09/02/2019     --------------------------------------------------------------------------------------------------  ASSESSMENT AND PLAN: Cristal Deer Sheniqua Carolan, MD 08/22/2021 1:24 PM

## 2021-09-19 ENCOUNTER — Other Ambulatory Visit: Payer: Self-pay | Admitting: Internal Medicine

## 2021-09-19 DIAGNOSIS — G43009 Migraine without aura, not intractable, without status migrainosus: Secondary | ICD-10-CM

## 2021-09-20 ENCOUNTER — Other Ambulatory Visit: Payer: Self-pay | Admitting: Internal Medicine

## 2021-09-20 DIAGNOSIS — G8929 Other chronic pain: Secondary | ICD-10-CM

## 2021-09-20 NOTE — Telephone Encounter (Signed)
Requested medications are due for refill today yes  Requested medications are on the active medication list yes  Last refill 07/17/21  Last visit Do not see this med/dx addressed in the past year  Future visit scheduled no  Notes to clinic Flexeril is Not Delegated.

## 2021-10-16 ENCOUNTER — Other Ambulatory Visit: Payer: Self-pay | Admitting: Internal Medicine

## 2021-10-16 DIAGNOSIS — E782 Mixed hyperlipidemia: Secondary | ICD-10-CM

## 2021-10-16 NOTE — Telephone Encounter (Signed)
Requested medications are due for refill today yes  Requested medications are on the active medication list yes  Last refill 07/17/21  Last visit 04/16/21  Future visit scheduled NO  Notes to clinic failed protocol due to lipid labs last obtained 08/2019, greater than 360 days, no upcoming visit scheduled.  Requested Prescriptions  Pending Prescriptions Disp Refills   atorvastatin (LIPITOR) 40 MG tablet [Pharmacy Med Name: ATORVASTATIN 40 MG TABLET] 90 tablet 0    Sig: TAKE 1 TABLET BY MOUTH EVERY DAY     Cardiovascular:  Antilipid - Statins Failed - 10/16/2021  1:28 AM      Failed - Total Cholesterol in normal range and within 360 days    Cholesterol, Total  Date Value Ref Range Status  09/02/2019 130 100 - 199 mg/dL Final          Failed - LDL in normal range and within 360 days    LDL Chol Calc (NIH)  Date Value Ref Range Status  09/02/2019 50 0 - 99 mg/dL Final   LDL Direct  Date Value Ref Range Status  09/02/2019 48 0 - 99 mg/dL Final          Failed - HDL in normal range and within 360 days    HDL  Date Value Ref Range Status  09/02/2019 33 (L) >39 mg/dL Final          Failed - Triglycerides in normal range and within 360 days    Triglycerides  Date Value Ref Range Status  09/02/2019 307 (H) 0 - 149 mg/dL Final          Passed - Patient is not pregnant      Passed - Valid encounter within last 12 months    Recent Outpatient Visits           6 months ago Essential hypertension   [facility] Reubin Milan, MD   7 months ago Bronchospasm with bronchitis, acute   [facility] Reubin Milan, MD   8 months ago Migraine without aura and without status migrainosus, not intractable   [facility] Reubin Milan, MD   1 year ago Migraine without aura and without status migrainosus, not intractable   [facility] Reubin Milan, MD   1 year ago Carpal tunnel syndrome of right wrist   [facility]  Reubin Milan, MD

## 2021-10-23 ENCOUNTER — Other Ambulatory Visit: Payer: Self-pay | Admitting: Internal Medicine

## 2021-10-23 DIAGNOSIS — J209 Acute bronchitis, unspecified: Secondary | ICD-10-CM

## 2021-10-23 DIAGNOSIS — F324 Major depressive disorder, single episode, in partial remission: Secondary | ICD-10-CM

## 2021-10-23 DIAGNOSIS — M545 Low back pain, unspecified: Secondary | ICD-10-CM

## 2021-10-23 DIAGNOSIS — G43009 Migraine without aura, not intractable, without status migrainosus: Secondary | ICD-10-CM

## 2021-10-23 DIAGNOSIS — E782 Mixed hyperlipidemia: Secondary | ICD-10-CM

## 2021-10-23 DIAGNOSIS — B354 Tinea corporis: Secondary | ICD-10-CM

## 2021-10-23 DIAGNOSIS — G8929 Other chronic pain: Secondary | ICD-10-CM

## 2021-10-23 DIAGNOSIS — F321 Major depressive disorder, single episode, moderate: Secondary | ICD-10-CM

## 2021-10-23 NOTE — Telephone Encounter (Signed)
Requested medication (s) are due for refill today: Flexeril  yes    Requested medication (s) are on the active medication list: yes   Last refill: 09/21/21  #90  1 refill  Future visit scheduled no   Notes to clinic: Not delegated        Atorvastatin failed due to labs, please review. Thank you  Requested Prescriptions  Pending Prescriptions Disp Refills   atorvastatin (LIPITOR) 40 MG tablet [Pharmacy Med Name: ATORVASTATIN 40 MG TABLET] 90 tablet 0    Sig: TAKE 1 TABLET BY MOUTH EVERY DAY     Cardiovascular:  Antilipid - Statins Failed - 10/23/2021  8:45 AM      Failed - Total Cholesterol in normal range and within 360 days    Cholesterol, Total  Date Value Ref Range Status  09/02/2019 130 100 - 199 mg/dL Final          Failed - LDL in normal range and within 360 days    LDL Chol Calc (NIH)  Date Value Ref Range Status  09/02/2019 50 0 - 99 mg/dL Final   LDL Direct  Date Value Ref Range Status  09/02/2019 48 0 - 99 mg/dL Final          Failed - HDL in normal range and within 360 days    HDL  Date Value Ref Range Status  09/02/2019 33 (L) >39 mg/dL Final          Failed - Triglycerides in normal range and within 360 days    Triglycerides  Date Value Ref Range Status  09/02/2019 307 (H) 0 - 149 mg/dL Final          Passed - Patient is not pregnant      Passed - Valid encounter within last 12 months    Recent Outpatient Visits           6 months ago Essential hypertension   [facility] Reubin Milan, MD   7 months ago Bronchospasm with bronchitis, acute   [facility] Reubin Milan, MD   9 months ago Migraine without aura and without status migrainosus, not intractable   [facility] Reubin Milan, MD   1 year ago Migraine without aura and without status migrainosus, not intractable   [facility] Reubin Milan, MD   1 year ago Carpal tunnel syndrome of right wrist   [facility] Reubin Milan, MD               cyclobenzaprine (FLEXERIL) 10 MG tablet [Pharmacy Med Name: CYCLOBENZAPRINE 10 MG TABLET] 90 tablet 0    Sig: TAKE 1 TABLET BY MOUTH THREE TIMES A DAY AS NEEDED FOR MUSCLE SPASMS     Not Delegated - Analgesics:  Muscle Relaxants Failed - 10/23/2021  8:45 AM      Failed - This refill cannot be delegated      Failed - Valid encounter within last 6 months    Recent Outpatient Visits           6 months ago Essential hypertension   [facility] Reubin Milan, MD   7 months ago Bronchospasm with bronchitis, acute   [facility] Reubin Milan, MD   9 months ago Migraine without aura and without status migrainosus, not intractable   [facility] Reubin Milan, MD   1 year ago Migraine without aura and without status migrainosus, not intractable   [facility] Reubin Milan, MD   1 year ago Carpal tunnel syndrome of right wrist   [facility] Reubin Milan, MD              Signed Prescriptions Disp Refills   escitalopram (LEXAPRO) 20 MG tablet 90 tablet 0    Sig: TAKE 1 TABLET BY MOUTH EVERY DAY     Psychiatry:  Antidepressants - SSRI Failed - 10/23/2021  8:45 AM      Failed - Valid encounter within last 6 months    Recent Outpatient Visits           6 months ago Essential hypertension   [facility] Reubin Milan, MD   7 months ago Bronchospasm with bronchitis, acute   [facility] Reubin Milan, MD   9 months ago Migraine without aura and without status migrainosus, not intractable   [facility] Reubin Milan, MD   1 year ago Migraine without aura and without status migrainosus, not intractable   [facility] Reubin Milan, MD   1 year ago Carpal tunnel syndrome of right wrist   [facility] Reubin Milan, MD              Passed - Completed PHQ-2 or PHQ-9 in the last 360 days       topiramate (TOPAMAX) 25 MG  tablet 360 tablet 0    Sig: TAKE 1-4 TABLETS (25-100 MG TOTAL) BY MOUTH AT BEDTIME.     Not Delegated - Neurology: Anticonvulsants - topiramate & zonisamide Failed - 10/23/2021  8:45 AM      Failed - This refill cannot be delegated      Failed - Cr in normal range and within 360 days    Creatinine, Ser  Date Value Ref Range Status  09/11/2020 0.78 0.57 - 1.00 mg/dL Final          Failed - CO2 in normal range and within 360 days    CO2  Date Value Ref Range Status  09/11/2020 20 20 - 29 mmol/L Final          Passed - Valid encounter within last 12 months    Recent Outpatient Visits           6 months ago Essential hypertension   [facility] Reubin Milan, MD   7 months ago Bronchospasm with bronchitis, acute   [facility] Reubin Milan, MD   9 months ago Migraine without aura and without status migrainosus, not intractable   [facility] Reubin Milan, MD   1 year ago Migraine without aura and without status migrainosus, not intractable   [facility] Reubin Milan, MD   1 year ago Carpal tunnel syndrome of right wrist   [facility] Reubin Milan, MD               clotrimazole-betamethasone (LOTRISONE) cream 30 g 0    Sig: APPLY 1 APPLICATION TOPICALLY 2 (TWO) TIMES DAILY. TO RASH ON LEGS     Off-Protocol Failed - 10/23/2021  8:45 AM      Failed - Medication not assigned to a protocol, review manually.      Passed - Valid encounter within last 12 months    Recent Outpatient Visits           6 months ago Essential hypertension   [facility] Reubin Milan, MD   7  months ago Bronchospasm with bronchitis, acute   [facility] Reubin Milan, MD   9 months ago Migraine without aura and without status migrainosus, not intractable   [facility] Reubin Milan, MD   1 year ago Migraine without aura and without status migrainosus, not intractable   [facility] Reubin Milan, MD   1 year ago Carpal tunnel syndrome of right wrist   [facility] Reubin Milan, MD               buPROPion (WELLBUTRIN XL) 300 MG 24 hr tablet 90 tablet 0    Sig: TAKE 1 TABLET BY MOUTH EVERY DAY     Psychiatry: Antidepressants - bupropion Failed - 10/23/2021  8:45 AM      Failed - Valid encounter within last 6 months    Recent Outpatient Visits           6 months ago Essential hypertension   [facility] Reubin Milan, MD   7 months ago Bronchospasm with bronchitis, acute   [facility] Reubin Milan, MD   9 months ago Migraine without aura and without status migrainosus, not intractable   [facility] Reubin Milan, MD   1 year ago Migraine without aura and without status migrainosus, not intractable   [facility] Reubin Milan, MD   1 year ago Carpal tunnel syndrome of right wrist   [facility] Reubin Milan, MD              Passed - Completed PHQ-2 or PHQ-9 in the last 360 days      Passed - Last BP in normal range    BP Readings from Last 1 Encounters:  04/16/21 118/78          Refused Prescriptions Disp Refills   albuterol (VENTOLIN HFA) 108 (90 Base) MCG/ACT inhaler [Pharmacy Med Name: ALBUTEROL HFA (PROVENTIL) INH] 6.7 each 1    Sig: INHALE 2 PUFFS INTO THE LUNGS EVERY 4 HOURS AS NEEDED FOR WHEEZE OR FOR SHORTNESS OF BREATH     Pulmonology:  Beta Agonists Failed - 10/23/2021  8:45 AM      Failed - One inhaler should last at least one month. If the patient is requesting refills earlier, contact the patient to check for uncontrolled symptoms.      Passed - Valid encounter within last 12 months    Recent Outpatient Visits           6 months ago Essential hypertension   [facility] Reubin Milan, MD   7 months ago Bronchospasm with bronchitis, acute   [facility] Reubin Milan, MD   9 months ago Migraine without aura  and without status migrainosus, not intractable   [facility] Reubin Milan, MD   1 year ago Migraine without aura and without status migrainosus, not intractable   [facility] Reubin Milan, MD   1 year ago Carpal tunnel syndrome of right wrist   [facility] Reubin Milan, MD

## 2021-10-23 NOTE — Telephone Encounter (Signed)
Courtesy refill provided, #90.

## 2021-10-23 NOTE — Telephone Encounter (Signed)
Discontinued 04/16/21. Not on current med profile

## 2021-11-23 ENCOUNTER — Other Ambulatory Visit: Payer: Self-pay | Admitting: Internal Medicine

## 2021-11-23 DIAGNOSIS — F418 Other specified anxiety disorders: Secondary | ICD-10-CM

## 2021-11-23 NOTE — Telephone Encounter (Signed)
Requested medication (s) are due for refill today: yes  Requested medication (s) are on the active medication list: yes  Last refill:  05/28/21 #60/0RF  Future visit scheduled: no  Notes to clinic:  Unable to refill per protocol, cannot delegate. Drug screening not up to date. Please advise     Requested Prescriptions  Pending Prescriptions Disp Refills   clonazePAM (KLONOPIN) 0.25 MG disintegrating tablet [Pharmacy Med Name: CLONAZEPAM 0.25 MG ODT] 60 tablet 0    Sig: TAKE 1 TABLET BY MOUTH 2 TIMES DAILY AS NEEDED.     Not Delegated - Psychiatry:  Anxiolytics/Hypnotics Failed - 11/23/2021  4:53 PM      Failed - This refill cannot be delegated      Failed - Urine Drug Screen completed in last 360 days      Failed - Valid encounter within last 6 months    Recent Outpatient Visits           7 months ago Essential hypertension   [facility] Reubin Milan, MD   8 months ago Bronchospasm with bronchitis, acute   [facility] Reubin Milan, MD   10 months ago Migraine without aura and without status migrainosus, not intractable   [facility] Reubin Milan, MD   1 year ago Migraine without aura and without status migrainosus, not intractable   [facility] Reubin Milan, MD   1 year ago Carpal tunnel syndrome of right wrist   [facility] Reubin Milan, MD

## 2021-11-26 NOTE — Telephone Encounter (Signed)
Please review. Last office visit 04/2021.  KP

## 2021-12-05 ENCOUNTER — Ambulatory Visit (INDEPENDENT_AMBULATORY_CARE_PROVIDER_SITE_OTHER): Payer: 59 | Admitting: Internal Medicine

## 2021-12-05 ENCOUNTER — Other Ambulatory Visit: Payer: Self-pay

## 2021-12-05 ENCOUNTER — Encounter: Payer: Self-pay | Admitting: Internal Medicine

## 2021-12-05 VITALS — BP 120/88 | HR 91 | Ht 63.0 in | Wt 196.0 lb

## 2021-12-05 DIAGNOSIS — E782 Mixed hyperlipidemia: Secondary | ICD-10-CM | POA: Diagnosis not present

## 2021-12-05 DIAGNOSIS — G43009 Migraine without aura, not intractable, without status migrainosus: Secondary | ICD-10-CM | POA: Diagnosis not present

## 2021-12-05 DIAGNOSIS — I10 Essential (primary) hypertension: Secondary | ICD-10-CM | POA: Diagnosis not present

## 2021-12-05 DIAGNOSIS — I25118 Atherosclerotic heart disease of native coronary artery with other forms of angina pectoris: Secondary | ICD-10-CM | POA: Diagnosis not present

## 2021-12-05 NOTE — Progress Notes (Signed)
Date:  12/05/2021   Name:  Nancy Skinner   DOB:  [birth year]   MRN:  [id number]   Chief Complaint: Hypertension  Hypertension This is a chronic problem. The problem is controlled. Associated symptoms include chest pain. Pertinent negatives include no headaches, palpitations or shortness of breath. Past treatments include beta blockers and diuretics. Hypertensive end-organ damage includes CAD/MI. There is no history of kidney disease.  Hyperlipidemia This is a chronic problem. The problem is controlled. Associated symptoms include chest pain. Pertinent negatives include no shortness of breath. Current antihyperlipidemic treatment includes statins. The current treatment provides significant improvement of lipids.  Depression        This is a chronic problem.The problem is unchanged.  Associated symptoms include no fatigue and no headaches.  Past treatments include SSRIs - Selective serotonin reuptake inhibitors and other medications.  Compliance with treatment is good. Migraine  This is a recurrent problem. Episode frequency: 1-2 per week. The problem has been unchanged. Associated symptoms include abdominal pain (gerd). Pertinent negatives include no coughing, dizziness or weakness. Her past medical history is significant for hypertension.   Lab Results  Component Value Date   NA 138 09/11/2020   K 3.4 (L) 09/11/2020   CO2 20 09/11/2020   GLUCOSE 93 09/11/2020   BUN 12 09/11/2020   CREATININE 0.78 09/11/2020   CALCIUM 9.1 09/11/2020   GFRNONAA 87 09/11/2020   Lab Results  Component Value Date   CHOL 130 09/02/2019   HDL 33 (L) 09/02/2019   LDLCALC 50 09/02/2019   LDLDIRECT 48 09/02/2019   TRIG 307 (H) 09/02/2019   CHOLHDL 3.9 09/02/2019   Lab Results  Component Value Date   TSH 4.070 09/11/2020   Lab Results  Component Value Date   HGBA1C 5.9 06/26/2018   Lab Results  Component Value Date   WBC 11.6 (H) 11/18/2020   HGB 13.5 11/18/2020   HCT 41.7 11/18/2020   MCV  86.0 11/18/2020   PLT 402 (H) 11/18/2020   Lab Results  Component Value Date   ALT 12 09/11/2020   AST 12 09/11/2020   ALKPHOS 83 09/11/2020   BILITOT <0.2 09/11/2020   No results found for: 25OHVITD2, 25OHVITD3, VD25OH   Review of Systems  Constitutional:  Negative for fatigue and unexpected weight change.  HENT:  Negative for nosebleeds.   Eyes:  Negative for visual disturbance.  Respiratory:  Negative for cough, chest tightness, shortness of breath and wheezing.   Cardiovascular:  Positive for chest pain. Negative for palpitations and leg swelling.  Gastrointestinal:  Positive for abdominal pain (gerd). Negative for constipation and diarrhea.  Neurological:  Negative for dizziness, weakness, light-headedness and headaches.  Psychiatric/Behavioral:  Positive for depression.    Patient Active Problem List   Diagnosis Date Noted   Essential hypertension 04/16/2021   Hepatic steatosis 03/19/2021   Carpal tunnel syndrome of right wrist 07/10/2020   Screening for colon cancer    Coronary artery disease of native artery of native heart with stable angina pectoris (HCC) 01/18/2019   Major depression single episode, in partial remission (HCC) 01/18/2019   Neutrophilic leukocytosis 08/09/2018   Pulmonary nodule less than 6 cm determined by computed tomography of lung 07/30/2018   Night sweats 07/16/2018   Neck muscle spasm 07/15/2018   Mixed hyperlipidemia 07/15/2018   Tobacco use disorder, mild, in early remission 07/15/2018   Migraine without aura and without status migrainosus, not intractable 06/24/2018   Gastroesophageal reflux disease without esophagitis 06/24/2018   Chronic midline  low back pain without sciatica 06/24/2018    Allergies  Allergen Reactions   Oxycodone Other (See Comments)    "Black outs" and confusion    Past Surgical History:  Procedure Laterality Date   ABDOMINAL HYSTERECTOMY  2012   partial for bleeding   BACK SURGERY     BREAST BIOPSY Right     neg   BREAST BIOPSY Left 2015   4 bxs- neg   BREAST SURGERY Left    multiple biopsies   CARDIAC CATHETERIZATION     CATARACT EXTRACTION Right 12/19/2010   CHOLECYSTECTOMY  1999   COLONOSCOPY WITH PROPOFOL N/A 03/31/2020   Procedure: COLONOSCOPY WITH PROPOFOL;  Surgeon: Lucilla Lame, MD;  Location: [facility];  Service: Endoscopy;  Laterality: N/A;   EYE SURGERY     INTRAVASCULAR PRESSURE WIRE/FFR STUDY N/A 07/22/2018   Procedure: INTRAVASCULAR PRESSURE WIRE/FFR STUDY;  Surgeon: Nelva Bush, MD;  Location: [place] [facility];  Service: Cardiovascular;  Laterality: N/A;   LEFT HEART CATH AND CORONARY ANGIOGRAPHY N/A 07/22/2018   Procedure: LEFT HEART CATH AND CORONARY ANGIOGRAPHY;  Surgeon: Nelva Bush, MD;  Location: [place] [facility];  Service: Cardiovascular;  Laterality: N/A;   POSTERIOR LUMBAR FUSION 4 LEVEL  2000    Social History   Tobacco Use   Smoking status: Former    Packs/day: 0.25    Years: 40.00    Pack years: 10.00    Types: Cigarettes    Quit date: 09/18/2019    Years since quitting: 2.2   Smokeless tobacco: Never  Vaping Use   Vaping Use: Never used  Substance Use Topics   Alcohol use: Yes    Comment: less than once a week.   Drug use: Never     Medication list has been reviewed and updated.  Current Meds  Medication Sig   aspirin EC 81 MG tablet Take 81 mg by mouth daily.   atorvastatin (LIPITOR) 40 MG tablet TAKE 1 TABLET BY MOUTH EVERY DAY   buPROPion (WELLBUTRIN XL) 300 MG 24 hr tablet TAKE 1 TABLET BY MOUTH EVERY DAY   clonazePAM (KLONOPIN) 0.25 MG disintegrating tablet TAKE 1 TABLET BY MOUTH 2 TIMES DAILY AS NEEDED.   clotrimazole-betamethasone (LOTRISONE) cream APPLY 1 APPLICATION TOPICALLY 2 (TWO) TIMES DAILY. TO RASH ON LEGS   cyclobenzaprine (FLEXERIL) 10 MG tablet TAKE 1 TABLET BY MOUTH THREE TIMES A DAY AS NEEDED FOR MUSCLE SPASMS   escitalopram (LEXAPRO) 20 MG tablet TAKE 1 TABLET BY MOUTH EVERY DAY   Esomeprazole Magnesium  (NEXIUM PO) Take 20 mg by mouth daily.    Galcanezumab-gnlm (EMGALITY) 120 MG/ML SOAJ Inject 120 mg into the skin every 30 (thirty) days.   hydrochlorothiazide (HYDRODIURIL) 25 MG tablet Take 1 tablet (25 mg total) by mouth daily.   Lidocaine 4 % PTCH Apply 1 patch topically daily as needed (pain).    metoprolol tartrate (LOPRESSOR) 100 MG tablet Take 1 tablet (100 mg total) by mouth once for 1 dose. Take TWO hours prior to CT procedure   Multiple Vitamin (MULTIVITAMIN) capsule Take 1 capsule by mouth daily.   SUMAtriptan (IMITREX) 100 MG tablet TAKE 1 TABLET (100 MG TOTAL) BY MOUTH AS NEEDED FOR MIGRAINE. MAY REPEAT IN 2 HOURS IF NEEDED   topiramate (TOPAMAX) 25 MG tablet TAKE 1-4 TABLETS (25-100 MG TOTAL) BY MOUTH AT BEDTIME.   triamcinolone (KENALOG) 0.025 % ointment Apply 1 application topically as needed. For rash   Ubrogepant (UBRELVY) 100 MG TABS Take 100 mg by mouth daily  as needed.    PHQ 2/9 Scores 12/05/2021 04/16/2021 03/14/2021 01/22/2021  PHQ - 2 Score 6 6 0 0  PHQ- 9 Score 18 18 0 0    GAD 7 : Generalized Anxiety Score 04/16/2021 03/14/2021 01/22/2021 09/11/2020  Nervous, Anxious, on Edge 3 0 0 3  Control/stop worrying 3 0 0 3  Worry too much - different things 3 0 0 3  Trouble relaxing 3 0 0 1  Restless 3 0 0 1  Easily annoyed or irritable 3 0 0 3  Afraid - awful might happen 0 0 0 3  Total GAD 7 Score 18 0 0 17  Anxiety Difficulty Somewhat difficult - - Somewhat difficult    BP Readings from Last 3 Encounters:  12/05/21 120/88  04/16/21 118/78  03/15/21 102/66    Physical Exam Vitals and nursing note reviewed.  Constitutional:      General: She is not in acute distress.    Appearance: She is well-developed.  HENT:     Head: Normocephalic and atraumatic.  Cardiovascular:     Rate and Rhythm: Normal rate and regular rhythm.     Pulses: Normal pulses.     Heart sounds: No murmur heard. Pulmonary:     Effort: Pulmonary effort is normal. No respiratory distress.      Breath sounds: No wheezing or rhonchi.  Musculoskeletal:     Right lower leg: No edema.     Left lower leg: No edema.  Skin:    General: Skin is warm and dry.     Capillary Refill: Capillary refill takes less than 2 seconds.     Findings: No rash.  Neurological:     General: No focal deficit present.     Mental Status: She is alert and oriented to person, place, and time.  Psychiatric:        Mood and Affect: Mood normal.        Behavior: Behavior normal.    Wt Readings from Last 3 Encounters:  12/05/21 196 lb (88.9 kg)  04/16/21 202 lb (91.6 kg)  03/14/21 216 lb (98 kg)    BP 120/88    Pulse 91    Ht 5\' 3"  (1.6 m)    Wt 196 lb (88.9 kg)    SpO2 97%    BMI 34.72 kg/m   Assessment and Plan: 1. Coronary artery disease of native artery of native heart with stable angina pectoris (HCC) Intermittent fleeting chest pains - recommend Cardiology follow up - Lipid panel  2. Essential hypertension Clinically stable exam with well controlled BP. Tolerating medications without side effects at this time. Pt to continue current regimen and low sodium diet; benefits of regular exercise as able discussed. - CBC with Differential/Platelet - Comprehensive metabolic panel - TSH  3. Migraine without aura and without status migrainosus, not intractable Clinically stable without change in character or frequency of migraine headaches Headaches respond well to current therapy with Topamax and Imitrex. Will continue current plan, follow up if worsening.  4. Mixed hyperlipidemia Tolerating statin medication without side effects at this time LDL is at goal of < 70 on current dose Continue same therapy without change at this time. - Lipid panel   Partially dictated using Editor, commissioning. Any errors are unintentional.  Halina Maidens, MD [facility]  12/05/2021

## 2021-12-05 NOTE — Assessment & Plan Note (Deleted)
Onset 2019; controlled on beta blocker and HCTZ

## 2021-12-06 LAB — CBC WITH DIFFERENTIAL/PLATELET
Basophils Absolute: 0 10*3/uL (ref 0.0–0.2)
Basos: 0 %
EOS (ABSOLUTE): 0.3 10*3/uL (ref 0.0–0.4)
Eos: 2 %
Hematocrit: 42.6 % (ref 34.0–46.6)
Hemoglobin: 14.5 g/dL (ref 11.1–15.9)
Immature Grans (Abs): 0.1 10*3/uL (ref 0.0–0.1)
Immature Granulocytes: 1 %
Lymphocytes Absolute: 5.8 10*3/uL — ABNORMAL HIGH (ref 0.7–3.1)
Lymphs: 44 %
MCH: 28.9 pg (ref 26.6–33.0)
MCHC: 34 g/dL (ref 31.5–35.7)
MCV: 85 fL (ref 79–97)
Monocytes Absolute: 0.6 10*3/uL (ref 0.1–0.9)
Monocytes: 4 %
Neutrophils Absolute: 6.4 10*3/uL (ref 1.4–7.0)
Neutrophils: 49 %
Platelets: 370 10*3/uL (ref 150–450)
RBC: 5.01 x10E6/uL (ref 3.77–5.28)
RDW: 15.4 % (ref 11.7–15.4)
WBC: 13.1 10*3/uL — ABNORMAL HIGH (ref 3.4–10.8)

## 2021-12-06 LAB — COMPREHENSIVE METABOLIC PANEL
ALT: 33 IU/L — ABNORMAL HIGH (ref 0–32)
AST: 41 IU/L — ABNORMAL HIGH (ref 0–40)
Albumin/Globulin Ratio: 1.6 (ref 1.2–2.2)
Albumin: 4.4 g/dL (ref 3.8–4.9)
Alkaline Phosphatase: 124 IU/L — ABNORMAL HIGH (ref 44–121)
BUN/Creatinine Ratio: 18 (ref 9–23)
BUN: 15 mg/dL (ref 6–24)
Bilirubin Total: <0.2 mg/dL (ref 0.0–1.2)
CO2: 24 mmol/L (ref 20–29)
Calcium: 9.8 mg/dL (ref 8.7–10.2)
Chloride: 94 mmol/L — ABNORMAL LOW (ref 96–106)
Creatinine, Ser: 0.84 mg/dL (ref 0.57–1.00)
Globulin, Total: 2.7 g/dL (ref 1.5–4.5)
Glucose: 128 mg/dL — ABNORMAL HIGH (ref 70–99)
Potassium: 3.7 mmol/L (ref 3.5–5.2)
Sodium: 140 mmol/L (ref 134–144)
Total Protein: 7.1 g/dL (ref 6.0–8.5)
eGFR: 82 mL/min/{1.73_m2} (ref >59)

## 2021-12-06 LAB — LIPID PANEL
Chol/HDL Ratio: 4.4 ratio (ref 0.0–4.4)
Cholesterol, Total: 166 mg/dL (ref 100–199)
HDL: 38 mg/dL — ABNORMAL LOW (ref >39)
LDL Chol Calc (NIH): 74 mg/dL (ref 0–99)
Triglycerides: 337 mg/dL — ABNORMAL HIGH (ref 0–149)
VLDL Cholesterol Cal: 54 mg/dL — ABNORMAL HIGH (ref 5–40)

## 2021-12-06 LAB — TSH: TSH: 4.04 u[IU]/mL (ref 0.450–4.500)

## 2022-01-11 ENCOUNTER — Other Ambulatory Visit: Payer: Self-pay | Admitting: Internal Medicine

## 2022-01-11 DIAGNOSIS — M545 Low back pain, unspecified: Secondary | ICD-10-CM

## 2022-01-11 DIAGNOSIS — G8929 Other chronic pain: Secondary | ICD-10-CM

## 2022-01-11 NOTE — Telephone Encounter (Signed)
Requested medication (s) are due for refill today: Yes  Requested medication (s) are on the active medication list: Yes  Last refill:  10/23/21  Future visit scheduled: Yes  Notes to clinic:  See request.    Requested Prescriptions  Pending Prescriptions Disp Refills   cyclobenzaprine (FLEXERIL) 10 MG tablet [Pharmacy Med Name: CYCLOBENZAPRINE 10 MG TABLET] 90 tablet 0    Sig: TAKE 1 TABLET BY MOUTH THREE TIMES A DAY AS NEEDED FOR MUSCLE SPASMS     Not Delegated - Analgesics:  Muscle Relaxants Failed - 01/11/2022  1:55 PM      Failed - This refill cannot be delegated      Passed - Valid encounter within last 6 months    Recent Outpatient Visits           1 month ago Migraine without aura and without status migrainosus, not intractable   [facility] Glean Hess, MD   9 months ago Essential hypertension   [facility] Glean Hess, MD   10 months ago Bronchospasm with bronchitis, acute   [facility] Glean Hess, MD   11 months ago Migraine without aura and without status migrainosus, not intractable   [facility] Glean Hess, MD   1 year ago Migraine without aura and without status migrainosus, not intractable   [facility] Glean Hess, MD       Future Appointments             In 5 days Glean Hess, MD [facility], [facility]

## 2022-01-16 ENCOUNTER — Encounter: Payer: Self-pay | Admitting: Internal Medicine

## 2022-01-16 ENCOUNTER — Ambulatory Visit (INDEPENDENT_AMBULATORY_CARE_PROVIDER_SITE_OTHER): Payer: 59 | Admitting: Internal Medicine

## 2022-01-16 ENCOUNTER — Other Ambulatory Visit: Payer: Self-pay

## 2022-01-16 VITALS — BP 104/76 | HR 76 | Ht 63.0 in | Wt 191.0 lb

## 2022-01-16 DIAGNOSIS — F17201 Nicotine dependence, unspecified, in remission: Secondary | ICD-10-CM | POA: Diagnosis not present

## 2022-01-16 DIAGNOSIS — I10 Essential (primary) hypertension: Secondary | ICD-10-CM

## 2022-01-16 DIAGNOSIS — G43009 Migraine without aura, not intractable, without status migrainosus: Secondary | ICD-10-CM | POA: Diagnosis not present

## 2022-01-16 DIAGNOSIS — E782 Mixed hyperlipidemia: Secondary | ICD-10-CM | POA: Diagnosis not present

## 2022-01-16 DIAGNOSIS — Z1159 Encounter for screening for other viral diseases: Secondary | ICD-10-CM | POA: Diagnosis not present

## 2022-01-16 DIAGNOSIS — Z1231 Encounter for screening mammogram for malignant neoplasm of breast: Secondary | ICD-10-CM

## 2022-01-16 DIAGNOSIS — Z0000 Encounter for general adult medical examination without abnormal findings: Secondary | ICD-10-CM

## 2022-01-16 DIAGNOSIS — I25118 Atherosclerotic heart disease of native coronary artery with other forms of angina pectoris: Secondary | ICD-10-CM

## 2022-01-16 NOTE — Progress Notes (Signed)
Date:  01/16/2022   Name:  Lillyanna Glandon   DOB:  01-May-1966   MRN:  [id number]   Chief Complaint: No chief complaint on file. Cela Covington is a 56 y.o. female who presents today for her Complete Annual Exam. She feels well. She reports exercising none. She reports she is sleeping well. Breast complaints none. Going to a weight loss clinic and getting Ozempic weekly.  She has lost 9 lbs.  Her grandson is better on meds and with a change of school so she is less stressed than previously.  Mammogram: 06/2018 DEXA: none Pap smear: discontinued Colonoscopy: 03/2020 repeat 10 yrs  Immunization History  Administered Date(s) Administered   Moderna Sars-Covid-2 Vaccination 09/29/2020, 10/27/2020    Hypertension This is a chronic problem. The problem is controlled. Pertinent negatives include no chest pain, headaches, palpitations or shortness of breath. Past treatments include beta blockers and diuretics. The current treatment provides significant improvement. Hypertensive end-organ damage includes CAD/MI.  Hyperlipidemia This is a chronic problem. The problem is controlled. Pertinent negatives include no chest pain or shortness of breath. Current antihyperlipidemic treatment includes statins. The current treatment provides significant improvement of lipids.  Migraine  This is a recurrent problem. Pertinent negatives include no abdominal pain, coughing, dizziness, fever, hearing loss, tinnitus or vomiting. Her past medical history is significant for hypertension.  Gastroesophageal Reflux She complains of heartburn. She reports no abdominal pain, no chest pain, no coughing or no wheezing. This is a recurrent problem. Pertinent negatives include no fatigue. She has tried a PPI for the symptoms. The treatment provided significant relief.  Depression        This is a chronic problem.The problem is unchanged.  Associated symptoms include no fatigue and no headaches.  Past treatments include SSRIs -  Selective serotonin reuptake inhibitors and other medications.  Compliance with treatment is good.  Lab Results  Component Value Date   NA 140 12/05/2021   K 3.7 12/05/2021   CO2 24 12/05/2021   GLUCOSE 128 (H) 12/05/2021   BUN 15 12/05/2021   CREATININE 0.84 12/05/2021   CALCIUM 9.8 12/05/2021   EGFR 82 12/05/2021   GFRNONAA 87 09/11/2020   Lab Results  Component Value Date   CHOL 166 12/05/2021   HDL 38 (L) 12/05/2021   LDLCALC 74 12/05/2021   LDLDIRECT 48 09/02/2019   TRIG 337 (H) 12/05/2021   CHOLHDL 4.4 12/05/2021   Lab Results  Component Value Date   TSH 4.040 12/05/2021   Lab Results  Component Value Date   HGBA1C 5.9 06/26/2018   Lab Results  Component Value Date   WBC 13.1 (H) 12/05/2021   HGB 14.5 12/05/2021   HCT 42.6 12/05/2021   MCV 85 12/05/2021   PLT 370 12/05/2021   Lab Results  Component Value Date   ALT 33 (H) 12/05/2021   AST 41 (H) 12/05/2021   ALKPHOS 124 (H) 12/05/2021   BILITOT <0.2 12/05/2021   No results found for: 25OHVITD2, 25OHVITD3, VD25OH   Review of Systems  Constitutional:  Negative for chills, fatigue and fever.  HENT:  Negative for congestion, hearing loss, tinnitus, trouble swallowing and voice change.   Eyes:  Negative for visual disturbance.  Respiratory:  Negative for cough, chest tightness, shortness of breath and wheezing.   Cardiovascular:  Negative for chest pain, palpitations and leg swelling.  Gastrointestinal:  Positive for heartburn. Negative for abdominal pain, constipation, diarrhea and vomiting.  Endocrine: Negative for polydipsia and polyuria.  Genitourinary:  Negative for  dysuria, frequency, genital sores, vaginal bleeding and vaginal discharge.  Musculoskeletal:  Negative for arthralgias, gait problem and joint swelling.  Skin:  Negative for color change and rash.  Neurological:  Negative for dizziness, tremors, light-headedness and headaches.  Hematological:  Negative for adenopathy. Does not bruise/bleed  easily.  Psychiatric/Behavioral:  Positive for depression. Negative for dysphoric mood and sleep disturbance. The patient is not nervous/anxious.    Patient Active Problem List   Diagnosis Date Noted   Essential hypertension 04/16/2021   Hepatic steatosis 03/19/2021   Carpal tunnel syndrome of right wrist 07/10/2020   Screening for colon cancer    Coronary artery disease of native artery of native heart with stable angina pectoris ([place]) 06/08/7627   Neutrophilic leukocytosis 31/51/7616   Pulmonary nodule less than 6 cm determined by computed tomography of lung 07/30/2018   Night sweats 07/16/2018   Neck muscle spasm 07/15/2018   Mixed hyperlipidemia 07/15/2018   Tobacco use disorder, moderate, in sustained remission 07/15/2018   Migraine without aura and without status migrainosus, not intractable 06/24/2018   Gastroesophageal reflux disease without esophagitis 06/24/2018   Chronic midline low back pain without sciatica 06/24/2018    Allergies  Allergen Reactions   Oxycodone Other (See Comments)    "Black outs" and confusion    Past Surgical History:  Procedure Laterality Date   ABDOMINAL HYSTERECTOMY  2012   partial for bleeding   BACK SURGERY     BREAST BIOPSY Right    neg   BREAST BIOPSY Left 2015   4 bxs- neg   BREAST SURGERY Left    multiple biopsies   CARDIAC CATHETERIZATION     CATARACT EXTRACTION Right 12/19/2010   CHOLECYSTECTOMY  1999   COLONOSCOPY WITH PROPOFOL N/A 03/31/2020   Procedure: COLONOSCOPY WITH PROPOFOL;  Surgeon: Lucilla Lame, MD;  Location: [facility];  Service: Endoscopy;  Laterality: N/A;   EYE SURGERY     INTRAVASCULAR PRESSURE WIRE/FFR STUDY N/A 07/22/2018   Procedure: INTRAVASCULAR PRESSURE WIRE/FFR STUDY;  Surgeon: Nelva Bush, MD;  Location: [place] [facility];  Service: Cardiovascular;  Laterality: N/A;   LEFT HEART CATH AND CORONARY ANGIOGRAPHY N/A 07/22/2018   Procedure: LEFT HEART CATH AND CORONARY ANGIOGRAPHY;  Surgeon: Nelva Bush, MD;  Location: [place] [facility];  Service: Cardiovascular;  Laterality: N/A;   POSTERIOR LUMBAR FUSION 4 LEVEL  2000    Social History   Tobacco Use   Smoking status: Former    Packs/day: 0.25    Years: 40.00    Pack years: 10.00    Types: Cigarettes    Quit date: 09/18/2019    Years since quitting: 2.3   Smokeless tobacco: Never  Vaping Use   Vaping Use: Never used  Substance Use Topics   Alcohol use: Yes    Comment: less than once a week.   Drug use: Never     Medication list has been reviewed and updated.  Current Meds  Medication Sig   aspirin EC 81 MG tablet Take 81 mg by mouth daily.   atorvastatin (LIPITOR) 40 MG tablet TAKE 1 TABLET BY MOUTH EVERY DAY   buPROPion (WELLBUTRIN XL) 300 MG 24 hr tablet TAKE 1 TABLET BY MOUTH EVERY DAY   clonazePAM (KLONOPIN) 0.25 MG disintegrating tablet TAKE 1 TABLET BY MOUTH 2 TIMES DAILY AS NEEDED.   clotrimazole-betamethasone (LOTRISONE) cream APPLY 1 APPLICATION TOPICALLY 2 (TWO) TIMES DAILY. TO RASH ON LEGS   cyclobenzaprine (FLEXERIL) 10 MG tablet TAKE 1 TABLET BY MOUTH THREE TIMES  A DAY AS NEEDED FOR MUSCLE SPASMS   escitalopram (LEXAPRO) 20 MG tablet TAKE 1 TABLET BY MOUTH EVERY DAY   Esomeprazole Magnesium (NEXIUM PO) Take 20 mg by mouth daily.    hydrochlorothiazide (HYDRODIURIL) 25 MG tablet Take 1 tablet (25 mg total) by mouth daily.   Lidocaine 4 % PTCH Apply 1 patch topically daily as needed (pain).    Multiple Vitamin (MULTIVITAMIN) capsule Take 1 capsule by mouth daily.   nitroGLYCERIN (NITROSTAT) 0.4 MG SL tablet Place 1 tablet (0.4 mg total) under the tongue every 5 (five) minutes as needed for chest pain.   Semaglutide (OZEMPIC, 1 MG/DOSE, [place]) Inject 1 mg into the skin once a week.   SUMAtriptan (IMITREX) 100 MG tablet TAKE 1 TABLET (100 MG TOTAL) BY MOUTH AS NEEDED FOR MIGRAINE. MAY REPEAT IN 2 HOURS IF NEEDED   topiramate (TOPAMAX) 25 MG tablet TAKE 1-4 TABLETS (25-100 MG TOTAL) BY MOUTH AT BEDTIME.    triamcinolone (KENALOG) 0.025 % ointment Apply 1 application topically as needed. For rash   [DISCONTINUED] metoprolol tartrate (LOPRESSOR) 100 MG tablet Take 1 tablet (100 mg total) by mouth once for 1 dose. Take TWO hours prior to CT procedure    PHQ 2/9 Scores 01/16/2022 12/05/2021 04/16/2021 03/14/2021  PHQ - 2 Score 6 6 6  0  PHQ- 9 Score 18 18 18  0    GAD 7 : Generalized Anxiety Score 01/16/2022 12/05/2021 04/16/2021 03/14/2021  Nervous, Anxious, on Edge 3 3 3  0  Control/stop worrying 3 3 3  0  Worry too much - different things 3 3 3  0  Trouble relaxing 3 3 3  0  Restless 3 3 3  0  Easily annoyed or irritable 3 3 3  0  Afraid - awful might happen 1 1 0 0  Total GAD 7 Score 19 19 18  0  Anxiety Difficulty - - Somewhat difficult -    BP Readings from Last 3 Encounters:  01/16/22 104/76  12/05/21 120/88  04/16/21 118/78    Physical Exam Vitals and nursing note reviewed.  Constitutional:      General: She is not in acute distress.    Appearance: She is well-developed.  HENT:     Head: Normocephalic and atraumatic.     Right Ear: Tympanic membrane and ear canal normal.     Left Ear: Tympanic membrane and ear canal normal.     Nose:     Right Sinus: No maxillary sinus tenderness.     Left Sinus: No maxillary sinus tenderness.  Eyes:     General: No scleral icterus.       Right eye: No discharge.        Left eye: No discharge.     Conjunctiva/sclera: Conjunctivae normal.  Neck:     Thyroid: No thyromegaly.     Vascular: No carotid bruit.  Cardiovascular:     Rate and Rhythm: Normal rate and regular rhythm.     Pulses: Normal pulses.     Heart sounds: Normal heart sounds.  Pulmonary:     Effort: Pulmonary effort is normal. No respiratory distress.     Breath sounds: No wheezing.  Chest:  Breasts:    Right: No mass, nipple discharge, skin change or tenderness.     Left: No mass, nipple discharge, skin change or tenderness.  Abdominal:     General: Bowel sounds are normal.      Palpations: Abdomen is soft.     Tenderness: There is no abdominal tenderness.  Musculoskeletal:  Cervical back: Normal range of motion. No erythema.     Right lower leg: No edema.     Left lower leg: No edema.  Lymphadenopathy:     Cervical: No cervical adenopathy.  Skin:    General: Skin is warm and dry.     Capillary Refill: Capillary refill takes less than 2 seconds.     Findings: No rash.  Neurological:     General: No focal deficit present.     Mental Status: She is alert and oriented to person, place, and time.     Cranial Nerves: No cranial nerve deficit.     Sensory: No sensory deficit.     Deep Tendon Reflexes: Reflexes are normal and symmetric.  Psychiatric:        Attention and Perception: Attention normal.        Mood and Affect: Mood normal.    Wt Readings from Last 3 Encounters:  01/16/22 191 lb (86.6 kg)  12/05/21 196 lb (88.9 kg)  04/16/21 202 lb (91.6 kg)    BP 104/76    Pulse 76    Ht 5' 3"  (1.6 m)    Wt 191 lb (86.6 kg)    SpO2 95%    BMI 33.83 kg/m   Assessment and Plan: 1. Annual physical exam Exam is normal except for weight. Encourage regular exercise and appropriate dietary changes. Discussed Ozempic for weight loss off label.  Will check A1C. - Hemoglobin A1c  2. Encounter for screening mammogram for breast cancer Schedule at [facility] - MM 3D SCREEN BREAST BILATERAL  3. Need for hepatitis C screening test - Hepatitis C antibody  4. Essential hypertension Clinically stable exam with well controlled BP. Tolerating medications without side effects at this time. Pt to continue current regimen and low sodium diet; benefits of regular exercise as able discussed. No chest pains or SOB.  WBC and liver functions were elevated last visit - will recheck. - CBC with Differential/Platelet - Comprehensive metabolic panel  5. Mixed hyperlipidemia Controlled.  6. Migraine without aura and without status migrainosus, not intractable Stable  recurrent with out change in character or frequency. Continue Topamax and PRN Imitrex  7. Tobacco use disorder, moderate, in sustained remission Does not qualify for LDCT scan screening  8. Coronary artery disease of native artery of native heart with stable angina pectoris ([address]) Stable without recent chest pain Follow up with cardiology in the near future   Partially dictated using Rock. Any errors are unintentional.  Halina Maidens, MD [facility]  01/16/2022

## 2022-01-19 LAB — COMPREHENSIVE METABOLIC PANEL
ALT: 42 IU/L — ABNORMAL HIGH (ref 0–32)
AST: 49 IU/L — ABNORMAL HIGH (ref 0–40)
Albumin/Globulin Ratio: 1.5 (ref 1.2–2.2)
Albumin: 4.1 g/dL (ref 3.8–4.9)
Alkaline Phosphatase: 119 IU/L (ref 44–121)
BUN/Creatinine Ratio: 12 (ref 9–23)
BUN: 12 mg/dL (ref 6–24)
Bilirubin Total: 0.3 mg/dL (ref 0.0–1.2)
CO2: 29 mmol/L (ref 20–29)
Calcium: 9.5 mg/dL (ref 8.7–10.2)
Chloride: 91 mmol/L — ABNORMAL LOW (ref 96–106)
Creatinine, Ser: 0.98 mg/dL (ref 0.57–1.00)
Globulin, Total: 2.8 g/dL (ref 1.5–4.5)
Glucose: 112 mg/dL — ABNORMAL HIGH (ref 70–99)
Potassium: 2.9 mmol/L — ABNORMAL LOW (ref 3.5–5.2)
Sodium: 141 mmol/L (ref 134–144)
Total Protein: 6.9 g/dL (ref 6.0–8.5)
eGFR: 68 mL/min/{1.73_m2} (ref >59)

## 2022-01-19 LAB — CBC WITH DIFFERENTIAL/PLATELET
Basophils Absolute: 0.1 10*3/uL (ref 0.0–0.2)
Basos: 1 %
EOS (ABSOLUTE): 0.3 10*3/uL (ref 0.0–0.4)
Eos: 2 %
Hematocrit: 39.7 % (ref 34.0–46.6)
Hemoglobin: 14 g/dL (ref 11.1–15.9)
Immature Grans (Abs): 0.1 10*3/uL (ref 0.0–0.1)
Immature Granulocytes: 1 %
Lymphocytes Absolute: 5.1 10*3/uL — ABNORMAL HIGH (ref 0.7–3.1)
Lymphs: 42 %
MCH: 29.4 pg (ref 26.6–33.0)
MCHC: 35.3 g/dL (ref 31.5–35.7)
MCV: 83 fL (ref 79–97)
Monocytes Absolute: 0.6 10*3/uL (ref 0.1–0.9)
Monocytes: 5 %
Neutrophils Absolute: 6.1 10*3/uL (ref 1.4–7.0)
Neutrophils: 49 %
Platelets: 388 10*3/uL (ref 150–450)
RBC: 4.77 x10E6/uL (ref 3.77–5.28)
RDW: 14.3 % (ref 11.7–15.4)
WBC: 12.2 10*3/uL — ABNORMAL HIGH (ref 3.4–10.8)

## 2022-01-19 LAB — HEMOGLOBIN A1C
Est. average glucose Bld gHb Est-mCnc: 183 mg/dL
Hgb A1c MFr Bld: 8 % — ABNORMAL HIGH (ref 4.8–5.6)

## 2022-01-19 LAB — HEPATITIS C ANTIBODY: Hep C Virus Ab: <0.1 s/co ratio (ref 0.0–0.9)

## 2022-01-21 ENCOUNTER — Other Ambulatory Visit: Payer: Self-pay

## 2022-01-21 MED ORDER — METFORMIN HCL 500 MG PO TABS: 500.0000 mg | ORAL_TABLET | Freq: Two times a day (BID) | ORAL | 0 refills | Status: DC

## 2022-01-31 ENCOUNTER — Telehealth: Payer: Self-pay

## 2022-01-31 NOTE — Telephone Encounter (Signed)
Called pt as a reminder to call and schedule mammogram. Pt verbalized understanding.  KP 

## 2022-02-05 ENCOUNTER — Other Ambulatory Visit: Payer: Self-pay | Admitting: Internal Medicine

## 2022-02-05 DIAGNOSIS — F324 Major depressive disorder, single episode, in partial remission: Secondary | ICD-10-CM

## 2022-02-05 DIAGNOSIS — I10 Essential (primary) hypertension: Secondary | ICD-10-CM

## 2022-02-05 NOTE — Telephone Encounter (Signed)
Per recent annual exam visit- medications and labs viewed by provider. Advised continue current medications Requested Prescriptions  Pending Prescriptions Disp Refills   hydrochlorothiazide (HYDRODIURIL) 25 MG tablet [Pharmacy Med Name: HYDROCHLOROTHIAZIDE 25 MG TAB] 90 tablet 3    Sig: TAKE 1 TABLET (25 MG TOTAL) BY MOUTH DAILY.     Cardiovascular: Diuretics - Thiazide Failed - 02/05/2022  1:46 AM      Failed - K in normal range and within 180 days    Potassium  Date Value Ref Range Status  01/18/2022 2.9 (L) 3.5 - 5.2 mmol/L Final         Passed - Cr in normal range and within 180 days    Creatinine, Ser  Date Value Ref Range Status  01/18/2022 0.98 0.57 - 1.00 mg/dL Final         Passed - Na in normal range and within 180 days    Sodium  Date Value Ref Range Status  01/18/2022 141 134 - 144 mmol/L Final         Passed - Last BP in normal range    BP Readings from Last 1 Encounters:  01/16/22 104/76         Passed - Valid encounter within last 6 months    Recent Outpatient Visits          2 weeks ago Annual physical exam   [facility] Reubin Milan, MD   2 months ago Migraine without aura and without status migrainosus, not intractable   [facility] Reubin Milan, MD   9 months ago Essential hypertension   [facility] Reubin Milan, MD   10 months ago Bronchospasm with bronchitis, acute   [facility] Reubin Milan, MD   1 year ago Migraine without aura and without status migrainosus, not intractable   [facility] Reubin Milan, MD      Future Appointments            In 5 months Reubin Milan, MD [facility]   In 11 months Reubin Milan, MD [facility]            buPROPion (WELLBUTRIN XL) 300 MG 24 hr tablet [Pharmacy Med Name: BUPROPION HCL XL 300 MG TABLET] 90 tablet 0    Sig: TAKE 1 TABLET BY MOUTH EVERY DAY     Psychiatry: Antidepressants -  bupropion Failed - 02/05/2022  1:46 AM      Failed - AST in normal range and within 360 days    AST  Date Value Ref Range Status  01/18/2022 49 (H) 0 - 40 IU/L Final         Failed - ALT in normal range and within 360 days    ALT  Date Value Ref Range Status  01/18/2022 42 (H) 0 - 32 IU/L Final         Passed - Cr in normal range and within 360 days    Creatinine, Ser  Date Value Ref Range Status  01/18/2022 0.98 0.57 - 1.00 mg/dL Final         Passed - Last BP in normal range    BP Readings from Last 1 Encounters:  01/16/22 104/76         Passed - Valid encounter within last 6 months    Recent Outpatient Visits          2 weeks ago Annual physical exam   [facility] Reubin Milan, MD   2 months ago Migraine without aura and without status migrainosus, not intractable   [facility] Reubin Milan, MD   9 months ago Essential hypertension   [facility] Reubin Milan, MD   10 months ago Bronchospasm with bronchitis, acute   [facility] Reubin Milan, MD   1 year ago Migraine without aura and without status migrainosus, not intractable   [facility] Reubin Milan, MD      Future Appointments            In 5 months Judithann Graves Nyoka Cowden, MD [facility]   In 11 months Judithann Graves Nyoka Cowden, MD [facility], [facility]

## 2022-02-17 ENCOUNTER — Other Ambulatory Visit: Payer: Self-pay | Admitting: Internal Medicine

## 2022-02-17 DIAGNOSIS — F321 Major depressive disorder, single episode, moderate: Secondary | ICD-10-CM

## 2022-02-19 NOTE — Telephone Encounter (Signed)
Requested Prescriptions  ?Pending Prescriptions Disp Refills  ?? escitalopram (LEXAPRO) 20 MG tablet [Pharmacy Med Name: ESCITALOPRAM 20 MG TABLET] 90 tablet 1  ?  Sig: TAKE 1 TABLET BY MOUTH EVERY DAY  ?  ? Psychiatry:  Antidepressants - SSRI Passed - 02/17/2022 11:08 AM  ?  ?  Passed - Valid encounter within last 6 months  ?  Recent Outpatient Visits   ?      ? 1 month ago Annual physical exam  ? [facility] Glean Hess, MD  ? 2 months ago Migraine without aura and without status migrainosus, not intractable  ? [facility] Glean Hess, MD  ? 10 months ago Essential hypertension  ? [facility] Glean Hess, MD  ? 11 months ago Bronchospasm with bronchitis, acute  ? [facility] Glean Hess, MD  ? 1 year ago Migraine without aura and without status migrainosus, not intractable  ? [facility] Glean Hess, MD  ?  ?  ?Future Appointments   ?        ? In 5 months Army Melia Jesse Sans, MD [facility], [place]  ? In 11 months Glean Hess, MD [facility], [place]  ?  ? ?  ?  ?  ? ?

## 2022-02-28 ENCOUNTER — Other Ambulatory Visit: Payer: Self-pay

## 2022-02-28 ENCOUNTER — Ambulatory Visit
Admission: EM | Admit: 2022-02-28 | Discharge: 2022-02-28 | Disposition: A | Discharge status: Home / Self Care | Payer: 59 | Attending: Emergency Medicine | Admitting: Emergency Medicine

## 2022-02-28 DIAGNOSIS — M436 Torticollis: Secondary | ICD-10-CM | POA: Diagnosis not present

## 2022-02-28 MED ORDER — DIAZEPAM 5 MG PO TABS: 5.0000 mg | ORAL_TABLET | Freq: Every evening | ORAL | 0 refills | Status: AC

## 2022-02-28 MED ORDER — ONDANSETRON 8 MG PO TBDP: 8.0000 mg | ORAL_TABLET | Freq: Three times a day (TID) | ORAL | 0 refills | Status: DC | PRN

## 2022-02-28 MED ORDER — BACLOFEN 10 MG PO TABS: 10.0000 mg | ORAL_TABLET | Freq: Three times a day (TID) | ORAL | 0 refills | Status: AC

## 2022-02-28 MED ORDER — IBUPROFEN 600 MG PO TABS: 600.0000 mg | ORAL_TABLET | Freq: Four times a day (QID) | ORAL | 0 refills | Status: AC | PRN

## 2022-02-28 NOTE — ED Provider Notes (Signed)
?[place] ? ? ? ?CSN: [id number] ?Arrival date & time: 02/28/22  1011 ? ? ?  ? ?History   ?Chief Complaint ?Chief Complaint  ?Patient presents with  ? Nausea  ? Torticollis  ? ? ?HPI ?Nancy Skinner is a 56 y.o. female.  ? ?HPI ? ?56 year old female here for evaluation of neck stiffness and pain. ? ?Patient reports that her symptoms started yesterday while she was sitting in a chair.  She denies any injury or fall.  She denies any numbness, tingling, or weakness in her upper extremities.  She does not have any history of neck injury and she does not remember any particular activity that precipitated the stiffness.  She reports that she has significant tightness on the right and she cannot turn her head to the right but she cannot turn her head to the left.  Flexion extension of her neck are also limited secondary to pain. ? ?Past Medical History:  ?Diagnosis Date  ? Coronary artery disease, non-occlusive   ? a. LHC 07/22/18: LM nl, mLAD 60% w/ FFR 0.83, LCx nl, RCA nl, EF 55-65%, nl LVEDP, no AS.  ? Depression   ? Lumbar disc disease   ? Migraines   ? Mixed hyperlipidemia   ? ? ?Patient Active Problem List  ? Diagnosis Date Noted  ? Essential hypertension 04/16/2021  ? Hepatic steatosis 03/19/2021  ? Carpal tunnel syndrome of right wrist 07/10/2020  ? Screening for colon cancer   ? Coronary artery disease of native artery of native heart with stable angina pectoris ([place]) 01/18/2019  ? Neutrophilic leukocytosis [id number]  ? Pulmonary nodule less than 6 cm determined by computed tomography of lung 07/30/2018  ? Night sweats 07/16/2018  ? Neck muscle spasm 07/15/2018  ? Mixed hyperlipidemia 07/15/2018  ? Tobacco use disorder, moderate, in sustained remission 07/15/2018  ? Migraine without aura and without status migrainosus, not intractable 06/24/2018  ? Gastroesophageal reflux disease without esophagitis 06/24/2018  ? Chronic midline low back pain without sciatica 06/24/2018  ? ? ?Past Surgical History:   ?Procedure Laterality Date  ? ABDOMINAL HYSTERECTOMY  2012  ? partial for bleeding  ? BACK SURGERY    ? BREAST BIOPSY Right   ? neg  ? BREAST BIOPSY Left 2015  ? 4 bxs- neg  ? BREAST SURGERY Left   ? multiple biopsies  ? CARDIAC CATHETERIZATION    ? CATARACT EXTRACTION Right 12/19/2010  ? CHOLECYSTECTOMY  1999  ? COLONOSCOPY WITH PROPOFOL N/A 03/31/2020  ? Procedure: COLONOSCOPY WITH PROPOFOL;  Surgeon: Lucilla Lame, MD;  Location: [facility] ENDOSCOPY;  Service: Endoscopy;  Laterality: N/A;  ? EYE SURGERY    ? INTRAVASCULAR PRESSURE WIRE/FFR STUDY N/A 07/22/2018  ? Procedure: INTRAVASCULAR PRESSURE WIRE/FFR STUDY;  Surgeon: Nelva Bush, MD;  Location: [place] CV [facility];  Service: Cardiovascular;  Laterality: N/A;  ? LEFT HEART CATH AND CORONARY ANGIOGRAPHY N/A 07/22/2018  ? Procedure: LEFT HEART CATH AND CORONARY ANGIOGRAPHY;  Surgeon: Nelva Bush, MD;  Location: [place] [facility];  Service: Cardiovascular;  Laterality: N/A;  ? POSTERIOR LUMBAR FUSION 4 LEVEL  2000  ? ? ?OB History   ?No obstetric history on file. ?  ? ? ? ?Home Medications   ? ?Prior to Admission medications   ?Medication Sig Start Date End Date Taking? Authorizing Provider  ?baclofen (LIORESAL) 10 MG tablet Take 1 tablet (10 mg total) by mouth 3 (three) times daily. 02/28/22  Yes Margarette Canada, NP  ?diazepam (VALIUM) 5 MG tablet Take 1  tablet (5 mg total) by mouth at bedtime for 5 days. 02/28/22 03/05/22 Yes Margarette Canada, NP  ?ibuprofen (ADVIL) 600 MG tablet Take 1 tablet (600 mg total) by mouth every 6 (six) hours as needed. 02/28/22  Yes Margarette Canada, NP  ?aspirin EC 81 MG tablet Take 81 mg by mouth daily.    [provider]  ?atorvastatin (LIPITOR) 40 MG tablet TAKE 1 TABLET BY MOUTH EVERY DAY 10/23/21   Glean Hess, MD  ?buPROPion (WELLBUTRIN XL) 300 MG 24 hr tablet TAKE 1 TABLET BY MOUTH EVERY DAY 02/05/22   Glean Hess, MD  ?clonazePAM (KLONOPIN) 0.25 MG disintegrating tablet TAKE 1 TABLET BY MOUTH 2 TIMES DAILY AS  NEEDED. 11/26/21   Glean Hess, MD  ?clotrimazole-betamethasone (LOTRISONE) cream APPLY 1 APPLICATION TOPICALLY 2 (TWO) TIMES DAILY. TO RASH ON LEGS 10/23/21   Glean Hess, MD  ?escitalopram (LEXAPRO) 20 MG tablet TAKE 1 TABLET BY MOUTH EVERY DAY 02/19/22   Glean Hess, MD  ?Esomeprazole Magnesium (NEXIUM PO) Take 20 mg by mouth daily.     [provider]  ?hydrochlorothiazide (HYDRODIURIL) 25 MG tablet TAKE 1 TABLET (25 MG TOTAL) BY MOUTH DAILY. 02/05/22   Glean Hess, MD  ?Lidocaine 4 % PTCH Apply 1 patch topically daily as needed (pain).     [provider]  ?metFORMIN (GLUCOPHAGE) 500 MG tablet Take 1 tablet (500 mg total) by mouth 2 (two) times daily with a meal. 01/21/22   Glean Hess, MD  ?Multiple Vitamin (MULTIVITAMIN) capsule Take 1 capsule by mouth daily.    [provider]  ?nitroGLYCERIN (NITROSTAT) 0.4 MG SL tablet Place 1 tablet (0.4 mg total) under the tongue every 5 (five) minutes as needed for chest pain. 07/16/18 01/16/22  End, Harrell Gave, MD  ?Semaglutide (OZEMPIC, 1 MG/DOSE, [place]) Inject 1 mg into the skin once a week.    [provider]  ?SUMAtriptan (IMITREX) 100 MG tablet TAKE 1 TABLET (100 MG TOTAL) BY MOUTH AS NEEDED FOR MIGRAINE. MAY REPEAT IN 2 HOURS IF NEEDED 09/19/21   Glean Hess, MD  ?topiramate (TOPAMAX) 25 MG tablet TAKE 1-4 TABLETS (25-100 MG TOTAL) BY MOUTH AT BEDTIME. 10/23/21   Glean Hess, MD  ?triamcinolone (KENALOG) 0.025 % ointment Apply 1 application topically as needed. For rash 01/26/19   Rise Mu, PA-C  ? ? ?Family History ?Family History  ?Problem Relation Age of Onset  ? Breast cancer Mother 42  ? Heart attack Father 62  ? Alcohol abuse Father   ? Breast cancer Maternal Grandmother   ? Breast cancer Paternal Grandmother   ? ? ?Social History ?Social History  ? ?Tobacco Use  ? Smoking status: Former  ?  Packs/day: 0.25  ?  Years: 40.00  ?  Pack years: 10.00  ?  Types: Cigarettes  ?  Quit date: 09/18/2019   ?  Years since quitting: 2.4  ? Smokeless tobacco: Never  ?Vaping Use  ? Vaping Use: Never used  ?Substance Use Topics  ? Alcohol use: Yes  ?  Comment: less than once a week.  ? Drug use: Never  ? ? ? ?Allergies   ?Oxycodone ? ? ?Review of Systems ?Review of Systems  ?Constitutional:  Negative for fever.  ?HENT:  Negative for congestion, ear discharge and ear pain.   ?Musculoskeletal:  Positive for myalgias, neck pain and neck stiffness. Negative for arthralgias.  ?Skin:  Negative for color change.  ?Neurological:  Negative for weakness and  numbness.  ?Hematological: Negative.   ?Psychiatric/Behavioral: Negative.    ? ? ?Physical Exam ?Triage Vital Signs ?ED Triage Vitals  ?Enc Vitals Group  ?   BP 02/28/22 1050 116/76  ?   Pulse Rate 02/28/22 1050 90  ?   Resp 02/28/22 1050 16  ?   Temp 02/28/22 1050 98.2 ?F (36.8 ?C)  ?   Temp Source 02/28/22 1050 Oral  ?   SpO2 02/28/22 1050 96 %  ?   Weight --   ?   Height --   ?   Head Circumference --   ?   Peak Flow --   ?   Pain Score 02/28/22 1048 7  ?   Pain Loc --   ?   Pain Edu? --   ?   Excl. in [place]? --   ? ?No data found. ? ?Updated Vital Signs ?BP 116/76 (BP Location: Right Arm)   Pulse 90   Temp 98.2 ?F (36.8 ?C) (Oral)   Resp 16   SpO2 96%  ? ?Visual Acuity ?Right Eye Distance:   ?Left Eye Distance:   ?Bilateral Distance:   ? ?Right Eye Near:   ?Left Eye Near:    ?Bilateral Near:    ? ?Physical Exam ?Vitals reviewed.  ?Constitutional:   ?   Appearance: Normal appearance. She is not ill-appearing.  ?HENT:  ?   Head: Normocephalic and atraumatic.  ?Musculoskeletal:     ?   General: Tenderness present. No swelling, deformity or signs of injury.  ?Skin: ?   General: Skin is warm and dry.  ?   Capillary Refill: Capillary refill takes less than 2 seconds.  ?   Findings: No bruising or erythema.  ?Neurological:  ?   General: No focal deficit present.  ?   Mental Status: She is alert and oriented to person, place, and time.  ?   Sensory: No sensory deficit.  ?   Motor:  No weakness.  ?Psychiatric:     ?   Mood and Affect: Mood normal.     ?   Behavior: Behavior normal.     ?   Thought Content: Thought content normal.     ?   Judgment: Judgment normal.  ? ? ? ?UC Treatments / R

## 2022-02-28 NOTE — Discharge Instructions (Addendum)
Take the ibuprofen, 600 mg every 6 hours with food, on a schedule for the next 48 hours and then as needed. ? ?Take the baclofen, 10 mg every 8 hours, on a schedule for the next 48 hours and then as needed. ? ?Take the Valium at bedtime.  Take 1 tablet each night 20 minutes before bed to help aid in spasm relief.  Do not take this with your clonazepam. ? ?Apply moist heat to your neck for 30 minutes at a time 2-3 times a day to improve blood flow to the area and help remove the lactic acid causing the spasm. ? ?Follow the neck exercises given at discharge. ? ?Return for reevaluation for any new or worsening symptoms.  ?

## 2022-02-28 NOTE — ED Triage Notes (Signed)
Patient presents to Urgent Care with complaints of neck stiffness and nausea since yesterday. Pt states she was sitting in chair when this happened, unable to move her head up/down or side to side. She states she took a dose of flexeril and Tylenol  with no improvement.  ? ?Denies fever.  ?

## 2022-03-05 DIAGNOSIS — Z90710 Acquired absence of both cervix and uterus: Secondary | ICD-10-CM | POA: Insufficient documentation

## 2022-04-07 ENCOUNTER — Other Ambulatory Visit: Payer: Self-pay | Admitting: Internal Medicine

## 2022-04-07 DIAGNOSIS — I10 Essential (primary) hypertension: Secondary | ICD-10-CM

## 2022-04-07 DIAGNOSIS — F324 Major depressive disorder, single episode, in partial remission: Secondary | ICD-10-CM

## 2022-04-07 DIAGNOSIS — B354 Tinea corporis: Secondary | ICD-10-CM

## 2022-04-07 DIAGNOSIS — J209 Acute bronchitis, unspecified: Secondary | ICD-10-CM

## 2022-04-08 ENCOUNTER — Other Ambulatory Visit: Payer: Self-pay | Admitting: Internal Medicine

## 2022-04-08 DIAGNOSIS — F418 Other specified anxiety disorders: Secondary | ICD-10-CM

## 2022-04-09 NOTE — Telephone Encounter (Signed)
Requested medication (s) are due for refill today: no ? ?Requested medication (s) are on the active medication list: clotrimazole-betamethasone crm is on list, Albuterol INH is not on the list. ? ?Last refill:  10/23/21 for clotrimazole-betamethasone crm and 04/05/21 for Albuterol INH  ? ?Future visit scheduled: yes ? ?Notes to clinic:  Unable to refill per protocol,clotrimazole-betamethasone crm is not assigned to protocol. Review manually. Albuterol INH was discontinued 04/16/21. Last refill was 04/05/21.Unable to refill per protocol, Rx expired.  ? ? ?  ?Requested Prescriptions  ?Pending Prescriptions Disp Refills  ? clotrimazole-betamethasone (LOTRISONE) cream [Pharmacy Med Name: CLOTRIMAZOLE-BETAMETHASONE CRM] 30 g 0  ?  Sig: APPLY 1 APPLICATION TOPICALLY 2 (TWO) TIMES DAILY. TO RASH ON LEGS  ?  ? Off-Protocol Failed - 04/07/2022  1:47 PM  ?  ?  Failed - Medication not assigned to a protocol, review manually.  ?  ?  Passed - Valid encounter within last 12 months  ?  Recent Outpatient Visits   ? ?      ? 2 months ago Annual physical exam  ? [facility] Glean Hess, MD  ? 4 months ago Migraine without aura and without status migrainosus, not intractable  ? [facility] Glean Hess, MD  ? 11 months ago Essential hypertension  ? [facility] Glean Hess, MD  ? 1 year ago Bronchospasm with bronchitis, acute  ? [facility] Glean Hess, MD  ? 1 year ago Migraine without aura and without status migrainosus, not intractable  ? [facility] Glean Hess, MD  ? ?  ?  ?Future Appointments   ? ?        ? In 3 months Army Melia Jesse Sans, MD [facility], [place]  ? In 9 months Army Melia Jesse Sans, MD [facility], [facility]  ? ?  ? ? ?  ?  ?  ? albuterol (VENTOLIN HFA) 108 (90 Base) MCG/ACT inhaler [Pharmacy Med Name: ALBUTEROL HFA (PROVENTIL) INH] 6.7 each 1  ?  Sig: INHALE 2 PUFFS INTO THE LUNGS EVERY 4 HOURS AS NEEDED FOR WHEEZE OR FOR SHORTNESS OF  BREATH  ?  ? Pulmonology:  Beta Agonists 2 Passed - 04/07/2022  1:47 PM  ?  ?  Passed - Last BP in normal range  ?  BP Readings from Last 1 Encounters:  ?02/28/22 116/76  ?  ?  ?  ?  Passed - Last Heart Rate in normal range  ?  Pulse Readings from Last 1 Encounters:  ?02/28/22 90  ?  ?  ?  ?  Passed - Valid encounter within last 12 months  ?  Recent Outpatient Visits   ? ?      ? 2 months ago Annual physical exam  ? [facility] Glean Hess, MD  ? 4 months ago Migraine without aura and without status migrainosus, not intractable  ? [facility] Glean Hess, MD  ? 11 months ago Essential hypertension  ? [facility] Glean Hess, MD  ? 1 year ago Bronchospasm with bronchitis, acute  ? [facility] Glean Hess, MD  ? 1 year ago Migraine without aura and without status migrainosus, not intractable  ? [facility] Glean Hess, MD  ? ?  ?  ?Future Appointments   ? ?        ? In 3 months Army Melia Jesse Sans, MD [facility], [place]  ?  In 9 months Glean Hess, MD [facility], [place]  ? ?  ? ? ?  ?  ?  ?Signed Prescriptions Disp Refills  ? metFORMIN (GLUCOPHAGE) 500 MG tablet 180 tablet 1  ?  Sig: TAKE 1 TABLET BY MOUTH 2 TIMES DAILY WITH A MEAL.  ?  ? Endocrinology:  Diabetes - Biguanides Failed - 04/07/2022  1:47 PM  ?  ?  Failed - HBA1C is between 0 and 7.9 and within 180 days  ?  Hemoglobin A1C  ?Date Value Ref Range Status  ?06/26/2018 5.9  Final  ? ?Hgb A1c MFr Bld  ?Date Value Ref Range Status  ?01/18/2022 8.0 (H) 4.8 - 5.6 % Final  ?  Comment:  ?           Prediabetes: 5.7 - 6.4 ?         Diabetes: >6.4 ?         Glycemic control for adults with diabetes: <7.0 ?  ?  ?  ?  ?  Failed - B12 Level in normal range and within 720 days  ?  No results found for: VITAMINB12  ?  ?  ?  Passed - Cr in normal range and within 360 days  ?  Creatinine, Ser  ?Date Value Ref Range Status  ?01/18/2022 0.98 0.57 - 1.00 mg/dL Final  ?  ?  ?  ?   Passed - eGFR in normal range and within 360 days  ?  GFR calc Af Amer  ?Date Value Ref Range Status  ?09/11/2020 100 >59 mL/min/1.73 Final  ?  Comment:  ?  **[facility] currently reports eGFR in compliance with the current** ?  recommendations of the [employer]. [facility] will ?  update reporting as new guidelines are published from the NKF-ASN ?  Task force. ?  ? ?GFR calc non Af Amer  ?Date Value Ref Range Status  ?09/11/2020 87 >59 mL/min/1.73 Final  ? ?eGFR  ?Date Value Ref Range Status  ?01/18/2022 68 >59 mL/min/1.73 Final  ?  ?  ?  ?  Passed - Valid encounter within last 6 months  ?  Recent Outpatient Visits   ? ?      ? 2 months ago Annual physical exam  ? [facility] Glean Hess, MD  ? 4 months ago Migraine without aura and without status migrainosus, not intractable  ? [facility] Glean Hess, MD  ? 11 months ago Essential hypertension  ? [facility] Glean Hess, MD  ? 1 year ago Bronchospasm with bronchitis, acute  ? [facility] Glean Hess, MD  ? 1 year ago Migraine without aura and without status migrainosus, not intractable  ? [facility] Glean Hess, MD  ? ?  ?  ?Future Appointments   ? ?        ? In 3 months Army Melia Jesse Sans, MD [facility], [place]  ? In 9 months Glean Hess, MD [facility], [place]  ? ?  ? ? ?  ?  ?  Passed - CBC within normal limits and completed in the last 12 months  ?  WBC  ?Date Value Ref Range Status  ?01/18/2022 12.2 (H) 3.4 - 10.8 x10E3/uL Final  ?11/18/2020 11.6 (H) 4.0 - 10.5 K/uL Final  ? ?RBC  ?Date Value Ref Range Status  ?01/18/2022 4.77 3.77 - 5.28 x10E6/uL Final  ?11/18/2020 4.85 3.87 - 5.11 MIL/uL Final  ? ?  Hemoglobin  ?Date Value Ref Range Status  ?01/18/2022 14.0 11.1 - 15.9 g/dL Final  ? ?Hematocrit  ?Date Value Ref Range Status  ?01/18/2022 39.7 34.0 - 46.6 % Final  ? ?MCHC  ?Date Value Ref Range Status  ?01/18/2022 35.3 31.5 - 35.7 g/dL Final   ?11/18/2020 32.4 30.0 - 36.0 g/dL Final  ? ?MCH  ?Date Value Ref Range Status  ?01/18/2022 29.4 26.6 - 33.0 pg Final  ?11/18/2020 27.8 26.0 - 34.0 pg Final  ? ?MCV  ?Date Value Ref Range Status  ?01/18/2022 83 79 - 97 fL Final  ? ?No results found for: PLTCOUNTKUC, LABPLAT, [place] ?RDW  ?Date Value Ref Range Status  ?01/18/2022 14.3 11.7 - 15.4 % Final  ? ?  ?  ?  ? buPROPion (WELLBUTRIN XL) 300 MG 24 hr tablet 90 tablet 1  ?  Sig: TAKE 1 TABLET BY MOUTH EVERY DAY  ?  ? Psychiatry: Antidepressants - bupropion Failed - 04/07/2022  1:47 PM  ?  ?  Failed - AST in normal range and within 360 days  ?  AST  ?Date Value Ref Range Status  ?01/18/2022 49 (H) 0 - 40 IU/L Final  ?  ?  ?  ?  Failed - ALT in normal range and within 360 days  ?  ALT  ?Date Value Ref Range Status  ?01/18/2022 42 (H) 0 - 32 IU/L Final  ?  ?  ?  ?  Passed - Cr in normal range and within 360 days  ?  Creatinine, Ser  ?Date Value Ref Range Status  ?01/18/2022 0.98 0.57 - 1.00 mg/dL Final  ?  ?  ?  ?  Passed - Last BP in normal range  ?  BP Readings from Last 1 Encounters:  ?02/28/22 116/76  ?  ?  ?  ?  Passed - Valid encounter within last 6 months  ?  Recent Outpatient Visits   ? ?      ? 2 months ago Annual physical exam  ? [facility] Glean Hess, MD  ? 4 months ago Migraine without aura and without status migrainosus, not intractable  ? [facility] Glean Hess, MD  ? 11 months ago Essential hypertension  ? [facility] Glean Hess, MD  ? 1 year ago Bronchospasm with bronchitis, acute  ? [facility] Glean Hess, MD  ? 1 year ago Migraine without aura and without status migrainosus, not intractable  ? [facility] Glean Hess, MD  ? ?  ?  ?Future Appointments   ? ?        ? In 3 months Army Melia Jesse Sans, MD [facility], [place]  ? In 9 months Glean Hess, MD [facility]  ? ?  ? ? ?  ?  ?  ? hydrochlorothiazide (HYDRODIURIL) 25 MG tablet 90 tablet  1  ?  Sig: TAKE 1 TABLET (25 MG TOTAL) BY MOUTH DAILY.  ?  ? Cardiovascular: Diuretics - Thiazide Failed - 04/07/2022  1:47 PM  ?  ?  Failed - K in normal range and within 180 days  ?  Potassium  ?Date Value Ref R

## 2022-04-09 NOTE — Telephone Encounter (Signed)
Requested Prescriptions  ?Pending Prescriptions Disp Refills  ?? metFORMIN (GLUCOPHAGE) 500 MG tablet [Pharmacy Med Name: METFORMIN HCL 500 MG TABLET] 180 tablet 1  ?  Sig: TAKE 1 TABLET BY MOUTH 2 TIMES DAILY WITH A MEAL.  ?  ? Endocrinology:  Diabetes - Biguanides Failed - 04/07/2022  1:47 PM  ?  ?  Failed - HBA1C is between 0 and 7.9 and within 180 days  ?  Hemoglobin A1C  ?Date Value Ref Range Status  ?06/26/2018 5.9  Final  ? ?Hgb A1c MFr Bld  ?Date Value Ref Range Status  ?01/18/2022 8.0 (H) 4.8 - 5.6 % Final  ?  Comment:  ?           Prediabetes: 5.7 - 6.4 ?         Diabetes: >6.4 ?         Glycemic control for adults with diabetes: <7.0 ?  ?   ?  ?  Failed - B12 Level in normal range and within 720 days  ?  No results found for: VITAMINB12   ?  ?  Passed - Cr in normal range and within 360 days  ?  Creatinine, Ser  ?Date Value Ref Range Status  ?01/18/2022 0.98 0.57 - 1.00 mg/dL Final  ?   ?  ?  Passed - eGFR in normal range and within 360 days  ?  GFR calc Af Amer  ?Date Value Ref Range Status  ?09/11/2020 100 >59 mL/min/1.73 Final  ?  Comment:  ?  **[facility] currently reports eGFR in compliance with the current** ?  recommendations of the [employer]. [facility] will ?  update reporting as new guidelines are published from the NKF-ASN ?  Task force. ?  ? ?GFR calc non Af Amer  ?Date Value Ref Range Status  ?09/11/2020 87 >59 mL/min/1.73 Final  ? ?eGFR  ?Date Value Ref Range Status  ?01/18/2022 68 >59 mL/min/1.73 Final  ?   ?  ?  Passed - Valid encounter within last 6 months  ?  Recent Outpatient Visits   ?      ? 2 months ago Annual physical exam  ? [facility] Glean Hess, MD  ? 4 months ago Migraine without aura and without status migrainosus, not intractable  ? [facility] Glean Hess, MD  ? 11 months ago Essential hypertension  ? [facility] Glean Hess, MD  ? 1 year ago Bronchospasm with bronchitis, acute  ? [facility] Glean Hess, MD  ? 1 year ago Migraine without aura and without status migrainosus, not intractable  ? [facility] Glean Hess, MD  ?  ?  ?Future Appointments   ?        ? In 3 months Army Melia Jesse Sans, MD [facility], [place]  ? In 9 months Glean Hess, MD [facility]  ?  ? ?  ?  ?  Passed - CBC within normal limits and completed in the last 12 months  ?  WBC  ?Date Value Ref Range Status  ?01/18/2022 12.2 (H) 3.4 - 10.8 x10E3/uL Final  ?11/18/2020 11.6 (H) 4.0 - 10.5 K/uL Final  ? ?RBC  ?Date Value Ref Range Status  ?01/18/2022 4.77 3.77 - 5.28 x10E6/uL Final  ?11/18/2020 4.85 3.87 - 5.11 MIL/uL Final  ? ?Hemoglobin  ?Date Value Ref Range Status  ?01/18/2022 14.0 11.1 - 15.9 g/dL Final  ? ?Hematocrit  ?Date  Value Ref Range Status  ?01/18/2022 39.7 34.0 - 46.6 % Final  ? ?MCHC  ?Date Value Ref Range Status  ?01/18/2022 35.3 31.5 - 35.7 g/dL Final  ?11/18/2020 32.4 30.0 - 36.0 g/dL Final  ? ?MCH  ?Date Value Ref Range Status  ?01/18/2022 29.4 26.6 - 33.0 pg Final  ?11/18/2020 27.8 26.0 - 34.0 pg Final  ? ?MCV  ?Date Value Ref Range Status  ?01/18/2022 83 79 - 97 fL Final  ? ?No results found for: PLTCOUNTKUC, LABPLAT, [place] ?RDW  ?Date Value Ref Range Status  ?01/18/2022 14.3 11.7 - 15.4 % Final  ? ?  ?  ?  ?? buPROPion (WELLBUTRIN XL) 300 MG 24 hr tablet [Pharmacy Med Name: BUPROPION HCL XL 300 MG TABLET] 90 tablet 1  ?  Sig: TAKE 1 TABLET BY MOUTH EVERY DAY  ?  ? Psychiatry: Antidepressants - bupropion Failed - 04/07/2022  1:47 PM  ?  ?  Failed - AST in normal range and within 360 days  ?  AST  ?Date Value Ref Range Status  ?01/18/2022 49 (H) 0 - 40 IU/L Final  ?   ?  ?  Failed - ALT in normal range and within 360 days  ?  ALT  ?Date Value Ref Range Status  ?01/18/2022 42 (H) 0 - 32 IU/L Final  ?   ?  ?  Passed - Cr in normal range and within 360 days  ?  Creatinine, Ser  ?Date Value Ref Range Status  ?01/18/2022 0.98 0.57 - 1.00 mg/dL Final  ?   ?  ?  Passed - Last BP in  normal range  ?  BP Readings from Last 1 Encounters:  ?02/28/22 116/76  ?   ?  ?  Passed - Valid encounter within last 6 months  ?  Recent Outpatient Visits   ?      ? 2 months ago Annual physical exam  ? [facility] Glean Hess, MD  ? 4 months ago Migraine without aura and without status migrainosus, not intractable  ? [facility] Glean Hess, MD  ? 11 months ago Essential hypertension  ? [facility] Glean Hess, MD  ? 1 year ago Bronchospasm with bronchitis, acute  ? [facility] Glean Hess, MD  ? 1 year ago Migraine without aura and without status migrainosus, not intractable  ? [facility] Glean Hess, MD  ?  ?  ?Future Appointments   ?        ? In 3 months Army Melia Jesse Sans, MD [facility], [place]  ? In 9 months Glean Hess, MD [facility]  ?  ? ?  ?  ?  ?? hydrochlorothiazide (HYDRODIURIL) 25 MG tablet [Pharmacy Med Name: HYDROCHLOROTHIAZIDE 25 MG TAB] 90 tablet 1  ?  Sig: TAKE 1 TABLET (25 MG TOTAL) BY MOUTH DAILY.  ?  ? Cardiovascular: Diuretics - Thiazide Failed - 04/07/2022  1:47 PM  ?  ?  Failed - K in normal range and within 180 days  ?  Potassium  ?Date Value Ref Range Status  ?01/18/2022 2.9 (L) 3.5 - 5.2 mmol/L Final  ?   ?  ?  Passed - Cr in normal range and within 180 days  ?  Creatinine, Ser  ?Date Value Ref Range Status  ?01/18/2022 0.98 0.57 - 1.00 mg/dL Final  ?   ?  ?  Passed - Na in normal range and  within 180 days  ?  Sodium  ?Date Value Ref Range Status  ?01/18/2022 141 134 - 144 mmol/L Final  ?   ?  ?  Passed - Last BP in normal range  ?  BP Readings from Last 1 Encounters:  ?02/28/22 116/76  ?   ?  ?  Passed - Valid encounter within last 6 months  ?  Recent Outpatient Visits   ?      ? 2 months ago Annual physical exam  ? [facility] Glean Hess, MD  ? 4 months ago Migraine without aura and without status migrainosus, not intractable  ? [facility] Glean Hess, MD  ? 11 months ago Essential hypertension  ? [facility] Glean Hess, MD  ? 1 year ago Bronchospasm with bronchitis, acute  ? [facility] Glean Hess, MD  ? 1 year ago Migraine without aura and without status migrainosus, not intractable  ? [facility] Glean Hess, MD  ?  ?  ?Future Appointments   ?        ? In 3 months Army Melia Jesse Sans, MD [facility], [place]  ? In 9 months Glean Hess, MD [facility]  ?  ? ?  ?  ?  ?? clotrimazole-betamethasone (LOTRISONE) cream [Pharmacy Med Name: CLOTRIMAZOLE-BETAMETHASONE CRM] 30 g 0  ?  Sig: APPLY 1 APPLICATION TOPICALLY 2 (TWO) TIMES DAILY. TO RASH ON LEGS  ?  ? Off-Protocol Failed - 04/07/2022  1:47 PM  ?  ?  Failed - Medication not assigned to a protocol, review manually.  ?  ?  Passed - Valid encounter within last 12 months  ?  Recent Outpatient Visits   ?      ? 2 months ago Annual physical exam  ? [facility] Glean Hess, MD  ? 4 months ago Migraine without aura and without status migrainosus, not intractable  ? [facility] Glean Hess, MD  ? 11 months ago Essential hypertension  ? [facility] Glean Hess, MD  ? 1 year ago Bronchospasm with bronchitis, acute  ? [facility] Glean Hess, MD  ? 1 year ago Migraine without aura and without status migrainosus, not intractable  ? [facility] Glean Hess, MD  ?  ?  ?Future Appointments   ?        ? In 3 months Army Melia Jesse Sans, MD [facility], [place]  ? In 9 months Glean Hess, MD [facility]  ?  ? ?  ?  ?  ?? albuterol (VENTOLIN HFA) 108 (90 Base) MCG/ACT inhaler [Pharmacy Med Name: ALBUTEROL HFA (PROVENTIL) INH] 6.7 each 1  ?  Sig: INHALE 2 PUFFS INTO THE LUNGS EVERY 4 HOURS AS NEEDED FOR WHEEZE OR FOR SHORTNESS OF BREATH  ?  ? Pulmonology:  Beta Agonists 2 Passed - 04/07/2022  1:47 PM  ?  ?  Passed - Last BP in normal range  ?  BP  Readings from Last 1 Encounters:  ?02/28/22 116/76  ?   ?  ?  Passed - Last Heart Rate in normal range  ?  Pulse Readings from Last 1 Encounters:  ?02/28/22 90  ?   ?  ?  Passed - Valid encounter within last 12

## 2022-04-10 NOTE — Telephone Encounter (Signed)
Requested medication (s) are due for refill today - yes ? ?Requested medication (s) are on the active medication list -yes ? ?Future visit scheduled - yes ? ?Last refill: 11/26/21 #60 ? ?Notes to clinic: Request RF: non delegated Rx ? ?Requested Prescriptions  ?Pending Prescriptions Disp Refills  ? clonazePAM (KLONOPIN) 0.25 MG disintegrating tablet [Pharmacy Med Name: CLONAZEPAM 0.25 MG ODT] 60 tablet 0  ?  Sig: TAKE 1 TABLET BY MOUTH TWICE A DAY AS NEEDED  ?  ? Not Delegated - Psychiatry: Anxiolytics/Hypnotics 2 Failed - 04/08/2022  4:03 PM  ?  ?  Failed - This refill cannot be delegated  ?  ?  Failed - Urine Drug Screen completed in last 360 days  ?  ?  Passed - Patient is not pregnant  ?  ?  Passed - Valid encounter within last 6 months  ?  Recent Outpatient Visits   ? ?      ? 2 months ago Annual physical exam  ? [facility] Reubin Milan, MD  ? 4 months ago Migraine without aura and without status migrainosus, not intractable  ? [facility] Reubin Milan, MD  ? 11 months ago Essential hypertension  ? [facility] Reubin Milan, MD  ? 1 year ago Bronchospasm with bronchitis, acute  ? [facility] Reubin Milan, MD  ? 1 year ago Migraine without aura and without status migrainosus, not intractable  ? [facility] Reubin Milan, MD  ? ?  ?  ?Future Appointments   ? ?        ? In 3 months Judithann Graves Nyoka Cowden, MD [facility]  ? In 9 months Reubin Milan, MD [facility]  ? ?  ? ? ?  ?  ?  ? ? ? ?Requested Prescriptions  ?Pending Prescriptions Disp Refills  ? clonazePAM (KLONOPIN) 0.25 MG disintegrating tablet [Pharmacy Med Name: CLONAZEPAM 0.25 MG ODT] 60 tablet 0  ?  Sig: TAKE 1 TABLET BY MOUTH TWICE A DAY AS NEEDED  ?  ? Not Delegated - Psychiatry: Anxiolytics/Hypnotics 2 Failed - 04/08/2022  4:03 PM  ?  ?  Failed - This refill cannot be delegated  ?  ?  Failed - Urine Drug Screen completed in last 360 days  ?  ?   Passed - Patient is not pregnant  ?  ?  Passed - Valid encounter within last 6 months  ?  Recent Outpatient Visits   ? ?      ? 2 months ago Annual physical exam  ? [facility] Reubin Milan, MD  ? 4 months ago Migraine without aura and without status migrainosus, not intractable  ? [facility] Reubin Milan, MD  ? 11 months ago Essential hypertension  ? [facility] Reubin Milan, MD  ? 1 year ago Bronchospasm with bronchitis, acute  ? [facility] Reubin Milan, MD  ? 1 year ago Migraine without aura and without status migrainosus, not intractable  ? [facility] Reubin Milan, MD  ? ?  ?  ?Future Appointments   ? ?        ? In 3 months Judithann Graves Nyoka Cowden, MD [facility]  ? In 9 months Reubin Milan, MD [facility]  ? ?  ? ? ?  ?  ?  ? ? ? ?

## 2022-04-10 NOTE — Telephone Encounter (Signed)
Please review. Last office visit 01/16/2022. ? ?KP

## 2022-05-08 ENCOUNTER — Ambulatory Visit: Payer: Self-pay

## 2022-05-08 ENCOUNTER — Ambulatory Visit (INDEPENDENT_AMBULATORY_CARE_PROVIDER_SITE_OTHER): Payer: 59 | Admitting: Internal Medicine

## 2022-05-08 ENCOUNTER — Encounter: Payer: Self-pay | Admitting: Internal Medicine

## 2022-05-08 VITALS — BP 122/58 | HR 76 | Ht 63.0 in | Wt 150.0 lb

## 2022-05-08 DIAGNOSIS — R1013 Epigastric pain: Secondary | ICD-10-CM | POA: Diagnosis not present

## 2022-05-08 MED ORDER — ONDANSETRON 4 MG PO TBDP: 4.0000 mg | ORAL_TABLET | Freq: Three times a day (TID) | ORAL | 0 refills | Status: DC | PRN

## 2022-05-08 NOTE — Progress Notes (Signed)
Date:  05/08/2022   Name:  Nancy Skinner   DOB:  07-01-1966   MRN:  [id number]   Chief Complaint: Emesis  Emesis  This is a new problem. Episode onset: 4 weeks ago after stopping Ozempic injections. The problem has been waxing and waning. The emesis has an appearance of bile and stomach contents (Light Blood at times). There has been no fever. Associated symptoms include abdominal pain, diarrhea (soft stools with greasy surface in commode water), headaches, sweats and weight loss. Pertinent negatives include no chest pain, chills or fever.   Lab Results  Component Value Date   NA 141 01/18/2022   K 2.9 (L) 01/18/2022   CO2 29 01/18/2022   GLUCOSE 112 (H) 01/18/2022   BUN 12 01/18/2022   CREATININE 0.98 01/18/2022   CALCIUM 9.5 01/18/2022   EGFR 68 01/18/2022   GFRNONAA 87 09/11/2020   Lab Results  Component Value Date   CHOL 166 12/05/2021   HDL 38 (L) 12/05/2021   LDLCALC 74 12/05/2021   LDLDIRECT 48 09/02/2019   TRIG 337 (H) 12/05/2021   CHOLHDL 4.4 12/05/2021   Lab Results  Component Value Date   TSH 4.040 12/05/2021   Lab Results  Component Value Date   HGBA1C 8.0 (H) 01/18/2022   Lab Results  Component Value Date   WBC 12.2 (H) 01/18/2022   HGB 14.0 01/18/2022   HCT 39.7 01/18/2022   MCV 83 01/18/2022   PLT 388 01/18/2022   Lab Results  Component Value Date   ALT 42 (H) 01/18/2022   AST 49 (H) 01/18/2022   ALKPHOS 119 01/18/2022   BILITOT 0.3 01/18/2022   No results found for: 25OHVITD2, 25OHVITD3, VD25OH   Review of Systems  Constitutional:  Positive for unexpected weight change (has lost 10 more lbs since stopping Ozempic) and weight loss. Negative for chills, diaphoresis and fever.  Respiratory:  Negative for chest tightness and shortness of breath.   Cardiovascular:  Negative for chest pain and palpitations.  Gastrointestinal:  Positive for abdominal pain, diarrhea (soft stools with greasy surface in commode water), nausea and vomiting.  Negative for rectal pain.  Genitourinary:  Negative for dysuria.  Neurological:  Positive for headaches.  Psychiatric/Behavioral:  Positive for dysphoric mood. Negative for sleep disturbance. The patient is nervous/anxious.    Patient Active Problem List   Diagnosis Date Noted   Status post hysterectomy 03/05/2022   Essential hypertension 04/16/2021   Hepatic steatosis 03/19/2021   Carpal tunnel syndrome of right wrist 07/10/2020   Screening for colon cancer    Coronary artery disease of native artery of native heart with stable angina pectoris ([place]) 53/97/6734   Neutrophilic leukocytosis 19/37/9024   Pulmonary nodule less than 6 cm determined by computed tomography of lung 07/30/2018   Night sweats 07/16/2018   Neck muscle spasm 07/15/2018   Mixed hyperlipidemia 07/15/2018   Tobacco use disorder, moderate, in sustained remission 07/15/2018   Migraine without aura and without status migrainosus, not intractable 06/24/2018   Gastroesophageal reflux disease without esophagitis 06/24/2018   Chronic midline low back pain without sciatica 06/24/2018    Allergies  Allergen Reactions   Oxycodone Other (See Comments)    "Black outs" and confusion    Past Surgical History:  Procedure Laterality Date   ABDOMINAL HYSTERECTOMY  2012   partial for bleeding   BACK SURGERY     BREAST BIOPSY Right    neg   BREAST BIOPSY Left 2015   4 bxs- neg  BREAST SURGERY Left    multiple biopsies   CARDIAC CATHETERIZATION     CATARACT EXTRACTION Right 12/19/2010   CHOLECYSTECTOMY  1999   COLONOSCOPY WITH PROPOFOL N/A 03/31/2020   Procedure: COLONOSCOPY WITH PROPOFOL;  Surgeon: Lucilla Lame, MD;  Location: [facility] ENDOSCOPY;  Service: Endoscopy;  Laterality: N/A;   EYE SURGERY     INTRAVASCULAR PRESSURE WIRE/FFR STUDY N/A 07/22/2018   Procedure: INTRAVASCULAR PRESSURE WIRE/FFR STUDY;  Surgeon: Nelva Bush, MD;  Location: [place] [facility];  Service: Cardiovascular;  Laterality: N/A;   LEFT  HEART CATH AND CORONARY ANGIOGRAPHY N/A 07/22/2018   Procedure: LEFT HEART CATH AND CORONARY ANGIOGRAPHY;  Surgeon: Nelva Bush, MD;  Location: [place] [facility];  Service: Cardiovascular;  Laterality: N/A;   POSTERIOR LUMBAR FUSION 4 LEVEL  2000    Social History   Tobacco Use   Smoking status: Former    Packs/day: 0.25    Years: 40.00    Pack years: 10.00    Types: Cigarettes    Quit date: 09/18/2019    Years since quitting: 2.6   Smokeless tobacco: Never  Vaping Use   Vaping Use: Never used  Substance Use Topics   Alcohol use: Yes    Comment: less than once a week.   Drug use: Never     Medication list has been reviewed and updated.  Current Meds  Medication Sig   albuterol (VENTOLIN HFA) 108 (90 Base) MCG/ACT inhaler INHALE 2 PUFFS INTO THE LUNGS EVERY 4 HOURS AS NEEDED FOR WHEEZE OR FOR SHORTNESS OF BREATH   aspirin EC 81 MG tablet Take 81 mg by mouth daily.   atorvastatin (LIPITOR) 40 MG tablet TAKE 1 TABLET BY MOUTH EVERY DAY   baclofen (LIORESAL) 10 MG tablet Take 1 tablet (10 mg total) by mouth 3 (three) times daily.   buPROPion (WELLBUTRIN XL) 300 MG 24 hr tablet TAKE 1 TABLET BY MOUTH EVERY DAY   clonazePAM (KLONOPIN) 0.25 MG disintegrating tablet TAKE 1 TABLET BY MOUTH TWICE A DAY AS NEEDED   clotrimazole-betamethasone (LOTRISONE) cream APPLY 1 APPLICATION TOPICALLY 2 (TWO) TIMES DAILY. TO RASH ON LEGS   escitalopram (LEXAPRO) 20 MG tablet TAKE 1 TABLET BY MOUTH EVERY DAY   Esomeprazole Magnesium (NEXIUM PO) Take 20 mg by mouth daily.    hydrochlorothiazide (HYDRODIURIL) 25 MG tablet TAKE 1 TABLET (25 MG TOTAL) BY MOUTH DAILY.   ibuprofen (ADVIL) 600 MG tablet Take 1 tablet (600 mg total) by mouth every 6 (six) hours as needed.   Lidocaine 4 % PTCH Apply 1 patch topically daily as needed (pain).    metFORMIN (GLUCOPHAGE) 500 MG tablet TAKE 1 TABLET BY MOUTH 2 TIMES DAILY WITH A MEAL.   Multiple Vitamin (MULTIVITAMIN) capsule Take 1 capsule by mouth daily.    nitroGLYCERIN (NITROSTAT) 0.4 MG SL tablet Place 1 tablet (0.4 mg total) under the tongue every 5 (five) minutes as needed for chest pain.   ondansetron (ZOFRAN-ODT) 8 MG disintegrating tablet Take 1 tablet (8 mg total) by mouth every 8 (eight) hours as needed for nausea or vomiting.   SUMAtriptan (IMITREX) 100 MG tablet TAKE 1 TABLET (100 MG TOTAL) BY MOUTH AS NEEDED FOR MIGRAINE. MAY REPEAT IN 2 HOURS IF NEEDED   topiramate (TOPAMAX) 25 MG tablet TAKE 1-4 TABLETS (25-100 MG TOTAL) BY MOUTH AT BEDTIME.   triamcinolone (KENALOG) 0.025 % ointment Apply 1 application topically as needed. For rash       01/16/2022    8:07 AM 12/05/2021  1:29 PM 04/16/2021    1:33 PM 03/14/2021    3:36 PM  GAD 7 : Generalized Anxiety Score  Nervous, Anxious, on Edge 3 3 3  0  Control/stop worrying 3 3 3  0  Worry too much - different things 3 3 3  0  Trouble relaxing 3 3 3  0  Restless 3 3 3  0  Easily annoyed or irritable 3 3 3  0  Afraid - awful might happen 1 1 0 0  Total GAD 7 Score 19 19 18  0  Anxiety Difficulty   Somewhat difficult        01/16/2022    8:07 AM  Depression screen PHQ 2/9  Decreased Interest 3  Down, Depressed, Hopeless 3  PHQ - 2 Score 6  Altered sleeping 3  Tired, decreased energy 3  Change in appetite 3  Feeling bad or failure about yourself  3  Trouble concentrating 0  Moving slowly or fidgety/restless 0  Suicidal thoughts 0  PHQ-9 Score 18    BP Readings from Last 3 Encounters:  05/08/22 (!) 122/58  02/28/22 116/76  01/16/22 104/76    Physical Exam Vitals and nursing note reviewed.  Constitutional:      General: She is not in acute distress.    Appearance: She is well-developed. She is not toxic-appearing or diaphoretic.  HENT:     Head: Normocephalic and atraumatic.  Cardiovascular:     Rate and Rhythm: Normal rate and regular rhythm.     Pulses: Normal pulses.  Pulmonary:     Effort: Pulmonary effort is normal. No respiratory distress.     Breath sounds: No  wheezing or rhonchi.  Abdominal:     General: Abdomen is flat.     Palpations: Abdomen is soft. There is no hepatomegaly or splenomegaly.     Tenderness: There is abdominal tenderness in the epigastric area and left upper quadrant. There is guarding. There is no right CVA tenderness, left CVA tenderness or rebound.  Musculoskeletal:     Cervical back: Normal range of motion.  Lymphadenopathy:     Cervical: No cervical adenopathy.  Skin:    General: Skin is warm and dry.     Findings: No rash.  Neurological:     Mental Status: She is alert and oriented to person, place, and time.  Psychiatric:        Mood and Affect: Mood normal.        Behavior: Behavior normal.    Wt Readings from Last 3 Encounters:  05/08/22 150 lb (68 kg)  01/16/22 191 lb (86.6 kg)  12/05/21 196 lb (88.9 kg)    BP (!) 122/58   Pulse 76   Ht 5' 3"  (1.6 m)   Wt 150 lb (68 kg)   SpO2 98%   BMI 26.57 kg/m   Assessment and Plan: 1. Epigastric pain Suspect pancreatitis after Ozempic. Needs to continue bland diet, push fluids - will give zofran to help. [place] and labs. Go to ED if symptoms worsen - CBC with Differential/Platelet - Lipase - ondansetron (ZOFRAN-ODT) 4 MG disintegrating tablet; Take 1 tablet (4 mg total) by mouth every 8 (eight) hours as needed for nausea or vomiting.  Dispense: 40 tablet; Refill: 0 - US Abdomen Complete - Comprehensive metabolic panel   Partially dictated using Dragon software. Any errors are unintentional.  Halina Maidens, MD [facility]  05/08/2022

## 2022-05-08 NOTE — Telephone Encounter (Signed)
Noted  KP 

## 2022-05-08 NOTE — Telephone Encounter (Signed)
  Chief Complaint: Vomiting since Saturday night. Diarrhea Symptoms: Vomiting cramps Frequency: since Saturday Pertinent Negatives: Patient denies fever, Ha Disposition: [] ED /[] Urgent Care (no appt availability in office) / [x] Appointment(In office/virtual)/ []  [place] [facility]/ [] Home Care/ [] Refused Recommended Disposition /[] [place] [facility]/ []  Follow-up with PCP Additional Notes: Pt has had vomiting since late Saturday. Pt has been unable to keep anything down. Pt is also having small stool diarrhea with bile in it. Reason for Disposition  [1] MILD or MODERATE vomiting AND [2] present > 48 hours (2 days) (Exception: mild vomiting with associated diarrhea)  [1] Vomiting AND [2] contains bile (green color)  Answer Assessment - Initial Assessment Questions 1. VOMITING SEVERITY: "How many times have you vomited in the past 24 hours?"     - MILD:  1 - 2 times/day    - MODERATE: 3 - 5 times/day, decreased oral intake without significant weight loss or symptoms of dehydration    - SEVERE: 6 or more times/day, vomits everything or nearly everything, with significant weight loss, symptoms of dehydration      moderate 2. ONSET: "When did the vomiting begin?"      Saturday 3. FLUIDS: "What fluids or food have you vomited up today?" "Have you been able to keep any fluids down?"     no 4. ABDOMINAL PAIN: "Are your having any abdominal pain?" If yes : "How bad is it and what does it feel like?" (e.g., crampy, dull, intermittent, constant)      cramping 5. DIARRHEA: "Is there any diarrhea?" If Yes, ask: "How many times today?"      Yes -  6. CONTACTS: "Is there anyone else in the family with the same symptoms?"      no 7. CAUSE: "What do you think is causing your vomiting?"     unsure 8. HYDRATION STATUS: "Any signs of dehydration?" (e.g., dry mouth [not only dry lips], too weak to stand) "When did you last urinate?"     30 minutes ago urinated 9. OTHER SYMPTOMS: "Do you have any  other symptoms?" (e.g., fever, headache, vertigo, vomiting blood or coffee grounds, recent head injury)     dizziness 10. PREGNANCY: "Is there any chance you are pregnant?" "When was your last menstrual period?"       na  Protocols used: Vomiting-A-AH

## 2022-05-09 LAB — CBC WITH DIFFERENTIAL/PLATELET
Basophils Absolute: 0.1 10*3/uL (ref 0.0–0.2)
Basos: 1 %
EOS (ABSOLUTE): 0.2 10*3/uL (ref 0.0–0.4)
Eos: 1 %
Hematocrit: 41.6 % (ref 34.0–46.6)
Hemoglobin: 14.6 g/dL (ref 11.1–15.9)
Immature Grans (Abs): 0 10*3/uL (ref 0.0–0.1)
Immature Granulocytes: 0 %
Lymphocytes Absolute: 6.3 10*3/uL — ABNORMAL HIGH (ref 0.7–3.1)
Lymphs: 41 %
MCH: 29.8 pg (ref 26.6–33.0)
MCHC: 35.1 g/dL (ref 31.5–35.7)
MCV: 85 fL (ref 79–97)
Monocytes Absolute: 0.9 10*3/uL (ref 0.1–0.9)
Monocytes: 6 %
Neutrophils Absolute: 8.1 10*3/uL — ABNORMAL HIGH (ref 1.4–7.0)
Neutrophils: 51 %
Platelets: 491 10*3/uL — ABNORMAL HIGH (ref 150–450)
RBC: 4.9 x10E6/uL (ref 3.77–5.28)
RDW: 14.7 % (ref 11.7–15.4)
WBC: 15.6 10*3/uL — ABNORMAL HIGH (ref 3.4–10.8)

## 2022-05-09 LAB — COMPREHENSIVE METABOLIC PANEL
ALT: 37 IU/L — ABNORMAL HIGH (ref 0–32)
AST: 34 IU/L (ref 0–40)
Albumin/Globulin Ratio: 1.6 (ref 1.2–2.2)
Albumin: 4.6 g/dL (ref 3.8–4.9)
Alkaline Phosphatase: 108 IU/L (ref 44–121)
BUN/Creatinine Ratio: 13 (ref 9–23)
BUN: 16 mg/dL (ref 6–24)
Bilirubin Total: 0.7 mg/dL (ref 0.0–1.2)
CO2: 31 mmol/L — ABNORMAL HIGH (ref 20–29)
Calcium: 10.6 mg/dL — ABNORMAL HIGH (ref 8.7–10.2)
Chloride: 89 mmol/L — ABNORMAL LOW (ref 96–106)
Creatinine, Ser: 1.27 mg/dL — ABNORMAL HIGH (ref 0.57–1.00)
Globulin, Total: 2.8 g/dL (ref 1.5–4.5)
Glucose: 111 mg/dL — ABNORMAL HIGH (ref 70–99)
Potassium: 3.3 mmol/L — ABNORMAL LOW (ref 3.5–5.2)
Sodium: 141 mmol/L (ref 134–144)
Total Protein: 7.4 g/dL (ref 6.0–8.5)
eGFR: 50 mL/min/{1.73_m2} — ABNORMAL LOW (ref >59)

## 2022-05-09 LAB — LIPASE: Lipase: 31 U/L (ref 14–72)

## 2022-05-14 ENCOUNTER — Ambulatory Visit: Payer: Self-pay | Admitting: *Deleted

## 2022-05-14 ENCOUNTER — Ambulatory Visit
Admission: RE | Admit: 2022-05-14 | Discharge: 2022-05-14 | Disposition: A | Discharge status: Home / Self Care | Payer: 59 | Source: Ambulatory Visit | Attending: Internal Medicine | Admitting: Internal Medicine

## 2022-05-14 DIAGNOSIS — R1013 Epigastric pain: Secondary | ICD-10-CM | POA: Diagnosis present

## 2022-05-14 NOTE — Telephone Encounter (Signed)
Results from ultrasound given by Jerline Pain, RN from [facility]. Per Kellie Simmering D.O. , prior cholecystectomy. Hyperechogenicity of the hepatic parenchyma. This is a nonspecific finding, which may be seen in the setting of hepatic steatosis or other chronic hepatic parenchymal disease. The pancreas is partially obscured by overlying bowel gas.  Otherwise unremarkable abdominal ultrasound , as described.  FC , Amanda notified of results being sent to office.

## 2022-05-14 NOTE — Telephone Encounter (Signed)
Dr Judithann Graves informed. She will result [place] and I will call patient with information.

## 2022-05-16 ENCOUNTER — Encounter: Payer: Self-pay | Admitting: Medical

## 2022-05-16 ENCOUNTER — Ambulatory Visit (INDEPENDENT_AMBULATORY_CARE_PROVIDER_SITE_OTHER): Payer: 59 | Admitting: Medical

## 2022-05-16 VITALS — BP 92/60 | HR 72 | Ht 63.0 in | Wt 150.0 lb

## 2022-05-16 DIAGNOSIS — I10 Essential (primary) hypertension: Secondary | ICD-10-CM

## 2022-05-16 DIAGNOSIS — I25118 Atherosclerotic heart disease of native coronary artery with other forms of angina pectoris: Secondary | ICD-10-CM

## 2022-05-16 DIAGNOSIS — E782 Mixed hyperlipidemia: Secondary | ICD-10-CM

## 2022-05-16 MED ORDER — FENOFIBRATE 145 MG PO TABS: 145.0000 mg | ORAL_TABLET | Freq: Every day | ORAL | 3 refills | Status: DC

## 2022-05-16 NOTE — Progress Notes (Unsigned)
Cardiology Office Note:    Date:  05/18/2022   ID:  [name], DOB 05-15-1966, MRN [id number]  PCP:  Reubin Milan, MD  [facility] Cardiologist:  Yvonne Kendall, MD  [facility] Electrophysiologist:  None   Referring MD: Reubin Milan, MD   Chief Complaint: 12 month f/u  History of Present Illness:    Nancy Skinner is a 56 y.o. female with a hx of nonobstructive coronary disease, hyperlipidemia, migraine disorder, depression, tobacco use, and hypertension who presents for 12 month follow-up.   She was admitted to the hospital 07/2018 with intermittent chest pain. Diagnostic cardiac catheterization with moderate nonobstructive LAD disease estimated at 60% with FFR 0.82, otherwise normal LCx and RCA. LVEF 55-65% with normal LVEDP and no aortic stenosis. She was recommended for medical therapy. Previous intolerance to Imdur with headache. She did stop Atorvastatin due to rash but she resumed and rash did not recur. Her Metoprolol has been previously de-escalated to fatigue with improvement in symptoms.    Last seen 01/25/21 and was overall doing well.   Today, the patient has been doing well since the last visit. The patient lost 50lbs with Ozempic. No chest, shortness of breath, LLE, orthopnea, or pnd. She is fairly active at baseline, although does no formal activity. She denies palpitations, ligtheadedness dizziness. BP is low, but this is normal for the patient.   Past Medical History:  Diagnosis Date   Coronary artery disease, non-occlusive    a. LHC 07/22/18: LM nl, mLAD 60% w/ FFR 0.83, LCx nl, RCA nl, EF 55-65%, nl LVEDP, no AS.   Depression    Lumbar disc disease    Migraines    Mixed hyperlipidemia     Past Surgical History:  Procedure Laterality Date   ABDOMINAL HYSTERECTOMY  2012   partial for bleeding   BACK SURGERY     BREAST BIOPSY Right    neg   BREAST BIOPSY Left 2015   4 bxs- neg   BREAST SURGERY Left    multiple biopsies   CARDIAC  CATHETERIZATION     CATARACT EXTRACTION Right 12/19/2010   CHOLECYSTECTOMY  1999   COLONOSCOPY WITH PROPOFOL N/A 03/31/2020   Procedure: COLONOSCOPY WITH PROPOFOL;  Surgeon: Midge Minium, MD;  Location: [facility] ENDOSCOPY;  Service: Endoscopy;  Laterality: N/A;   EYE SURGERY     INTRAVASCULAR PRESSURE WIRE/FFR STUDY N/A 07/22/2018   Procedure: INTRAVASCULAR PRESSURE WIRE/FFR STUDY;  Surgeon: Yvonne Kendall, MD;  Location: [facility] INVASIVE CV LAB;  Service: Cardiovascular;  Laterality: N/A;   LEFT HEART CATH AND CORONARY ANGIOGRAPHY N/A 07/22/2018   Procedure: LEFT HEART CATH AND CORONARY ANGIOGRAPHY;  Surgeon: Yvonne Kendall, MD;  Location: [facility] INVASIVE CV LAB;  Service: Cardiovascular;  Laterality: N/A;   POSTERIOR LUMBAR FUSION 4 LEVEL  2000    Current Medications: Current Meds  Medication Sig   albuterol (VENTOLIN HFA) 108 (90 Base) MCG/ACT inhaler INHALE 2 PUFFS INTO THE LUNGS EVERY 4 HOURS AS NEEDED FOR WHEEZE OR FOR SHORTNESS OF BREATH   aspirin EC 81 MG tablet Take 81 mg by mouth daily.   atorvastatin (LIPITOR) 40 MG tablet TAKE 1 TABLET BY MOUTH EVERY DAY   baclofen (LIORESAL) 10 MG tablet Take 1 tablet (10 mg total) by mouth 3 (three) times daily.   buPROPion (WELLBUTRIN XL) 300 MG 24 hr tablet TAKE 1 TABLET BY MOUTH EVERY DAY   clonazePAM (KLONOPIN) 0.25 MG disintegrating tablet TAKE 1 TABLET BY MOUTH TWICE A DAY AS NEEDED   clotrimazole-betamethasone (  LOTRISONE) cream APPLY 1 APPLICATION TOPICALLY 2 (TWO) TIMES DAILY. TO RASH ON LEGS   escitalopram (LEXAPRO) 20 MG tablet TAKE 1 TABLET BY MOUTH EVERY DAY   Esomeprazole Magnesium (NEXIUM PO) Take 20 mg by mouth daily.    fenofibrate (TRICOR) 145 MG tablet Take 1 tablet (145 mg total) by mouth daily.   hydrochlorothiazide (HYDRODIURIL) 25 MG tablet TAKE 1 TABLET (25 MG TOTAL) BY MOUTH DAILY.   ibuprofen (ADVIL) 600 MG tablet Take 1 tablet (600 mg total) by mouth every 6 (six) hours as needed.   Lidocaine 4 % PTCH Apply 1 patch topically  daily as needed (pain).    metFORMIN (GLUCOPHAGE) 500 MG tablet TAKE 1 TABLET BY MOUTH 2 TIMES DAILY WITH A MEAL.   Multiple Vitamin (MULTIVITAMIN) capsule Take 1 capsule by mouth daily.   nitroGLYCERIN (NITROSTAT) 0.4 MG SL tablet Place 1 tablet (0.4 mg total) under the tongue every 5 (five) minutes as needed for chest pain.   ondansetron (ZOFRAN-ODT) 4 MG disintegrating tablet Take 1 tablet (4 mg total) by mouth every 8 (eight) hours as needed for nausea or vomiting.   Semaglutide (OZEMPIC, 1 MG/DOSE, [place]) Inject 1 mg into the skin once a week.   SUMAtriptan (IMITREX) 100 MG tablet TAKE 1 TABLET (100 MG TOTAL) BY MOUTH AS NEEDED FOR MIGRAINE. MAY REPEAT IN 2 HOURS IF NEEDED   topiramate (TOPAMAX) 25 MG tablet TAKE 1-4 TABLETS (25-100 MG TOTAL) BY MOUTH AT BEDTIME.   triamcinolone (KENALOG) 0.025 % ointment Apply 1 application topically as needed. For rash     Allergies:   Oxycodone   Social History   Socioeconomic History   Marital status: Married    Spouse name: Not on file   Number of children: Not on file   Years of education: Not on file   Highest education level: Not on file  Occupational History   Occupation: [employer]  Tobacco Use   Smoking status: Former    Packs/day: 0.25    Years: 40.00    Pack years: 10.00    Types: Cigarettes    Quit date: 09/18/2019    Years since quitting: 2.6   Smokeless tobacco: Never  Vaping Use   Vaping Use: Never used  Substance and Sexual Activity   Alcohol use: Yes    Comment: less than once a week.   Drug use: Never   Sexual activity: Yes    Birth control/protection: Surgical  Other Topics Concern   Not on file  Social History Narrative   Not on file   Social Determinants of Health   Financial Resource Strain: Not on file  Food Insecurity: Not on file  Transportation Needs: Not on file  Physical Activity: Not on file  Stress: Not on file  Social Connections: Not on file     Family History: The patient's family history  includes Alcohol abuse in her father; Breast cancer in her maternal grandmother and paternal grandmother; Breast cancer (age of onset: 5950) in her mother; Heart attack (age of onset: 450) in her father.  ROS:   Please see the history of present illness.     All other systems reviewed and are negative.  EKGs/Labs/Other Studies Reviewed:    The following studies were reviewed today:  Cardiac CTA 05/2021   1. Left Main:  No significant stenosis.   2. LAD: No significant stenosis.  Mid LAD with FFRct 0.88 3. LCX: No significant stenosis.  FFRct 0.87 4. RCA: No significant stenosis.  FFRct 0.93  IMPRESSION: 1.  CT FFR analysis didn't show any significant stenosis.     Electronically Signed   By: Debbe Odea M.D.   On: 03/16/2021 10:34   LHC 2019 Conclusion  Conclusions: Moderate focal mid LAD stenosis, that is not hemodynamically significant by iFR/FFR. No significant coronary artery disease involving the LCx or RCA. Normal left ventricular systolic function and filling pressure.   Recommendations: Optimize medical therapy.  Will continue metoprolol succinate 12.5 mg daily and add isosorbide mononitrate 15 mg daily. Initiate atorvastatin 40 mg daily to prevent progression of CAD. Follow-up as an outpatient in 2 to 4 weeks to reassess symptoms. Seed with CT of the chest and further work-up of recently diagnosed lung nodule.   Recommend Aspirin 81mg  daily for moderate CAD.    , MD [facility] Pager: [phone]  EKG:  EKG is  ordered today.  The ekg ordered today demonstrates NSR 72bpm, nonspecific T wave changes  Recent Labs: 12/05/2021: TSH 4.040 05/08/2022: ALT 37; BUN 16; Creatinine, Ser 1.27; Hemoglobin 14.6; Platelets 491; Potassium 3.3; Sodium 141  Recent Lipid Panel    Component Value Date/Time   CHOL 166 12/05/2021 1405   TRIG 337 (H) 12/05/2021 1405   HDL 38 (L) 12/05/2021 1405   CHOLHDL 4.4 12/05/2021 1405   LDLCALC 74 12/05/2021  1405   LDLDIRECT 48 09/02/2019 0902     Physical Exam:    VS:  BP 92/60 (BP Location: Left Arm, Patient Position: Sitting, Cuff Size: Normal)   Pulse 72   Ht 5\' 3"  (1.6 m)   Wt 150 lb (68 kg)   SpO2 96%   BMI 26.57 kg/m     Wt Readings from Last 3 Encounters:  05/16/22 150 lb (68 kg)  05/08/22 150 lb (68 kg)  01/16/22 191 lb (86.6 kg)     GEN:  Well nourished, well developed in no acute distress HEENT: Normal NECK: No JVD; No carotid bruits LYMPHATICS: No lymphadenopathy CARDIAC: RRR, no murmurs, rubs, gallops RESPIRATORY:  Clear to auscultation without rales, wheezing or rhonchi  ABDOMEN: Soft, non-tender, non-distended MUSCULOSKELETAL:  No edema; No deformity  SKIN: Warm and dry NEUROLOGIC:  Alert and oriented x 3 PSYCHIATRIC:  Normal affect   ASSESSMENT:    1. Coronary artery disease of native artery of native heart with stable angina pectoris (HCC)   2. Essential hypertension   3. Hyperlipidemia, mixed    PLAN:    In order of problems listed above:  Nonobstructive CAD Nonobstructive CAD by Cardiac CTA 02/2021. No anginal symptoms reported. She has lost about 50lbs with Ozempic and is fairly active at baseline. Continue Aspirin and Lipitor. No BB with hypotension. No further ischemic work-up indicated.  HLD Lipid profile 11/2021 showed HDL 38, TG 337, LDL 74, total chol 166. We will start fenofibrate today. Continue Lipitor.   HTN Bp soft today, however patient says it's normal. She denies lightheadedness or dizziness. It is OK to stop HCTZ if BP is lower or patient becomes symptomatic.   Disposition: Follow up in 1 year(s) with MD/APP    Signed, Ogle Hoeffner 03/2021, PA-C  05/18/2022 12:03 PM    [facility]

## 2022-05-16 NOTE — Patient Instructions (Signed)
Medication Instructions:  Your physician has recommended you make the following change in your medication:   START fenofibrate 145 mg daily   *If you need a refill on your cardiac medications before your next appointment, please call your pharmacy*   Lab Work: None ordered  If you have labs (blood work) drawn today and your tests are completely normal, you will receive your results only by: MyChart Message (if you have MyChart) OR A paper copy in the mail If you have any lab test that is abnormal or we need to change your treatment, we will call you to review the results.   Testing/Procedures: None ordered   Follow-Up: At [facility], you and your health needs are our priority.  As part of our continuing mission to provide you with exceptional heart care, we have created designated Provider Care Teams.  These Care Teams include your primary Cardiologist (physician) and Advanced Practice Providers (APPs -  Physician Assistants and Nurse Practitioners) who all work together to provide you with the care you need, when you need it.  We recommend signing up for the patient portal called "MyChart".  Sign up information is provided on this After Visit Summary.  MyChart is used to connect with patients for Virtual Visits (Telemedicine).  Patients are able to view lab/test results, encounter notes, upcoming appointments, etc.  Non-urgent messages can be sent to your provider as well.   To learn more about what you can do with MyChart, go to [email or web address].    Your next appointment:    Your physician wants you to follow-up in: 1 year. You will receive a reminder letter in the mail two months in advance. If you don't receive a letter, please call our office to schedule the follow-up appointment.   The format for your next appointment:   In Person  Provider:   You may see Yvonne Kendall, MD or one of the following Advanced Practice Providers on your designated Care Team:    Nicolasa Ducking, NP Eula Listen, PA-C Cadence Fransico Michael, [place]   Other Instructions N/A  Important Information About Sugar

## 2022-05-20 ENCOUNTER — Encounter: Payer: Self-pay | Admitting: Internal Medicine

## 2022-05-20 ENCOUNTER — Ambulatory Visit (INDEPENDENT_AMBULATORY_CARE_PROVIDER_SITE_OTHER): Payer: 59 | Admitting: Internal Medicine

## 2022-05-20 VITALS — BP 110/84 | HR 82 | Ht 63.0 in | Wt 152.0 lb

## 2022-05-20 DIAGNOSIS — F418 Other specified anxiety disorders: Secondary | ICD-10-CM | POA: Diagnosis not present

## 2022-05-20 DIAGNOSIS — R1013 Epigastric pain: Secondary | ICD-10-CM

## 2022-05-20 DIAGNOSIS — I10 Essential (primary) hypertension: Secondary | ICD-10-CM | POA: Diagnosis not present

## 2022-05-20 MED ORDER — CLONAZEPAM 0.25 MG PO TBDP: 0.2500 mg | ORAL_TABLET | Freq: Two times a day (BID) | ORAL | 1 refills | Status: DC | PRN

## 2022-05-20 NOTE — Patient Instructions (Signed)
Hold the HCTZ until eating better.

## 2022-05-20 NOTE — Progress Notes (Signed)
Date:  05/20/2022   Name:  Nancy Skinner   DOB:  Mar 05, 1966   MRN:  [id number]   Chief Complaint: Abdominal Pain  Abdominal Pain This is a chronic problem. The problem occurs daily. The problem has been gradually improving. The pain is located in the epigastric region. The pain is mild. The quality of the pain is cramping. The abdominal pain does not radiate. Associated symptoms include vomiting (after eating heavy foods). Pertinent negatives include no constipation, diarrhea or fever. The pain is aggravated by eating. Treatments tried: bland foods, soup, jello. Prior diagnostic workup includes ultrasound (was normal).  Hypertension The problem has been rapidly improving since onset. The problem is controlled. Pertinent negatives include no chest pain or shortness of breath. Past treatments include diuretics. The current treatment provides significant improvement. Hypertensive end-organ damage includes CAD/MI. There is no history of kidney disease or CVA.  Depression        This is a chronic problem.The problem is unchanged.  Associated symptoms include no fatigue.  Past treatments include SSRIs - Selective serotonin reuptake inhibitors and other medications.  Compliance with treatment is good.  Previous treatment provided moderate relief.  Lab Results  Component Value Date   NA 141 05/08/2022   K 3.3 (L) 05/08/2022   CO2 31 (H) 05/08/2022   GLUCOSE 111 (H) 05/08/2022   BUN 16 05/08/2022   CREATININE 1.27 (H) 05/08/2022   CALCIUM 10.6 (H) 05/08/2022   EGFR 50 (L) 05/08/2022   GFRNONAA 87 09/11/2020   Lab Results  Component Value Date   CHOL 166 12/05/2021   HDL 38 (L) 12/05/2021   LDLCALC 74 12/05/2021   LDLDIRECT 48 09/02/2019   TRIG 337 (H) 12/05/2021   CHOLHDL 4.4 12/05/2021   Lab Results  Component Value Date   TSH 4.040 12/05/2021   Lab Results  Component Value Date   HGBA1C 8.0 (H) 01/18/2022   Lab Results  Component Value Date   WBC 15.6 (H) 05/08/2022   HGB  14.6 05/08/2022   HCT 41.6 05/08/2022   MCV 85 05/08/2022   PLT 491 (H) 05/08/2022   Lab Results  Component Value Date   ALT 37 (H) 05/08/2022   AST 34 05/08/2022   ALKPHOS 108 05/08/2022   BILITOT 0.7 05/08/2022   No results found for: 25OHVITD2, 25OHVITD3, VD25OH   Review of Systems  Constitutional:  Negative for chills, fatigue, fever and unexpected weight change.  Respiratory:  Negative for chest tightness and shortness of breath.   Cardiovascular:  Negative for chest pain.  Gastrointestinal:  Positive for abdominal pain and vomiting (after eating heavy foods). Negative for constipation and diarrhea.  Psychiatric/Behavioral:  Positive for depression. Negative for dysphoric mood and sleep disturbance. The patient is nervous/anxious.    Patient Active Problem List   Diagnosis Date Noted   Status post hysterectomy 03/05/2022   Essential hypertension 04/16/2021   Hepatic steatosis 03/19/2021   Carpal tunnel syndrome of right wrist 07/10/2020   Screening for colon cancer    Coronary artery disease of native artery of native heart with stable angina pectoris ([place]) 80/99/8338   Neutrophilic leukocytosis 25/04/3975   Pulmonary nodule less than 6 cm determined by computed tomography of lung 07/30/2018   Night sweats 07/16/2018   Neck muscle spasm 07/15/2018   Mixed hyperlipidemia 07/15/2018   Tobacco use disorder, moderate, in sustained remission 07/15/2018   Migraine without aura and without status migrainosus, not intractable 06/24/2018   Gastroesophageal reflux disease without esophagitis 06/24/2018  Chronic midline low back pain without sciatica 06/24/2018    Allergies  Allergen Reactions   Oxycodone Other (See Comments)    "Black outs" and confusion    Past Surgical History:  Procedure Laterality Date   ABDOMINAL HYSTERECTOMY  2012   partial for bleeding   BACK SURGERY     BREAST BIOPSY Right    neg   BREAST BIOPSY Left 2015   4 bxs- neg   BREAST SURGERY Left     multiple biopsies   CARDIAC CATHETERIZATION     CATARACT EXTRACTION Right 12/19/2010   CHOLECYSTECTOMY  1999   COLONOSCOPY WITH PROPOFOL N/A 03/31/2020   Procedure: COLONOSCOPY WITH PROPOFOL;  Surgeon: Lucilla Lame, MD;  Location: [facility] ENDOSCOPY;  Service: Endoscopy;  Laterality: N/A;   EYE SURGERY     INTRAVASCULAR PRESSURE WIRE/FFR STUDY N/A 07/22/2018   Procedure: INTRAVASCULAR PRESSURE WIRE/FFR STUDY;  Surgeon: Nelva Bush, MD;  Location: [place] CV LAB;  Service: Cardiovascular;  Laterality: N/A;   LEFT HEART CATH AND CORONARY ANGIOGRAPHY N/A 07/22/2018   Procedure: LEFT HEART CATH AND CORONARY ANGIOGRAPHY;  Surgeon: Nelva Bush, MD;  Location: [place] CV LAB;  Service: Cardiovascular;  Laterality: N/A;   POSTERIOR LUMBAR FUSION 4 LEVEL  2000    Social History   Tobacco Use   Smoking status: Former    Packs/day: 0.25    Years: 40.00    Pack years: 10.00    Types: Cigarettes    Quit date: 09/18/2019    Years since quitting: 2.6   Smokeless tobacco: Never  Vaping Use   Vaping Use: Never used  Substance Use Topics   Alcohol use: Yes    Comment: less than once a week.   Drug use: Never     Medication list has been reviewed and updated.  Current Meds  Medication Sig   albuterol (VENTOLIN HFA) 108 (90 Base) MCG/ACT inhaler INHALE 2 PUFFS INTO THE LUNGS EVERY 4 HOURS AS NEEDED FOR WHEEZE OR FOR SHORTNESS OF BREATH   aspirin EC 81 MG tablet Take 81 mg by mouth daily.   atorvastatin (LIPITOR) 40 MG tablet TAKE 1 TABLET BY MOUTH EVERY DAY   baclofen (LIORESAL) 10 MG tablet Take 1 tablet (10 mg total) by mouth 3 (three) times daily.   buPROPion (WELLBUTRIN XL) 300 MG 24 hr tablet TAKE 1 TABLET BY MOUTH EVERY DAY   clonazePAM (KLONOPIN) 0.25 MG disintegrating tablet TAKE 1 TABLET BY MOUTH TWICE A DAY AS NEEDED   clotrimazole-betamethasone (LOTRISONE) cream APPLY 1 APPLICATION TOPICALLY 2 (TWO) TIMES DAILY. TO RASH ON LEGS   escitalopram (LEXAPRO) 20 MG tablet  TAKE 1 TABLET BY MOUTH EVERY DAY   Esomeprazole Magnesium (NEXIUM PO) Take 20 mg by mouth daily.    fenofibrate (TRICOR) 145 MG tablet Take 1 tablet (145 mg total) by mouth daily.   hydrochlorothiazide (HYDRODIURIL) 25 MG tablet TAKE 1 TABLET (25 MG TOTAL) BY MOUTH DAILY.   ibuprofen (ADVIL) 600 MG tablet Take 1 tablet (600 mg total) by mouth every 6 (six) hours as needed.   Lidocaine 4 % PTCH Apply 1 patch topically daily as needed (pain).    metFORMIN (GLUCOPHAGE) 500 MG tablet TAKE 1 TABLET BY MOUTH 2 TIMES DAILY WITH A MEAL.   Multiple Vitamin (MULTIVITAMIN) capsule Take 1 capsule by mouth daily.   ondansetron (ZOFRAN-ODT) 4 MG disintegrating tablet Take 1 tablet (4 mg total) by mouth every 8 (eight) hours as needed for nausea or vomiting.   Semaglutide (OZEMPIC, 1 MG/DOSE,  [place]) Inject 1 mg into the skin once a week.   SUMAtriptan (IMITREX) 100 MG tablet TAKE 1 TABLET (100 MG TOTAL) BY MOUTH AS NEEDED FOR MIGRAINE. MAY REPEAT IN 2 HOURS IF NEEDED   topiramate (TOPAMAX) 25 MG tablet TAKE 1-4 TABLETS (25-100 MG TOTAL) BY MOUTH AT BEDTIME.   triamcinolone (KENALOG) 0.025 % ointment Apply 1 application topically as needed. For rash       05/20/2022    2:08 PM 01/16/2022    8:07 AM 12/05/2021    1:29 PM 04/16/2021    1:33 PM  GAD 7 : Generalized Anxiety Score  Nervous, Anxious, on Edge _0 Control/stop worrying _1 Worry too much - different things _2 Trouble relaxing _3 Restless _4 Easily annoyed or irritable _5 Afraid - awful might happen 0 1 1 0  Total GAD 7 Score _6 Anxiety Difficulty Not difficult at all   Somewhat difficult       05/20/2022    2:07 PM  Depression screen PHQ 2/9  Decreased Interest 3  Down, Depressed, Hopeless 2  PHQ - 2 Score 5  Altered sleeping 0  Tired, decreased energy 3  Change in appetite 0  Feeling bad or failure about yourself  1  Trouble concentrating 0  Moving slowly or fidgety/restless 0  Suicidal  thoughts 0  PHQ-9 Score 9  Difficult doing work/chores Not difficult at all    BP Readings from Last 3 Encounters:  05/20/22 110/84  05/16/22 92/60  05/08/22 (!) 122/58    Physical Exam Vitals and nursing note reviewed.  Constitutional:      General: She is not in acute distress.    Appearance: She is well-developed.  HENT:     Head: Normocephalic and atraumatic.  Cardiovascular:     Rate and Rhythm: Normal rate and regular rhythm.  Pulmonary:     Effort: Pulmonary effort is normal. No respiratory distress.     Breath sounds: Normal breath sounds.  Abdominal:     General: Abdomen is flat. Bowel sounds are normal.     Palpations: Abdomen is soft.     Tenderness: There is abdominal tenderness in the epigastric area. There is no guarding or rebound.  Skin:    General: Skin is warm and dry.     Findings: No rash.  Neurological:     Mental Status: She is alert and oriented to person, place, and time.  Psychiatric:        Mood and Affect: Mood normal.        Behavior: Behavior normal.    Wt Readings from Last 3 Encounters:  05/20/22 152 lb (68.9 kg)  05/16/22 150 lb (68 kg)  05/08/22 150 lb (68 kg)    BP 110/84   Pulse 82   Ht _7  (1.6 m)   Wt 152 lb (68.9 kg)   SpO2 95%   BMI 26.93 kg/m   Assessment and Plan: 1. Epigastric pain Now 4 weeks after stopping Ozempic 2 mg. Suspect the pain is now from repeated emesis. [place] was normal and reassuring. Weight loss has stabilized. Labs were normal. Continue bland diet, push fluids and follow up if worsening or failing to improve  2. Essential hypertension Now off metoprolol. Can hold HCTZ until eating better or if BP increases.  3. Depression with anxiety Stable on bupropion and Lexapro  With clonazepam PRN - clonazePAM (KLONOPIN) 0.25 MG disintegrating tablet; Take 1 tablet (0.25 mg total) by mouth 2 (two) times daily as needed.  Dispense: 60 tablet; Refill: 1   Partially dictated using Editor, commissioning. Any  errors are unintentional.  Halina Maidens, MD [facility]  05/20/2022

## 2022-07-29 ENCOUNTER — Encounter: Payer: Self-pay | Admitting: Internal Medicine

## 2022-07-29 ENCOUNTER — Ambulatory Visit (INDEPENDENT_AMBULATORY_CARE_PROVIDER_SITE_OTHER): Payer: 59 | Admitting: Internal Medicine

## 2022-07-29 VITALS — BP 112/74 | HR 71 | Ht 63.0 in | Wt 160.0 lb

## 2022-07-29 DIAGNOSIS — E118 Type 2 diabetes mellitus with unspecified complications: Secondary | ICD-10-CM | POA: Diagnosis not present

## 2022-07-29 DIAGNOSIS — I10 Essential (primary) hypertension: Secondary | ICD-10-CM

## 2022-07-29 DIAGNOSIS — L659 Nonscarring hair loss, unspecified: Secondary | ICD-10-CM | POA: Diagnosis not present

## 2022-07-29 NOTE — Progress Notes (Signed)
Date:  07/29/2022   Name:  Nancy Skinner   DOB:  [birth year]   MRN:  [id number]   Chief Complaint: Diabetes and Hypertension  Hypertension This is a chronic problem. The problem is controlled. Pertinent negatives include no chest pain, headaches, palpitations or shortness of breath. Past treatments include diuretics.  Diabetes She presents for her follow-up diabetic visit. She has type 2 diabetes mellitus. Her disease course has been improving. Pertinent negatives for hypoglycemia include no dizziness, headaches or nervousness/anxiousness. Pertinent negatives for diabetes include no chest pain and no fatigue. Current diabetic treatment includes diet and oral agent (monotherapy) (metformin).    Lab Results  Component Value Date   NA 141 05/08/2022   K 3.3 (L) 05/08/2022   CO2 31 (H) 05/08/2022   GLUCOSE 111 (H) 05/08/2022   BUN 16 05/08/2022   CREATININE 1.27 (H) 05/08/2022   CALCIUM 10.6 (H) 05/08/2022   EGFR 50 (L) 05/08/2022   GFRNONAA 87 09/11/2020   Lab Results  Component Value Date   CHOL 166 12/05/2021   HDL 38 (L) 12/05/2021   LDLCALC 74 12/05/2021   LDLDIRECT 48 09/02/2019   TRIG 337 (H) 12/05/2021   CHOLHDL 4.4 12/05/2021   Lab Results  Component Value Date   TSH 4.040 12/05/2021   Lab Results  Component Value Date   HGBA1C 8.0 (H) 01/18/2022   Lab Results  Component Value Date   WBC 15.6 (H) 05/08/2022   HGB 14.6 05/08/2022   HCT 41.6 05/08/2022   MCV 85 05/08/2022   PLT 491 (H) 05/08/2022   Lab Results  Component Value Date   ALT 37 (H) 05/08/2022   AST 34 05/08/2022   ALKPHOS 108 05/08/2022   BILITOT 0.7 05/08/2022   No results found for: "25OHVITD2", "25OHVITD3", "VD25OH"   Review of Systems  Constitutional:  Negative for appetite change, fatigue and fever.  HENT:  Negative for congestion.   Respiratory:  Negative for chest tightness and shortness of breath.   Cardiovascular:  Negative for chest pain and palpitations.  Neurological:   Negative for dizziness, light-headedness and headaches.  Psychiatric/Behavioral:  Negative for sleep disturbance. The patient is not nervous/anxious.     Patient Active Problem List   Diagnosis Date Noted   Status post hysterectomy 03/05/2022   Essential hypertension 04/16/2021   Hepatic steatosis 03/19/2021   Carpal tunnel syndrome of right wrist 07/10/2020   Screening for colon cancer    Coronary artery disease of native artery of native heart with stable angina pectoris ([place]) 05/39/7673   Neutrophilic leukocytosis 41/93/7902   Pulmonary nodule less than 6 cm determined by computed tomography of lung 07/30/2018   Night sweats 07/16/2018   Neck muscle spasm 07/15/2018   Mixed hyperlipidemia 07/15/2018   Tobacco use disorder, moderate, in sustained remission 07/15/2018   Migraine without aura and without status migrainosus, not intractable 06/24/2018   Gastroesophageal reflux disease without esophagitis 06/24/2018   Chronic midline low back pain without sciatica 06/24/2018    Allergies  Allergen Reactions   Oxycodone Other (See Comments)    "Black outs" and confusion    Past Surgical History:  Procedure Laterality Date   ABDOMINAL HYSTERECTOMY  2012   partial for bleeding   BACK SURGERY     BREAST BIOPSY Right    neg   BREAST BIOPSY Left 2015   4 bxs- neg   BREAST SURGERY Left    multiple biopsies   CARDIAC CATHETERIZATION     CATARACT EXTRACTION Right 12/19/2010  CHOLECYSTECTOMY  1999   COLONOSCOPY WITH PROPOFOL N/A 03/31/2020   Procedure: COLONOSCOPY WITH PROPOFOL;  Surgeon: Lucilla Lame, MD;  Location[facility] ENDOSCOPY;  Service: Endoscopy;  Laterality: N/A;   EYE SURGERY     INTRAVASCULAR PRESSURE WIRE/FFR STUDY N/A 07/22/2018   Procedure: INTRAVASCULAR PRESSURE WIRE/FFR STUDY;  Surgeon: Nelva Bush, MD;  Location: [place] [facility];  Service: Cardiovascular;  Laterality: N/A;   LEFT HEART CATH AND CORONARY ANGIOGRAPHY N/A 07/22/2018   Procedure: LEFT HEART CATH  AND CORONARY ANGIOGRAPHY;  Surgeon: Nelva Bush, MD;  Location: [place] [facility];  Service: Cardiovascular;  Laterality: N/A;   POSTERIOR LUMBAR FUSION 4 LEVEL  2000    Social History   Tobacco Use   Smoking status: Former    Packs/day: 0.25    Years: 40.00    Total pack years: 10.00    Types: Cigarettes    Quit date: 09/18/2019    Years since quitting: 2.8   Smokeless tobacco: Never  Vaping Use   Vaping Use: Never used  Substance Use Topics   Alcohol use: Yes    Comment: less than once a week.   Drug use: Never     Medication list has been reviewed and updated.  Current Meds  Medication Sig   albuterol (VENTOLIN HFA) 108 (90 Base) MCG/ACT inhaler INHALE 2 PUFFS INTO THE LUNGS EVERY 4 HOURS AS NEEDED FOR WHEEZE OR FOR SHORTNESS OF BREATH   aspirin EC 81 MG tablet Take 81 mg by mouth daily.   atorvastatin (LIPITOR) 40 MG tablet TAKE 1 TABLET BY MOUTH EVERY DAY   baclofen (LIORESAL) 10 MG tablet Take 1 tablet (10 mg total) by mouth 3 (three) times daily.   buPROPion (WELLBUTRIN XL) 300 MG 24 hr tablet TAKE 1 TABLET BY MOUTH EVERY DAY   clonazePAM (KLONOPIN) 0.25 MG disintegrating tablet Take 1 tablet (0.25 mg total) by mouth 2 (two) times daily as needed.   clotrimazole-betamethasone (LOTRISONE) cream APPLY 1 APPLICATION TOPICALLY 2 (TWO) TIMES DAILY. TO RASH ON LEGS   escitalopram (LEXAPRO) 20 MG tablet TAKE 1 TABLET BY MOUTH EVERY DAY   Esomeprazole Magnesium (NEXIUM PO) Take 20 mg by mouth daily.    fenofibrate (TRICOR) 145 MG tablet Take 1 tablet (145 mg total) by mouth daily.   hydrochlorothiazide (HYDRODIURIL) 25 MG tablet TAKE 1 TABLET (25 MG TOTAL) BY MOUTH DAILY.   ibuprofen (ADVIL) 600 MG tablet Take 1 tablet (600 mg total) by mouth every 6 (six) hours as needed.   Lidocaine 4 % PTCH Apply 1 patch topically daily as needed (pain).    metFORMIN (GLUCOPHAGE) 500 MG tablet TAKE 1 TABLET BY MOUTH 2 TIMES DAILY WITH A MEAL.   Multiple Vitamin (MULTIVITAMIN)  capsule Take 1 capsule by mouth daily.   nitroGLYCERIN (NITROSTAT) 0.4 MG SL tablet Place 1 tablet (0.4 mg total) under the tongue every 5 (five) minutes as needed for chest pain.   ondansetron (ZOFRAN-ODT) 4 MG disintegrating tablet Take 1 tablet (4 mg total) by mouth every 8 (eight) hours as needed for nausea or vomiting.   SUMAtriptan (IMITREX) 100 MG tablet TAKE 1 TABLET (100 MG TOTAL) BY MOUTH AS NEEDED FOR MIGRAINE. MAY REPEAT IN 2 HOURS IF NEEDED   topiramate (TOPAMAX) 25 MG tablet TAKE 1-4 TABLETS (25-100 MG TOTAL) BY MOUTH AT BEDTIME.   triamcinolone (KENALOG) 0.025 % ointment Apply 1 application topically as needed. For rash       07/29/2022    2:46 PM 05/20/2022  2:08 PM 01/16/2022    8:07 AM 12/05/2021    1:29 PM  GAD 7 : Generalized Anxiety Score  Nervous, Anxious, on Edge 0 3 3 3   Control/stop worrying 2 2 3 3   Worry too much - different things 2 2 3 3   Trouble relaxing 2 2 3 3   Restless 2 2 3 3   Easily annoyed or irritable 3 3 3 3   Afraid - awful might happen 0 0 1 1  Total GAD 7 Score 11 14 19 19   Anxiety Difficulty Not difficult at all Not difficult at all         07/29/2022    2:45 PM 05/20/2022    2:07 PM 01/16/2022    8:07 AM  Depression screen PHQ 2/9  Decreased Interest 3 3 3   Down, Depressed, Hopeless 2 2 3   PHQ - 2 Score 5 5 6   Altered sleeping 0 0 3  Tired, decreased energy 3 3 3   Change in appetite 0 0 3  Feeling bad or failure about yourself  1 1 3   Trouble concentrating 0 0 0  Moving slowly or fidgety/restless 0 0 0  Suicidal thoughts 0 0 0  PHQ-9 Score 9 9 18   Difficult doing work/chores Not difficult at all Not difficult at all     BP Readings from Last 3 Encounters:  07/29/22 112/74  05/20/22 110/84  05/16/22 92/60    Physical Exam Vitals and nursing note reviewed.  Constitutional:      General: She is not in acute distress.    Appearance: She is well-developed.  HENT:     Head: Normocephalic and atraumatic.  Cardiovascular:     Rate  and Rhythm: Normal rate and regular rhythm.     Pulses: Normal pulses.     Heart sounds: No murmur heard. Pulmonary:     Effort: Pulmonary effort is normal. No respiratory distress.  Musculoskeletal:     Cervical back: Normal range of motion.     Right lower leg: No edema.     Left lower leg: No edema.  Skin:    General: Skin is warm and dry.     Capillary Refill: Capillary refill takes less than 2 seconds.     Findings: No rash.  Neurological:     General: No focal deficit present.     Mental Status: She is alert and oriented to person, place, and time.  Psychiatric:        Mood and Affect: Mood normal.        Behavior: Behavior normal.     Wt Readings from Last 3 Encounters:  07/29/22 160 lb (72.6 kg)  05/20/22 152 lb (68.9 kg)  05/16/22 150 lb (68 kg)    BP 112/74   Pulse 71   Ht 5' 3"  (1.6 m)   Wt 160 lb (72.6 kg)   SpO2 96%   BMI 28.34 kg/m   Assessment and Plan: 1. Essential hypertension Clinically stable exam with well controlled BP. Tolerating medications without side effects at this time. Pt to continue current regimen and low sodium diet; benefits of regular exercise as able discussed. - CBC with Differential/Platelet - TSH + free T4  2. Type II diabetes mellitus with complication (HCC) Clinically stable by exam and report without s/s of hypoglycemia. DM complicated by hypertension and dyslipidemia. Tolerating medications well without side effects or other concerns. - Comprehensive metabolic panel - Hemoglobin A1c - Microalbumin / creatinine urine ratio  3. Non-scarring hair loss Likely due to  recent dramatic weight loss Continue skin/hair/nails vitamins Should reverse over the next 3-6 months   Partially dictated using Editor, commissioning. Any errors are unintentional.  Halina Maidens, MD [facility]  07/29/2022

## 2022-07-30 LAB — COMPREHENSIVE METABOLIC PANEL
ALT: 18 IU/L (ref 0–32)
AST: 20 IU/L (ref 0–40)
Albumin/Globulin Ratio: 2 (ref 1.2–2.2)
Albumin: 4.2 g/dL (ref 3.8–4.9)
Alkaline Phosphatase: 37 IU/L — ABNORMAL LOW (ref 44–121)
BUN/Creatinine Ratio: 19 (ref 9–23)
BUN: 18 mg/dL (ref 6–24)
Bilirubin Total: <0.2 mg/dL (ref 0.0–1.2)
CO2: 24 mmol/L (ref 20–29)
Calcium: 9.7 mg/dL (ref 8.7–10.2)
Chloride: 99 mmol/L (ref 96–106)
Creatinine, Ser: 0.96 mg/dL (ref 0.57–1.00)
Globulin, Total: 2.1 g/dL (ref 1.5–4.5)
Glucose: 99 mg/dL (ref 70–99)
Potassium: 3.3 mmol/L — ABNORMAL LOW (ref 3.5–5.2)
Sodium: 137 mmol/L (ref 134–144)
Total Protein: 6.3 g/dL (ref 6.0–8.5)
eGFR: 70 mL/min/{1.73_m2} (ref >59)

## 2022-07-30 LAB — CBC WITH DIFFERENTIAL/PLATELET
Basophils Absolute: 0.1 10*3/uL (ref 0.0–0.2)
Basos: 1 %
EOS (ABSOLUTE): 0.3 10*3/uL (ref 0.0–0.4)
Eos: 3 %
Hematocrit: 34.9 % (ref 34.0–46.6)
Hemoglobin: 11.7 g/dL (ref 11.1–15.9)
Immature Grans (Abs): 0.1 10*3/uL (ref 0.0–0.1)
Immature Granulocytes: 1 %
Lymphocytes Absolute: 4.7 10*3/uL — ABNORMAL HIGH (ref 0.7–3.1)
Lymphs: 43 %
MCH: 29.3 pg (ref 26.6–33.0)
MCHC: 33.5 g/dL (ref 31.5–35.7)
MCV: 87 fL (ref 79–97)
Monocytes Absolute: 0.7 10*3/uL (ref 0.1–0.9)
Monocytes: 7 %
Neutrophils Absolute: 5.2 10*3/uL (ref 1.4–7.0)
Neutrophils: 45 %
Platelets: 425 10*3/uL (ref 150–450)
RBC: 4 x10E6/uL (ref 3.77–5.28)
RDW: 13.4 % (ref 11.7–15.4)
WBC: 11.1 10*3/uL — ABNORMAL HIGH (ref 3.4–10.8)

## 2022-07-30 LAB — HEMOGLOBIN A1C
Est. average glucose Bld gHb Est-mCnc: 117 mg/dL
Hgb A1c MFr Bld: 5.7 % — ABNORMAL HIGH (ref 4.8–5.6)

## 2022-07-30 LAB — MICROALBUMIN / CREATININE URINE RATIO
Creatinine, Urine: 110.6 mg/dL
Microalb/Creat Ratio: 4 mg/g creat (ref 0–29)
Microalbumin, Urine: 4.2 ug/mL

## 2022-07-30 LAB — TSH+FREE T4
Free T4: 1.02 ng/dL (ref 0.82–1.77)
TSH: 3.32 u[IU]/mL (ref 0.450–4.500)

## 2022-07-31 ENCOUNTER — Other Ambulatory Visit: Payer: Self-pay

## 2022-07-31 DIAGNOSIS — D72829 Elevated white blood cell count, unspecified: Secondary | ICD-10-CM

## 2022-08-06 ENCOUNTER — Inpatient Hospital Stay: Payer: 59 | Admitting: Oncology

## 2022-08-06 ENCOUNTER — Inpatient Hospital Stay: Payer: 59

## 2022-08-07 ENCOUNTER — Inpatient Hospital Stay: Payer: 59

## 2022-08-07 ENCOUNTER — Inpatient Hospital Stay: Payer: 59 | Attending: Oncology | Admitting: Oncology

## 2022-08-07 ENCOUNTER — Encounter: Payer: Self-pay | Admitting: Oncology

## 2022-08-07 VITALS — BP 125/67 | HR 66 | Resp 18 | Ht 63.0 in | Wt 163.0 lb

## 2022-08-07 DIAGNOSIS — Z90710 Acquired absence of both cervix and uterus: Secondary | ICD-10-CM | POA: Diagnosis not present

## 2022-08-07 DIAGNOSIS — Z803 Family history of malignant neoplasm of breast: Secondary | ICD-10-CM | POA: Insufficient documentation

## 2022-08-07 DIAGNOSIS — D72820 Lymphocytosis (symptomatic): Secondary | ICD-10-CM | POA: Insufficient documentation

## 2022-08-07 DIAGNOSIS — Z87891 Personal history of nicotine dependence: Secondary | ICD-10-CM | POA: Diagnosis not present

## 2022-08-07 LAB — LACTATE DEHYDROGENASE: LDH: 102 U/L (ref 98–192)

## 2022-08-07 LAB — CBC WITH DIFFERENTIAL/PLATELET
Abs Immature Granulocytes: 0.04 10*3/uL (ref 0.00–0.07)
Basophils Absolute: 0.1 10*3/uL (ref 0.0–0.1)
Basophils Relative: 1 %
Eosinophils Absolute: 0.3 10*3/uL (ref 0.0–0.5)
Eosinophils Relative: 3 %
HCT: 38.8 % (ref 36.0–46.0)
Hemoglobin: 12.7 g/dL (ref 12.0–15.0)
Immature Granulocytes: 0 %
Lymphocytes Relative: 45 %
Lymphs Abs: 4 10*3/uL (ref 0.7–4.0)
MCH: 28.5 pg (ref 26.0–34.0)
MCHC: 32.7 g/dL (ref 30.0–36.0)
MCV: 87.2 fL (ref 80.0–100.0)
Monocytes Absolute: 0.7 10*3/uL (ref 0.1–1.0)
Monocytes Relative: 8 %
Neutro Abs: 3.8 10*3/uL (ref 1.7–7.7)
Neutrophils Relative %: 43 %
Platelets: 474 10*3/uL — ABNORMAL HIGH (ref 150–400)
RBC: 4.45 MIL/uL (ref 3.87–5.11)
RDW: 14.6 % (ref 11.5–15.5)
WBC: 8.9 10*3/uL (ref 4.0–10.5)
nRBC: 0 % (ref 0.0–0.2)

## 2022-08-07 LAB — TECHNOLOGIST SMEAR REVIEW: Plt Morphology: INCREASED

## 2022-08-08 DIAGNOSIS — D72829 Elevated white blood cell count, unspecified: Secondary | ICD-10-CM | POA: Insufficient documentation

## 2022-08-08 HISTORY — DX: Elevated white blood cell count, unspecified: D72.829

## 2022-08-08 LAB — KAPPA/LAMBDA LIGHT CHAINS
Kappa free light chain: 27.9 mg/L — ABNORMAL HIGH (ref 3.3–19.4)
Kappa, lambda light chain ratio: 1.66 — ABNORMAL HIGH (ref 0.26–1.65)
Lambda free light chains: 16.8 mg/L (ref 5.7–26.3)

## 2022-08-08 NOTE — Assessment & Plan Note (Signed)
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 Check CBC, multiple myeloma panel, light chain ratio, blood cytometry, LDH, smear. Patient will follow-up in 3 to 4 weeks to go over results. Recommend smoke cessation.

## 2022-08-08 NOTE — Progress Notes (Signed)
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 Check CBC, multiple myeloma panel, light chain ratio, blood cytometry, LDH, smear. Patient will follow-up in 3 to 4 weeks to go over results. Recommend smoke cessation.   Orders Placed This Encounter  Procedures   CBC with Differential/Platelet    Standing Status:   Future    Number of Occurrences:   1    Standing Expiration Date:   08/08/2023   Multiple Myeloma Panel (SPEP&IFE w/QIG)    Standing Status:   Future    Number of Occurrences:   1    Standing Expiration Date:   08/08/2023   Kappa/lambda light chains    Standing Status:   Future    Number of Occurrences:   1    Standing Expiration Date:   08/08/2023   Flow cytometry panel-leukemia/lymphoma work-up    Standing Status:   Future    Number of Occurrences:   1    Standing Expiration Date:   08/08/2023   Lactate dehydrogenase    Standing Status:   Future    Number of Occurrences:   1    Standing Expiration Date:   08/08/2023   Technologist smear review    Order Specific Question:   Clinical information:    Answer:   leukocytosis     Earlie Server, MD, PhD [facility] Hematology Oncology 08/07/2022       CHIEF COMPLAINTS/PURPOSE OF CONSULTATION:  Leukocytosis/elevate white count  HISTORY OF PRESENTING ILLNESS:  Nancy Skinner 56 y.o. female is here because of elevated WBC.  She was found to have abnormal CBC on 07/29/2022, total white count 11.1, lymphocytosis, absolute lymphocyte 4.7.  Reviewed previous lab history.  Lymphocytosis is chronic. She denies recent infection.  There is not reported symptoms of sinus congestion, cough, urinary frequency/urgency or dysuria, diarrhea, joint swelling/pain or abnormal skin rash.  She had  no prior history or diagnosis of cancer.  Patient is overdue for mammogram. The patient has no prior diagnosis of autoimmune disease and was not prescribed corticosteroids related products. Patient smokes on some days.  A pack last about 2 weeks.  MEDICAL HISTORY:  Past Medical History:  Diagnosis Date   Coronary artery disease, non-occlusive    a. LHC 07/22/18: LM nl, mLAD 60% w/ FFR 0.83, LCx nl, RCA nl, EF 55-65%, nl LVEDP, no AS.   Depression    Lumbar disc disease    Migraines    Mixed hyperlipidemia     SURGICAL HISTORY: Past Surgical History:  Procedure Laterality Date   ABDOMINAL HYSTERECTOMY  2012   partial for bleeding   BACK SURGERY     BREAST BIOPSY Right    neg   BREAST BIOPSY Left 2015   4 bxs- neg   BREAST SURGERY Left    multiple biopsies   CARDIAC CATHETERIZATION     CATARACT EXTRACTION Right 12/19/2010   CHOLECYSTECTOMY  1999   COLONOSCOPY WITH PROPOFOL N/A 03/31/2020   Procedure: COLONOSCOPY WITH PROPOFOL;  Surgeon: Lucilla Lame, MD;  Location: [facility] ENDOSCOPY;  Service: Endoscopy;  Laterality: N/A;   EYE SURGERY     INTRAVASCULAR PRESSURE WIRE/FFR STUDY N/A 07/22/2018   Procedure: INTRAVASCULAR PRESSURE WIRE/FFR STUDY;  Surgeon: Nelva Bush, MD;  Location: [place] [facility];  Service: Cardiovascular;  Laterality: N/A;  LEFT HEART CATH AND CORONARY ANGIOGRAPHY N/A 07/22/2018   Procedure: LEFT HEART CATH AND CORONARY ANGIOGRAPHY;  Surgeon: Nelva Bush, MD;  Location: [place] [facility];  Service: Cardiovascular;  Laterality: N/A;   POSTERIOR LUMBAR FUSION 4 LEVEL  2000    SOCIAL HISTORY: Social History   Socioeconomic History   Marital status: Married    Spouse name: Not on file   Number of children: Not on file   Years of education: Not on file   Highest education level: Not on file  Occupational History   Occupation: [employer]  Tobacco Use   Smoking status: Former    Packs/day: 0.25    Years: 40.00    Total pack years: 10.00    Types:  Cigarettes    Quit date: 09/18/2019    Years since quitting: 2.8   Smokeless tobacco: Never  Vaping Use   Vaping Use: Never used  Substance and Sexual Activity   Alcohol use: Yes    Comment: less than once a week.   Drug use: Never   Sexual activity: Yes    Birth control/protection: Surgical  Other Topics Concern   Not on file  Social History Narrative   Not on file   Social Determinants of Health   Financial Resource Strain: Low Risk  (05/20/2022)   Overall Financial Resource Strain (CARDIA)    Difficulty of Paying Living Expenses: Not hard at all  Food Insecurity: No Food Insecurity (05/20/2022)   Hunger Vital Sign    Worried About Running Out of Food in the Last Year: Never true    Ran Out of Food in the Last Year: Never true  Transportation Needs: No Transportation Needs (05/20/2022)   PRAPARE - Hydrologist (Medical): No    Lack of Transportation (Non-Medical): No  Physical Activity: Not on file  Stress: Not on file  Social Connections: Not on file  Intimate Partner Violence: Not At Risk (05/20/2022)   Humiliation, Afraid, Rape, and Kick questionnaire    Fear of Current or Ex-Partner: No    Emotionally Abused: No    Physically Abused: No    Sexually Abused: No    FAMILY HISTORY: Family History  Problem Relation Age of Onset   Breast cancer Mother 74   Heart attack Father 60   Alcohol abuse Father    Breast cancer Maternal Grandmother    Breast cancer Paternal Grandmother     ALLERGIES:  is allergic to oxycodone.  MEDICATIONS:  Current Outpatient Medications  Medication Sig Dispense Refill   albuterol (VENTOLIN HFA) 108 (90 Base) MCG/ACT inhaler INHALE 2 PUFFS INTO THE LUNGS EVERY 4 HOURS AS NEEDED FOR WHEEZE OR FOR SHORTNESS OF BREATH 6.7 each 0   aspirin EC 81 MG tablet Take 81 mg by mouth daily.     atorvastatin (LIPITOR) 40 MG tablet TAKE 1 TABLET BY MOUTH EVERY DAY 90 tablet 0   baclofen (LIORESAL) 10 MG tablet Take 1 tablet (10  mg total) by mouth 3 (three) times daily. 30 each 0   buPROPion (WELLBUTRIN XL) 300 MG 24 hr tablet TAKE 1 TABLET BY MOUTH EVERY DAY 90 tablet 1   clonazePAM (KLONOPIN) 0.25 MG disintegrating tablet Take 1 tablet (0.25 mg total) by mouth 2 (two) times daily as needed. 60 tablet 1   clotrimazole-betamethasone (LOTRISONE) cream APPLY 1 APPLICATION TOPICALLY 2 (TWO) TIMES DAILY. TO RASH ON LEGS 30 g 0   escitalopram (LEXAPRO) 20 MG tablet TAKE 1 TABLET BY MOUTH EVERY  DAY 90 tablet 1   Esomeprazole Magnesium (NEXIUM PO) Take 20 mg by mouth daily.      fenofibrate (TRICOR) 145 MG tablet Take 1 tablet (145 mg total) by mouth daily. 90 tablet 3   hydrochlorothiazide (HYDRODIURIL) 25 MG tablet TAKE 1 TABLET (25 MG TOTAL) BY MOUTH DAILY. 90 tablet 1   ibuprofen (ADVIL) 600 MG tablet Take 1 tablet (600 mg total) by mouth every 6 (six) hours as needed. 30 tablet 0   Lidocaine 4 % PTCH Apply 1 patch topically daily as needed (pain).      metFORMIN (GLUCOPHAGE) 500 MG tablet TAKE 1 TABLET BY MOUTH 2 TIMES DAILY WITH A MEAL. 180 tablet 1   Multiple Vitamin (MULTIVITAMIN) capsule Take 1 capsule by mouth daily.     ondansetron (ZOFRAN-ODT) 4 MG disintegrating tablet Take 1 tablet (4 mg total) by mouth every 8 (eight) hours as needed for nausea or vomiting. 40 tablet 0   SUMAtriptan (IMITREX) 100 MG tablet TAKE 1 TABLET (100 MG TOTAL) BY MOUTH AS NEEDED FOR MIGRAINE. MAY REPEAT IN 2 HOURS IF NEEDED 9 tablet 6   topiramate (TOPAMAX) 25 MG tablet TAKE 1-4 TABLETS (25-100 MG TOTAL) BY MOUTH AT BEDTIME. 360 tablet 0   triamcinolone (KENALOG) 0.025 % ointment Apply 1 application topically as needed. For rash 30 g 3   nitroGLYCERIN (NITROSTAT) 0.4 MG SL tablet Place 1 tablet (0.4 mg total) under the tongue every 5 (five) minutes as needed for chest pain. 25 tablet 4   Semaglutide (OZEMPIC, 1 MG/DOSE, [place]) Inject 1 mg into the skin once a week. (Patient not taking: Reported on 07/29/2022)     No current  facility-administered medications for this visit.    Review of Systems  Constitutional:  Negative for appetite change, chills, fatigue and fever.  HENT:   Negative for hearing loss and voice change.   Eyes:  Negative for eye problems.  Respiratory:  Negative for chest tightness and cough.   Cardiovascular:  Negative for chest pain.  Gastrointestinal:  Negative for abdominal distention, abdominal pain and blood in stool.  Endocrine: Negative for hot flashes.  Genitourinary:  Negative for difficulty urinating and frequency.   Musculoskeletal:  Negative for arthralgias.  Skin:  Negative for itching and rash.  Neurological:  Negative for extremity weakness.  Hematological:  Negative for adenopathy.  Psychiatric/Behavioral:  Negative for confusion.      PHYSICAL EXAMINATION: ECOG PERFORMANCE STATUS: 0 - Asymptomatic  Vitals:   08/07/22 1525  BP: 125/67  Pulse: 66  Resp: 18  SpO2: 99%   Filed Weights   08/07/22 1521  Weight: 163 lb (73.9 kg)    Physical Exam Constitutional:      General: She is not in acute distress.    Appearance: She is not diaphoretic.  HENT:     Head: Normocephalic and atraumatic.     Nose: Nose normal.     Mouth/Throat:     Pharynx: No oropharyngeal exudate.  Eyes:     General: No scleral icterus.    Pupils: Pupils are equal, round, and reactive to light.  Cardiovascular:     Rate and Rhythm: Normal rate and regular rhythm.     Heart sounds: No murmur heard. Pulmonary:     Effort: Pulmonary effort is normal. No respiratory distress.     Breath sounds: No rales.  Chest:     Chest wall: No tenderness.  Abdominal:     General: There is no distension.     Palpations:  Abdomen is soft.     Tenderness: There is no abdominal tenderness.  Musculoskeletal:        General: Normal range of motion.     Cervical back: Normal range of motion and neck supple.  Skin:    General: Skin is warm and dry.     Findings: No erythema.  Neurological:     Mental  Status: She is alert and oriented to person, place, and time.     Cranial Nerves: No cranial nerve deficit.     Motor: No abnormal muscle tone.     Coordination: Coordination normal.  Psychiatric:        Mood and Affect: Affect normal.     LABORATORY DATA:  I have reviewed the data as listed    Latest Ref Rng & Units 08/07/2022    4:14 PM 07/29/2022    3:51 PM 05/08/2022    2:15 PM  CBC  WBC 4.0 - 10.5 K/uL 8.9  11.1  15.6   Hemoglobin 12.0 - 15.0 g/dL 12.7  11.7  14.6   Hematocrit 36.0 - 46.0 % 38.8  34.9  41.6   Platelets 150 - 400 K/uL 474  425  491       Latest Ref Rng & Units 07/29/2022    3:51 PM 05/08/2022    2:15 PM 01/18/2022    3:13 PM  CMP  Glucose 70 - 99 mg/dL 99  111  112   BUN 6 - 24 mg/dL _0 Creatinine 0.57 - 1.00 mg/dL 0.96  1.27  0.98   Sodium 134 - 144 mmol/L 137  141  141   Potassium 3.5 - 5.2 mmol/L 3.3  3.3  2.9   Chloride 96 - 106 mmol/L 99  89  91   CO2 20 - 29 mmol/L _1 Calcium 8.7 - 10.2 mg/dL 9.7  10.6  9.5   Total Protein 6.0 - 8.5 g/dL 6.3  7.4  6.9   Total Bilirubin 0.0 - 1.2 mg/dL <0.2  0.7  0.3   Alkaline Phos 44 - 121 IU/L 37  108  119   AST 0 - 40 IU/L 20  34  49   ALT 0 - 32 IU/L 18  37  42    Lab Results  Component Value Date   LDH 102 08/07/2022    RADIOGRAPHIC STUDIES: I have personally reviewed the radiological images as listed and agreed with the findings in the report. No results found.

## 2022-08-09 ENCOUNTER — Encounter: Payer: 59 | Admitting: Oncology

## 2022-08-09 ENCOUNTER — Other Ambulatory Visit: Payer: 59

## 2022-08-11 LAB — COMP PANEL: LEUKEMIA/LYMPHOMA

## 2022-08-12 LAB — MULTIPLE MYELOMA PANEL, SERUM
Albumin SerPl Elph-Mcnc: 3.8 g/dL (ref 2.9–4.4)
Albumin/Glob SerPl: 1.4 (ref 0.7–1.7)
Alpha 1: 0.2 g/dL (ref 0.0–0.4)
Alpha2 Glob SerPl Elph-Mcnc: 0.7 g/dL (ref 0.4–1.0)
B-Globulin SerPl Elph-Mcnc: 1.1 g/dL (ref 0.7–1.3)
Gamma Glob SerPl Elph-Mcnc: 0.8 g/dL (ref 0.4–1.8)
Globulin, Total: 2.8 g/dL (ref 2.2–3.9)
IgA: 223 mg/dL (ref 87–352)
IgG (Immunoglobin G), Serum: 780 mg/dL (ref 586–1602)
IgM (Immunoglobulin M), Srm: 177 mg/dL (ref 26–217)
Total Protein ELP: 6.6 g/dL (ref 6.0–8.5)

## 2022-08-14 ENCOUNTER — Telehealth: Payer: Self-pay | Admitting: Oncology

## 2022-08-14 NOTE — Telephone Encounter (Signed)
Patient called "very concerned" about her lab results and would like a call back with results. She has not been able to get into her MyChart. While on the phone, I sent her an updated text link and provided her with instructions to create her account.

## 2022-08-14 NOTE — Telephone Encounter (Signed)
Patient has follow up to discuss lab results on 9/22. Please advise if MD appt needs to be moved up or we can relay a message to her prior to appt.

## 2022-08-15 ENCOUNTER — Other Ambulatory Visit: Payer: Self-pay

## 2022-08-15 DIAGNOSIS — D72820 Lymphocytosis (symptomatic): Secondary | ICD-10-CM

## 2022-08-15 NOTE — Telephone Encounter (Signed)
Pt informed via Mychart.

## 2022-08-15 NOTE — Telephone Encounter (Signed)
Spoke to pt and informed her of MD recommendation. Pt will come by to pick up urine jug tomorrow.

## 2022-08-20 ENCOUNTER — Other Ambulatory Visit: Payer: Self-pay

## 2022-08-20 ENCOUNTER — Inpatient Hospital Stay: Payer: 59 | Attending: Oncology

## 2022-08-20 DIAGNOSIS — D72829 Elevated white blood cell count, unspecified: Secondary | ICD-10-CM | POA: Diagnosis present

## 2022-08-20 DIAGNOSIS — D72820 Lymphocytosis (symptomatic): Secondary | ICD-10-CM

## 2022-08-22 LAB — IFE+PROTEIN ELECTRO, 24-HR UR
% BETA, Urine: 0 %
ALPHA 1 URINE: 0 %
Albumin, U: 0 %
Alpha 2, Urine: 0 %
GAMMA GLOBULIN URINE: 0 %
Total Protein, Urine-Ur/day: 110 mg/24 hr (ref 30–150)
Total Protein, Urine: 4.8 mg/dL
Total Volume: 2300

## 2022-09-05 ENCOUNTER — Inpatient Hospital Stay: Payer: 59 | Attending: Oncology | Admitting: Oncology

## 2022-09-05 ENCOUNTER — Encounter: Payer: Self-pay | Admitting: Oncology

## 2022-09-05 VITALS — BP 123/74 | HR 65 | Temp 97.8°F | Resp 20 | Wt 161.5 lb

## 2022-09-05 DIAGNOSIS — F17210 Nicotine dependence, cigarettes, uncomplicated: Secondary | ICD-10-CM | POA: Diagnosis not present

## 2022-09-05 DIAGNOSIS — Z7984 Long term (current) use of oral hypoglycemic drugs: Secondary | ICD-10-CM | POA: Insufficient documentation

## 2022-09-05 DIAGNOSIS — Z79899 Other long term (current) drug therapy: Secondary | ICD-10-CM | POA: Diagnosis not present

## 2022-09-05 DIAGNOSIS — Z7982 Long term (current) use of aspirin: Secondary | ICD-10-CM | POA: Insufficient documentation

## 2022-09-05 DIAGNOSIS — D72820 Lymphocytosis (symptomatic): Secondary | ICD-10-CM | POA: Diagnosis present

## 2022-09-05 NOTE — Progress Notes (Signed)
[facility] CONSULT NOTE  Patient Care Team: Reubin MilanBerglund, Laura H, MD as PCP - General (Internal Medicine) End, Cristal Deerhristopher, MD as PCP - Cardiology (Cardiology)  ASSESSMENT & PLAN  Leukocytosis Chronic lymphocytosis, reactive versus secondary to underlying bone marrow disorders. Labs reviewed and discussed patient. Negative M protein on protein electrophoresis, slightly increased light chain ratio, nonspecific.  24-hour urine protein electrophoresis negative for M protein.  Peripheral blood flow cytometry negative.  Likely chronic leukocytosis reactive.  Repeat CBC showed a normalized WBC. Recommend observation. Repeat CBC, autoimmune markers in 3 months.  She agrees with the plan.   Orders Placed This Encounter  Procedures   CBC with Differential/Platelet    Standing Status:   Future    Standing Expiration Date:   09/06/2023   Comprehensive metabolic panel    Standing Status:   Future    Standing Expiration Date:   09/05/2023   ANA w/Reflex    Standing Status:   Future    Standing Expiration Date:   09/06/2023   C-reactive protein    Standing Status:   Future    Standing Expiration Date:   09/06/2023   Sedimentation rate    Standing Status:   Future    Standing Expiration Date:   09/05/2023   Hepatitis B surface antigen    Standing Status:   Future    Standing Expiration Date:   09/06/2023   Hepatitis B core antibody, total    Standing Status:   Future    Standing Expiration Date:   09/06/2023   Follow-up in 3 months. Rickard PatienceZhou Cicero Noy, MD, PhD [facility] 09/05/2022       CHIEF COMPLAINTS/PURPOSE OF CONSULTATION:  Leukocytosis/elevate white count  HISTORY OF PRESENTING ILLNESS:  Nancy Skinner 56 y.o. female is here because of elevated WBC.  She was found to have abnormal CBC on 07/29/2022, total white count 11.1, lymphocytosis, absolute lymphocyte 4.7.  Reviewed previous lab history.  Lymphocytosis is chronic. She denies recent infection.   There is not reported symptoms of sinus congestion, cough, urinary frequency/urgency or dysuria, diarrhea, joint swelling/pain or abnormal skin rash.  She had no prior history or diagnosis of cancer.  Patient is overdue for mammogram. The patient has no prior diagnosis of autoimmune disease and was not prescribed corticosteroids related products. Patient smokes on some days.  A pack last about 2 weeks.  INTERVAL HISTORY Nancy Skinner is a 56 y.o. female who has above history reviewed by me today presents for follow up visit for leukocytosis.  Patient has had blood work done and present to discuss results. She smokes very occasionally.  No other new complaints.   MEDICAL HISTORY:  Past Medical History:  Diagnosis Date   Coronary artery disease, non-occlusive    a. LHC 07/22/18: LM nl, mLAD 60% w/ FFR 0.83, LCx nl, RCA nl, EF 55-65%, nl LVEDP, no AS.   Depression    Lumbar disc disease    Migraines    Mixed hyperlipidemia     SURGICAL HISTORY: Past Surgical History:  Procedure Laterality Date   ABDOMINAL HYSTERECTOMY  2012   partial for bleeding   BACK SURGERY     BREAST BIOPSY Right    neg   BREAST BIOPSY Left 2015   4 bxs- neg   BREAST SURGERY Left    multiple biopsies   CARDIAC CATHETERIZATION     CATARACT EXTRACTION Right 12/19/2010   CHOLECYSTECTOMY  1999   COLONOSCOPY WITH PROPOFOL N/A 03/31/2020   Procedure: COLONOSCOPY  WITH PROPOFOL;  Surgeon: Midge Minium, MD;  Location: [facility] ENDOSCOPY;  Service: Endoscopy;  Laterality: N/A;   EYE SURGERY     INTRAVASCULAR PRESSURE WIRE/FFR STUDY N/A 07/22/2018   Procedure: INTRAVASCULAR PRESSURE WIRE/FFR STUDY;  Surgeon: Yvonne Kendall, MD;  Location: [facility] INVASIVE CV LAB;  Service: Cardiovascular;  Laterality: N/A;   LEFT HEART CATH AND CORONARY ANGIOGRAPHY N/A 07/22/2018   Procedure: LEFT HEART CATH AND CORONARY ANGIOGRAPHY;  Surgeon: Yvonne Kendall, MD;  Location: [facility] INVASIVE CV LAB;  Service: Cardiovascular;  Laterality: N/A;    POSTERIOR LUMBAR FUSION 4 LEVEL  2000    SOCIAL HISTORY: Social History   Socioeconomic History   Marital status: Married    Spouse name: Not on file   Number of children: Not on file   Years of education: Not on file   Highest education level: Not on file  Occupational History   Occupation: [employer]  Tobacco Use   Smoking status: Former    Packs/day: 0.25    Years: 40.00    Total pack years: 10.00    Types: Cigarettes    Quit date: 09/18/2019    Years since quitting: 2.9   Smokeless tobacco: Never  Vaping Use   Vaping Use: Never used  Substance and Sexual Activity   Alcohol use: Yes    Comment: less than once a week.   Drug use: Never   Sexual activity: Yes    Birth control/protection: Surgical  Other Topics Concern   Not on file  Social History Narrative   Not on file   Social Determinants of Health   Financial Resource Strain: Low Risk  (05/20/2022)   Overall Financial Resource Strain (CARDIA)    Difficulty of Paying Living Expenses: Not hard at all  Food Insecurity: No Food Insecurity (05/20/2022)   Hunger Vital Sign    Worried About Running Out of Food in the Last Year: Never true    Ran Out of Food in the Last Year: Never true  Transportation Needs: No Transportation Needs (05/20/2022)   PRAPARE - Administrator, Civil Service (Medical): No    Lack of Transportation (Non-Medical): No  Physical Activity: Not on file  Stress: Not on file  Social Connections: Not on file  Intimate Partner Violence: Not At Risk (05/20/2022)   Humiliation, Afraid, Rape, and Kick questionnaire    Fear of Current or Ex-Partner: No    Emotionally Abused: No    Physically Abused: No    Sexually Abused: No    FAMILY HISTORY: Family History  Problem Relation Age of Onset   Breast cancer Mother 65   Heart attack Father 55   Alcohol abuse Father    Breast cancer Maternal Grandmother    Breast cancer Paternal Grandmother     ALLERGIES:  is allergic to oxycodone and  dye fdc red [red dye].  MEDICATIONS:  Current Outpatient Medications  Medication Sig Dispense Refill   albuterol (VENTOLIN HFA) 108 (90 Base) MCG/ACT inhaler INHALE 2 PUFFS INTO THE LUNGS EVERY 4 HOURS AS NEEDED FOR WHEEZE OR FOR SHORTNESS OF BREATH 6.7 each 0   aspirin EC 81 MG tablet Take 81 mg by mouth daily.     atorvastatin (LIPITOR) 40 MG tablet TAKE 1 TABLET BY MOUTH EVERY DAY 90 tablet 0   baclofen (LIORESAL) 10 MG tablet Take 1 tablet (10 mg total) by mouth 3 (three) times daily. 30 each 0   buPROPion (WELLBUTRIN XL) 300 MG 24 hr tablet TAKE 1 TABLET BY MOUTH  EVERY DAY 90 tablet 1   clonazePAM (KLONOPIN) 0.25 MG disintegrating tablet Take 1 tablet (0.25 mg total) by mouth 2 (two) times daily as needed. 60 tablet 1   clotrimazole-betamethasone (LOTRISONE) cream APPLY 1 APPLICATION TOPICALLY 2 (TWO) TIMES DAILY. TO RASH ON LEGS 30 g 0   escitalopram (LEXAPRO) 20 MG tablet TAKE 1 TABLET BY MOUTH EVERY DAY 90 tablet 1   Esomeprazole Magnesium (NEXIUM PO) Take 20 mg by mouth daily.      fenofibrate (TRICOR) 145 MG tablet Take 1 tablet (145 mg total) by mouth daily. 90 tablet 3   hydrochlorothiazide (HYDRODIURIL) 25 MG tablet TAKE 1 TABLET (25 MG TOTAL) BY MOUTH DAILY. 90 tablet 1   ibuprofen (ADVIL) 600 MG tablet Take 1 tablet (600 mg total) by mouth every 6 (six) hours as needed. 30 tablet 0   Lidocaine 4 % PTCH Apply 1 patch topically daily as needed (pain).      metFORMIN (GLUCOPHAGE) 500 MG tablet TAKE 1 TABLET BY MOUTH 2 TIMES DAILY WITH A MEAL. 180 tablet 1   Multiple Vitamin (MULTIVITAMIN) capsule Take 1 capsule by mouth daily.     nitroGLYCERIN (NITROSTAT) 0.4 MG SL tablet Place 1 tablet (0.4 mg total) under the tongue every 5 (five) minutes as needed for chest pain. 25 tablet 4   ondansetron (ZOFRAN-ODT) 4 MG disintegrating tablet Take 1 tablet (4 mg total) by mouth every 8 (eight) hours as needed for nausea or vomiting. 40 tablet 0   SUMAtriptan (IMITREX) 100 MG tablet TAKE 1  TABLET (100 MG TOTAL) BY MOUTH AS NEEDED FOR MIGRAINE. MAY REPEAT IN 2 HOURS IF NEEDED 9 tablet 6   topiramate (TOPAMAX) 25 MG tablet TAKE 1-4 TABLETS (25-100 MG TOTAL) BY MOUTH AT BEDTIME. 360 tablet 0   triamcinolone (KENALOG) 0.025 % ointment Apply 1 application topically as needed. For rash 30 g 3   Semaglutide (OZEMPIC, 1 MG/DOSE, [place]) Inject 1 mg into the skin once a week. (Patient not taking: Reported on 07/29/2022)     No current facility-administered medications for this visit.    Review of Systems  Constitutional:  Negative for appetite change, chills, fatigue and fever.  HENT:   Negative for hearing loss and voice change.   Eyes:  Negative for eye problems.  Respiratory:  Negative for chest tightness and cough.   Cardiovascular:  Negative for chest pain.  Gastrointestinal:  Negative for abdominal distention, abdominal pain and blood in stool.  Endocrine: Negative for hot flashes.  Genitourinary:  Negative for difficulty urinating and frequency.   Musculoskeletal:  Negative for arthralgias.  Skin:  Negative for itching and rash.  Neurological:  Negative for extremity weakness.  Hematological:  Negative for adenopathy.  Psychiatric/Behavioral:  Negative for confusion.      PHYSICAL EXAMINATION: ECOG PERFORMANCE STATUS: 0 - Asymptomatic  Vitals:   09/05/22 1442  BP: 123/74  Pulse: 65  Resp: 20  Temp: 97.8 F (36.6 C)  SpO2: 100%   Filed Weights   09/05/22 1442  Weight: 161 lb 8 oz (73.3 kg)    Physical Exam Constitutional:      General: She is not in acute distress.    Appearance: She is not diaphoretic.  HENT:     Head: Normocephalic and atraumatic.     Nose: Nose normal.     Mouth/Throat:     Pharynx: No oropharyngeal exudate.  Eyes:     General: No scleral icterus.    Pupils: Pupils are equal, round, and reactive to  light.  Cardiovascular:     Rate and Rhythm: Normal rate and regular rhythm.     Heart sounds: No murmur heard. Pulmonary:     Effort:  Pulmonary effort is normal. No respiratory distress.     Breath sounds: No rales.  Chest:     Chest wall: No tenderness.  Abdominal:     General: There is no distension.     Palpations: Abdomen is soft.     Tenderness: There is no abdominal tenderness.  Musculoskeletal:        General: Normal range of motion.     Cervical back: Normal range of motion and neck supple.  Skin:    General: Skin is warm and dry.     Findings: No erythema.  Neurological:     Mental Status: She is alert and oriented to person, place, and time.     Cranial Nerves: No cranial nerve deficit.     Motor: No abnormal muscle tone.     Coordination: Coordination normal.  Psychiatric:        Mood and Affect: Affect normal.     LABORATORY DATA:  I have reviewed the data as listed    Latest Ref Rng & Units 08/07/2022    4:14 PM 07/29/2022    3:51 PM 05/08/2022    2:15 PM  CBC  WBC 4.0 - 10.5 K/uL 8.9  11.1  15.6   Hemoglobin 12.0 - 15.0 g/dL 12.7  11.7  14.6   Hematocrit 36.0 - 46.0 % 38.8  34.9  41.6   Platelets 150 - 400 K/uL 474  425  491       Latest Ref Rng & Units 07/29/2022    3:51 PM 05/08/2022    2:15 PM 01/18/2022    3:13 PM  CMP  Glucose 70 - 99 mg/dL 99  111  112   BUN 6 - 24 mg/dL 18  16  12    Creatinine 0.57 - 1.00 mg/dL 0.96  1.27  0.98   Sodium 134 - 144 mmol/L 137  141  141   Potassium 3.5 - 5.2 mmol/L 3.3  3.3  2.9   Chloride 96 - 106 mmol/L 99  89  91   CO2 20 - 29 mmol/L 24  31  29    Calcium 8.7 - 10.2 mg/dL 9.7  10.6  9.5   Total Protein 6.0 - 8.5 g/dL 6.3  7.4  6.9   Total Bilirubin 0.0 - 1.2 mg/dL <0.2  0.7  0.3   Alkaline Phos 44 - 121 IU/L 37  108  119   AST 0 - 40 IU/L 20  34  49   ALT 0 - 32 IU/L 18  37  42    Lab Results  Component Value Date   LDH 102 08/07/2022    RADIOGRAPHIC STUDIES: I have personally reviewed the radiological images as listed and agreed with the findings in the report. No results found.

## 2022-09-05 NOTE — Assessment & Plan Note (Signed)
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 Labs reviewed and discussed patient. Negative M protein on protein electrophoresis, slightly increased light chain ratio, nonspecific.  24-hour urine protein electrophoresis negative for M protein.  Peripheral blood flow cytometry negative.  Likely chronic leukocytosis reactive.  Repeat CBC showed a normalized WBC. Recommend observation. Repeat CBC, autoimmune markers in 3 months.  She agrees with the plan.

## 2022-09-06 ENCOUNTER — Inpatient Hospital Stay: Payer: 59 | Admitting: Oncology

## 2022-09-06 ENCOUNTER — Other Ambulatory Visit: Payer: 59

## 2022-09-13 ENCOUNTER — Ambulatory Visit (INDEPENDENT_AMBULATORY_CARE_PROVIDER_SITE_OTHER): Payer: 59 | Admitting: Internal Medicine

## 2022-09-13 ENCOUNTER — Encounter: Payer: Self-pay | Admitting: Internal Medicine

## 2022-09-13 VITALS — BP 124/78 | HR 74 | Temp 98.1°F | Ht 63.0 in | Wt 161.5 lb

## 2022-09-13 DIAGNOSIS — J069 Acute upper respiratory infection, unspecified: Secondary | ICD-10-CM | POA: Diagnosis not present

## 2022-09-13 DIAGNOSIS — J029 Acute pharyngitis, unspecified: Secondary | ICD-10-CM

## 2022-09-13 LAB — POCT RAPID STREP A (OFFICE): Rapid Strep A Screen: NEGATIVE

## 2022-09-13 MED ORDER — AZITHROMYCIN 250 MG PO TABS: ORAL_TABLET | ORAL | 0 refills | Status: AC

## 2022-09-13 NOTE — Progress Notes (Signed)
Date:  09/13/2022   Name:  Nancy Skinner   DOB:  March 09, 1966   MRN:  [id number]   Chief Complaint: Sore Throat  Sore Throat  This is a new problem. The problem has been unchanged. There has been no fever. The pain is at a severity of 3/10. The pain is moderate. Associated symptoms include congestion, diarrhea, headaches, swollen glands and trouble swallowing. Pertinent negatives include no ear pain, plugged ear sensation or shortness of breath. She has had no exposure to strep or mono.    Lab Results  Component Value Date   NA 137 07/29/2022   K 3.3 (L) 07/29/2022   CO2 24 07/29/2022   GLUCOSE 99 07/29/2022   BUN 18 07/29/2022   CREATININE 0.96 07/29/2022   CALCIUM 9.7 07/29/2022   EGFR 70 07/29/2022   GFRNONAA 87 09/11/2020   Lab Results  Component Value Date   CHOL 166 12/05/2021   HDL 38 (L) 12/05/2021   LDLCALC 74 12/05/2021   LDLDIRECT 48 09/02/2019   TRIG 337 (H) 12/05/2021   CHOLHDL 4.4 12/05/2021   Lab Results  Component Value Date   TSH 3.320 07/29/2022   Lab Results  Component Value Date   HGBA1C 5.7 (H) 07/29/2022   Lab Results  Component Value Date   WBC 8.9 08/07/2022   HGB 12.7 08/07/2022   HCT 38.8 08/07/2022   MCV 87.2 08/07/2022   PLT 474 (H) 08/07/2022   Lab Results  Component Value Date   ALT 18 07/29/2022   AST 20 07/29/2022   ALKPHOS 37 (L) 07/29/2022   BILITOT <0.2 07/29/2022   No results found for: "25OHVITD2", "25OHVITD3", "VD25OH"   Review of Systems  Constitutional:  Negative for chills, fatigue and fever.  HENT:  Positive for congestion, sore throat and trouble swallowing. Negative for ear pain.   Respiratory:  Negative for chest tightness and shortness of breath.   Cardiovascular:  Negative for chest pain and leg swelling.  Gastrointestinal:  Positive for abdominal distention, constipation, diarrhea and nausea.  Neurological:  Positive for headaches. Negative for dizziness.  Hematological:  Positive for adenopathy.   Psychiatric/Behavioral:  Negative for dysphoric mood and sleep disturbance. The patient is not nervous/anxious.     Patient Active Problem List   Diagnosis Date Noted   Leukocytosis 08/08/2022   Status post hysterectomy 03/05/2022   Essential hypertension 04/16/2021   Hepatic steatosis 03/19/2021   Carpal tunnel syndrome of right wrist 07/10/2020   Screening for colon cancer    Coronary artery disease of native artery of native heart with stable angina pectoris ([place]) 04/88/8916   Neutrophilic leukocytosis 94/50/3888   Pulmonary nodule less than 6 cm determined by computed tomography of lung 07/30/2018   Night sweats 07/16/2018   Neck muscle spasm 07/15/2018   Mixed hyperlipidemia 07/15/2018   Tobacco use disorder, moderate, in sustained remission 07/15/2018   Migraine without aura and without status migrainosus, not intractable 06/24/2018   Gastroesophageal reflux disease without esophagitis 06/24/2018   Chronic midline low back pain without sciatica 06/24/2018    Allergies  Allergen Reactions   Oxycodone Other (See Comments)    "Black outs" and confusion   Dye Fdc Red [Red Dye] Other (See Comments)    Severe migraine    Past Surgical History:  Procedure Laterality Date   ABDOMINAL HYSTERECTOMY  2012   partial for bleeding   BACK SURGERY     BREAST BIOPSY Right    neg   BREAST BIOPSY Left 2015  4 bxs- neg   BREAST SURGERY Left    multiple biopsies   CARDIAC CATHETERIZATION     CATARACT EXTRACTION Right 12/19/2010   CHOLECYSTECTOMY  1999   COLONOSCOPY WITH PROPOFOL N/A 03/31/2020   Procedure: COLONOSCOPY WITH PROPOFOL;  Surgeon: Lucilla Lame, MD;  Location: [facility] ENDOSCOPY;  Service: Endoscopy;  Laterality: N/A;   EYE SURGERY     INTRAVASCULAR PRESSURE WIRE/FFR STUDY N/A 07/22/2018   Procedure: INTRAVASCULAR PRESSURE WIRE/FFR STUDY;  Surgeon: Nelva Bush, MD;  Location: [place] CV LAB;  Service: Cardiovascular;  Laterality: N/A;   LEFT HEART CATH AND  CORONARY ANGIOGRAPHY N/A 07/22/2018   Procedure: LEFT HEART CATH AND CORONARY ANGIOGRAPHY;  Surgeon: Nelva Bush, MD;  Location: [place] CV LAB;  Service: Cardiovascular;  Laterality: N/A;   POSTERIOR LUMBAR FUSION 4 LEVEL  2000    Social History   Tobacco Use   Smoking status: Former    Packs/day: 0.25    Years: 40.00    Total pack years: 10.00    Types: Cigarettes    Quit date: 09/18/2019    Years since quitting: 2.9   Smokeless tobacco: Never  Vaping Use   Vaping Use: Never used  Substance Use Topics   Alcohol use: Yes    Comment: less than once a week.   Drug use: Never     Medication list has been reviewed and updated.  Current Meds  Medication Sig   albuterol (VENTOLIN HFA) 108 (90 Base) MCG/ACT inhaler INHALE 2 PUFFS INTO THE LUNGS EVERY 4 HOURS AS NEEDED FOR WHEEZE OR FOR SHORTNESS OF BREATH   aspirin EC 81 MG tablet Take 81 mg by mouth daily.   atorvastatin (LIPITOR) 40 MG tablet TAKE 1 TABLET BY MOUTH EVERY DAY   baclofen (LIORESAL) 10 MG tablet Take 1 tablet (10 mg total) by mouth 3 (three) times daily.   buPROPion (WELLBUTRIN XL) 300 MG 24 hr tablet TAKE 1 TABLET BY MOUTH EVERY DAY   clonazePAM (KLONOPIN) 0.25 MG disintegrating tablet Take 1 tablet (0.25 mg total) by mouth 2 (two) times daily as needed.   clotrimazole-betamethasone (LOTRISONE) cream APPLY 1 APPLICATION TOPICALLY 2 (TWO) TIMES DAILY. TO RASH ON LEGS   escitalopram (LEXAPRO) 20 MG tablet TAKE 1 TABLET BY MOUTH EVERY DAY   Esomeprazole Magnesium (NEXIUM PO) Take 20 mg by mouth daily.    fenofibrate (TRICOR) 145 MG tablet Take 1 tablet (145 mg total) by mouth daily.   hydrochlorothiazide (HYDRODIURIL) 25 MG tablet TAKE 1 TABLET (25 MG TOTAL) BY MOUTH DAILY.   ibuprofen (ADVIL) 600 MG tablet Take 1 tablet (600 mg total) by mouth every 6 (six) hours as needed.   Lidocaine 4 % PTCH Apply 1 patch topically daily as needed (pain).    metFORMIN (GLUCOPHAGE) 500 MG tablet TAKE 1 TABLET BY MOUTH 2  TIMES DAILY WITH A MEAL.   Multiple Vitamin (MULTIVITAMIN) capsule Take 1 capsule by mouth daily.   ondansetron (ZOFRAN-ODT) 4 MG disintegrating tablet Take 1 tablet (4 mg total) by mouth every 8 (eight) hours as needed for nausea or vomiting.   Semaglutide (OZEMPIC, 1 MG/DOSE, [place]) Inject 1 mg into the skin once a week.   SUMAtriptan (IMITREX) 100 MG tablet TAKE 1 TABLET (100 MG TOTAL) BY MOUTH AS NEEDED FOR MIGRAINE. MAY REPEAT IN 2 HOURS IF NEEDED   topiramate (TOPAMAX) 25 MG tablet TAKE 1-4 TABLETS (25-100 MG TOTAL) BY MOUTH AT BEDTIME.   triamcinolone (KENALOG) 0.025 % ointment Apply 1 application topically as needed. For  rash       07/29/2022    2:46 PM 05/20/2022    2:08 PM 01/16/2022    8:07 AM 12/05/2021    1:29 PM  GAD 7 : Generalized Anxiety Score  Nervous, Anxious, on Edge 0 _0 Control/stop worrying _1 Worry too much - different things _2 Trouble relaxing _3 Restless _4 Easily annoyed or irritable _5 Afraid - awful might happen 0 0 1 1  Total GAD 7 Score _6 Anxiety Difficulty Not difficult at all Not difficult at all         07/29/2022    2:45 PM 05/20/2022    2:07 PM 01/16/2022    8:07 AM  Depression screen PHQ 2/9  Decreased Interest _7 Down, Depressed, Hopeless _8 PHQ - 2 Score _9 Altered sleeping 0 0 3  Tired, decreased energy _10 Change in appetite 0 0 3  Feeling bad or failure about yourself  _11 Trouble concentrating 0 0 0  Moving slowly or fidgety/restless 0 0 0  Suicidal thoughts 0 0 0  PHQ-9 Score _12 Difficult doing work/chores Not difficult at all Not difficult at all     BP Readings from Last 3 Encounters:  09/13/22 124/78  09/05/22 123/74  08/07/22 125/67    Physical Exam Vitals and nursing note reviewed.  Constitutional:      General: She is not in acute distress.    Appearance: She is well-developed.  HENT:     Head: Normocephalic and atraumatic.     Right Ear: Tympanic  membrane and ear canal normal.     Left Ear: Tympanic membrane and ear canal normal.     Mouth/Throat:     Mouth: Mucous membranes are moist.     Pharynx: Posterior oropharyngeal erythema present. No oropharyngeal exudate.     Tonsils: No tonsillar exudate.  Eyes:     Conjunctiva/sclera: Conjunctivae normal.  Cardiovascular:     Rate and Rhythm: Normal rate and regular rhythm.  Pulmonary:     Effort: Pulmonary effort is normal. No respiratory distress.     Breath sounds: No wheezing or rhonchi.  Skin:    General: Skin is warm and dry.     Findings: No rash.  Neurological:     Mental Status: She is alert and oriented to person, place, and time.  Psychiatric:        Mood and Affect: Mood normal.        Behavior: Behavior normal.     Wt Readings from Last 3 Encounters:  09/13/22 161 lb 8 oz (73.3 kg)  09/05/22 161 lb 8 oz (73.3 kg)  08/07/22 163 lb (73.9 kg)    BP 124/78   Pulse 74   Temp 98.1 F (36.7 C) (Oral)   Ht 5' 3" (1.6 m)   Wt 161 lb 8 oz (73.3 kg)   SpO2 97%   BMI 28.61 kg/m   Assessment and Plan: 1. Viral upper respiratory tract infection Continue Tylenol and sudafed for symptom relief Push fluids - azithromycin (ZITHROMAX Z-PAK) 250 MG tablet; UAD  Dispense: 6 each; Refill: 0  2. Sore throat Strep negative - symptomatic care recommended. - POCT rapid strep A - negative   Partially dictated using Editor, commissioning. Any errors are unintentional.  Halina Maidens, MD [facility]  09/13/2022

## 2022-09-13 NOTE — Patient Instructions (Signed)
Take Tylenol and Sudafed for symptom relief.  Take the Zpak if you develop fever or discolored sputum or nasal drainage.

## 2022-10-02 ENCOUNTER — Other Ambulatory Visit: Payer: Self-pay | Admitting: Internal Medicine

## 2022-10-02 ENCOUNTER — Telehealth: Payer: Self-pay

## 2022-10-02 ENCOUNTER — Other Ambulatory Visit: Payer: Self-pay

## 2022-10-02 DIAGNOSIS — I10 Essential (primary) hypertension: Secondary | ICD-10-CM

## 2022-10-02 DIAGNOSIS — F324 Major depressive disorder, single episode, in partial remission: Secondary | ICD-10-CM

## 2022-10-02 DIAGNOSIS — F418 Other specified anxiety disorders: Secondary | ICD-10-CM

## 2022-10-02 DIAGNOSIS — F321 Major depressive disorder, single episode, moderate: Secondary | ICD-10-CM

## 2022-10-02 MED ORDER — ESCITALOPRAM OXALATE 20 MG PO TABS: 20.0000 mg | ORAL_TABLET | Freq: Every day | ORAL | 1 refills | Status: DC

## 2022-10-02 MED ORDER — BUPROPION HCL ER (XL) 300 MG PO TB24: 300.0000 mg | ORAL_TABLET | Freq: Every day | ORAL | 1 refills | Status: DC

## 2022-10-02 MED ORDER — HYDROCHLOROTHIAZIDE 25 MG PO TABS: 25.0000 mg | ORAL_TABLET | Freq: Every day | ORAL | 1 refills | Status: DC

## 2022-10-02 MED ORDER — METFORMIN HCL 500 MG PO TABS: 500.0000 mg | ORAL_TABLET | Freq: Two times a day (BID) | ORAL | 1 refills | Status: DC

## 2022-10-02 MED ORDER — CLONAZEPAM 0.25 MG PO TBDP: 0.2500 mg | ORAL_TABLET | Freq: Two times a day (BID) | ORAL | 0 refills | Status: DC | PRN

## 2022-10-02 NOTE — Telephone Encounter (Signed)
Patient requesting refill for clonazepam to [facility] in [place]

## 2022-11-05 ENCOUNTER — Telehealth: Payer: Self-pay

## 2022-11-05 NOTE — Telephone Encounter (Signed)
Called to do [facility] call because patient came up on my patient PING list. She said she has not been to the ER or Admitted in the hospital so this was an error. Thanked her for her time.  Troy Hartzog

## 2022-11-28 ENCOUNTER — Inpatient Hospital Stay: Payer: 59 | Attending: Oncology

## 2022-11-28 DIAGNOSIS — R700 Elevated erythrocyte sedimentation rate: Secondary | ICD-10-CM | POA: Insufficient documentation

## 2022-11-28 DIAGNOSIS — Z90710 Acquired absence of both cervix and uterus: Secondary | ICD-10-CM | POA: Insufficient documentation

## 2022-11-28 DIAGNOSIS — Z803 Family history of malignant neoplasm of breast: Secondary | ICD-10-CM | POA: Insufficient documentation

## 2022-11-28 DIAGNOSIS — D72820 Lymphocytosis (symptomatic): Secondary | ICD-10-CM | POA: Diagnosis not present

## 2022-11-28 DIAGNOSIS — Z87891 Personal history of nicotine dependence: Secondary | ICD-10-CM | POA: Diagnosis not present

## 2022-11-28 LAB — CBC WITH DIFFERENTIAL/PLATELET
Abs Immature Granulocytes: 0.11 10*3/uL — ABNORMAL HIGH (ref 0.00–0.07)
Basophils Absolute: 0.1 10*3/uL (ref 0.0–0.1)
Basophils Relative: 1 %
Eosinophils Absolute: 0.4 10*3/uL (ref 0.0–0.5)
Eosinophils Relative: 3 %
HCT: 37.6 % (ref 36.0–46.0)
Hemoglobin: 12.5 g/dL (ref 12.0–15.0)
Immature Granulocytes: 1 %
Lymphocytes Relative: 44 %
Lymphs Abs: 5.1 10*3/uL — ABNORMAL HIGH (ref 0.7–4.0)
MCH: 28.3 pg (ref 26.0–34.0)
MCHC: 33.2 g/dL (ref 30.0–36.0)
MCV: 85.3 fL (ref 80.0–100.0)
Monocytes Absolute: 0.5 10*3/uL (ref 0.1–1.0)
Monocytes Relative: 5 %
Neutro Abs: 5.5 10*3/uL (ref 1.7–7.7)
Neutrophils Relative %: 46 %
Platelets: 370 10*3/uL (ref 150–400)
RBC: 4.41 MIL/uL (ref 3.87–5.11)
RDW: 14 % (ref 11.5–15.5)
WBC: 11.8 10*3/uL — ABNORMAL HIGH (ref 4.0–10.5)
nRBC: 0 % (ref 0.0–0.2)

## 2022-11-28 LAB — HEPATITIS B CORE ANTIBODY, TOTAL: Hep B Core Total Ab: NONREACTIVE

## 2022-11-28 LAB — COMPREHENSIVE METABOLIC PANEL
ALT: 19 U/L (ref 0–44)
AST: 25 U/L (ref 15–41)
Albumin: 3.7 g/dL (ref 3.5–5.0)
Alkaline Phosphatase: 60 U/L (ref 38–126)
Anion gap: 12 (ref 5–15)
BUN: 17 mg/dL (ref 6–20)
CO2: 25 mmol/L (ref 22–32)
Calcium: 8.9 mg/dL (ref 8.9–10.3)
Chloride: 100 mmol/L (ref 98–111)
Creatinine, Ser: 0.82 mg/dL (ref 0.44–1.00)
GFR, Estimated: >60 mL/min (ref >60)
Glucose, Bld: 158 mg/dL — ABNORMAL HIGH (ref 70–99)
Potassium: 3.8 mmol/L (ref 3.5–5.1)
Sodium: 137 mmol/L (ref 135–145)
Total Bilirubin: 0.3 mg/dL (ref 0.3–1.2)
Total Protein: 7 g/dL (ref 6.5–8.1)

## 2022-11-28 LAB — HEPATITIS B SURFACE ANTIGEN: Hepatitis B Surface Ag: NONREACTIVE

## 2022-11-28 LAB — SEDIMENTATION RATE: Sed Rate: 34 mm/hr — ABNORMAL HIGH (ref 0–30)

## 2022-11-29 LAB — ANA W/REFLEX: Anti Nuclear Antibody (ANA): NEGATIVE

## 2022-11-29 LAB — C-REACTIVE PROTEIN: CRP: 1.5 mg/dL — ABNORMAL HIGH (ref <1.0)

## 2022-12-05 ENCOUNTER — Inpatient Hospital Stay (HOSPITAL_BASED_OUTPATIENT_CLINIC_OR_DEPARTMENT_OTHER): Payer: 59 | Admitting: Oncology

## 2022-12-05 ENCOUNTER — Encounter: Payer: Self-pay | Admitting: Oncology

## 2022-12-05 VITALS — BP 123/78 | HR 83 | Temp 97.1°F | Resp 18 | Wt 184.1 lb

## 2022-12-05 DIAGNOSIS — D72820 Lymphocytosis (symptomatic): Secondary | ICD-10-CM

## 2022-12-05 DIAGNOSIS — R700 Elevated erythrocyte sedimentation rate: Secondary | ICD-10-CM

## 2022-12-05 NOTE — Progress Notes (Signed)
New occasional right shoulder pain that radiates down her arm not been evaluated.

## 2022-12-07 DIAGNOSIS — R700 Elevated erythrocyte sedimentation rate: Secondary | ICD-10-CM | POA: Insufficient documentation

## 2022-12-07 NOTE — Progress Notes (Signed)
[facility] NOTE  Patient Care Team: Glean Hess, MD as PCP - General (Internal Medicine) End, Harrell Gave, MD as PCP - Cardiology (Cardiology)  ASSESSMENT & PLAN  Leukocytosis Labs are reviewed and discussed with patient. Elevated ESR, and CRP. ANA is negative, negative hepatitis B, normal LDH, negative M protein on SPEP, light chain ratio is slightly elevated, non specific. 24 h UPEP negative. No significant diagnostic immunophenotypic abnormality on flowcytometry. I recommend observation.   ESR raised Refer to rheumatology   Orders Placed This Encounter  Procedures   Ambulatory referral to Rheumatology    Referral Priority:   Routine    Referral Type:   Consultation    Referral Reason:   Specialty Services Required    Requested Specialty:   Rheumatology    Number of Visits Requested:   1   Follow-up PRN Earlie Server, MD, PhD [facility] Oncology 12/05/2022       CHIEF COMPLAINTS/PURPOSE OF CONSULTATION:  Leukocytosis/elevate white count  HISTORY OF PRESENTING ILLNESS:  Nancy Skinner 56 y.o. female is here because of elevated WBC.  She was found to have abnormal CBC on 07/29/2022, total white count 11.1, lymphocytosis, absolute lymphocyte 4.7.  Reviewed previous lab history.  Lymphocytosis is chronic. She denies recent infection.  There is not reported symptoms of sinus congestion, cough, urinary frequency/urgency or dysuria, diarrhea, joint swelling/pain or abnormal skin rash.  She had no prior history or diagnosis of cancer.  Patient is overdue for mammogram. The patient has no prior diagnosis of autoimmune disease and was not prescribed corticosteroids related products. Patient smokes on some days.  A pack last about 2 weeks.  INTERVAL HISTORY Nancy Skinner is a 56 y.o. female who has above history reviewed by me today presents for follow up visit for leukocytosis.  She has no new complaints. She presents to discuss results.    MEDICAL HISTORY:  Past Medical History:  Diagnosis Date   Coronary artery disease, non-occlusive    a. LHC 07/22/18: LM nl, mLAD 60% w/ FFR 0.83, LCx nl, RCA nl, EF 55-65%, nl LVEDP, no AS.   Depression    Lumbar disc disease    Migraines    Mixed hyperlipidemia     SURGICAL HISTORY: Past Surgical History:  Procedure Laterality Date   ABDOMINAL HYSTERECTOMY  2012   partial for bleeding   BACK SURGERY     BREAST BIOPSY Right    neg   BREAST BIOPSY Left 2015   4 bxs- neg   BREAST SURGERY Left    multiple biopsies   CARDIAC CATHETERIZATION     CATARACT EXTRACTION Right 12/19/2010   CHOLECYSTECTOMY  1999   COLONOSCOPY WITH PROPOFOL N/A 03/31/2020   Procedure: COLONOSCOPY WITH PROPOFOL;  Surgeon: Lucilla Lame, MD;  Location: [facility];  Service: Endoscopy;  Laterality: N/A;   EYE SURGERY     INTRAVASCULAR PRESSURE WIRE/FFR STUDY N/A 07/22/2018   Procedure: INTRAVASCULAR PRESSURE WIRE/FFR STUDY;  Surgeon: Nelva Bush, MD;  Location: [facility];  Service: Cardiovascular;  Laterality: N/A;   LEFT HEART CATH AND CORONARY ANGIOGRAPHY N/A 07/22/2018   Procedure: LEFT HEART CATH AND CORONARY ANGIOGRAPHY;  Surgeon: Nelva Bush, MD;  Location: [facility];  Service: Cardiovascular;  Laterality: N/A;   POSTERIOR LUMBAR FUSION 4 LEVEL  2000    SOCIAL HISTORY: Social History   Socioeconomic History   Marital status: Married    Spouse name: Not on file   Number of children: Not on file  Years of education: Not on file   Highest education level: Not on file  Occupational History   Occupation: [employer]  Tobacco Use   Smoking status: Former    Packs/day: 0.25    Years: 40.00    Total pack years: 10.00    Types: Cigarettes    Quit date: 09/18/2019    Years since quitting: 3.2   Smokeless tobacco: Never  Vaping Use   Vaping Use: Never used  Substance and Sexual Activity   Alcohol use: Yes    Comment: less than once a week.   Drug use: Never    Sexual activity: Yes    Birth control/protection: Surgical  Other Topics Concern   Not on file  Social History Narrative   Not on file   Social Determinants of Health   Financial Resource Strain: Low Risk  (05/20/2022)   Overall Financial Resource Strain (CARDIA)    Difficulty of Paying Living Expenses: Not hard at all  Food Insecurity: No Food Insecurity (05/20/2022)   Hunger Vital Sign    Worried About Running Out of Food in the Last Year: Never true    Ran Out of Food in the Last Year: Never true  Transportation Needs: No Transportation Needs (05/20/2022)   PRAPARE - Hydrologist (Medical): No    Lack of Transportation (Non-Medical): No  Physical Activity: Not on file  Stress: Not on file  Social Connections: Not on file  Intimate Partner Violence: Not At Risk (05/20/2022)   Humiliation, Afraid, Rape, and Kick questionnaire    Fear of Current or Ex-Partner: No    Emotionally Abused: No    Physically Abused: No    Sexually Abused: No    FAMILY HISTORY: Family History  Problem Relation Age of Onset   Breast cancer Mother 13   Heart attack Father 62   Alcohol abuse Father    Breast cancer Maternal Grandmother    Breast cancer Paternal Grandmother     ALLERGIES:  is allergic to oxycodone and dye fdc red [red dye].  MEDICATIONS:  Current Outpatient Medications  Medication Sig Dispense Refill   albuterol (VENTOLIN HFA) 108 (90 Base) MCG/ACT inhaler INHALE 2 PUFFS INTO THE LUNGS EVERY 4 HOURS AS NEEDED FOR WHEEZE OR FOR SHORTNESS OF BREATH 6.7 each 0   aspirin EC 81 MG tablet Take 81 mg by mouth daily.     atorvastatin (LIPITOR) 40 MG tablet TAKE 1 TABLET BY MOUTH EVERY DAY 90 tablet 0   baclofen (LIORESAL) 10 MG tablet Take 1 tablet (10 mg total) by mouth 3 (three) times daily. 30 each 0   buPROPion (WELLBUTRIN XL) 300 MG 24 hr tablet Take 1 tablet (300 mg total) by mouth daily. 90 tablet 1   clonazePAM (KLONOPIN) 0.25 MG disintegrating tablet Take  1 tablet (0.25 mg total) by mouth 2 (two) times daily as needed. 60 tablet 0   clotrimazole-betamethasone (LOTRISONE) cream APPLY 1 APPLICATION TOPICALLY 2 (TWO) TIMES DAILY. TO RASH ON LEGS 30 g 0   escitalopram (LEXAPRO) 20 MG tablet Take 1 tablet (20 mg total) by mouth daily. 90 tablet 1   Esomeprazole Magnesium (NEXIUM PO) Take 20 mg by mouth daily.      fenofibrate (TRICOR) 145 MG tablet Take 1 tablet (145 mg total) by mouth daily. 90 tablet 3   hydrochlorothiazide (HYDRODIURIL) 25 MG tablet Take 1 tablet (25 mg total) by mouth daily. 90 tablet 1   ibuprofen (ADVIL) 600 MG tablet Take 1 tablet (  600 mg total) by mouth every 6 (six) hours as needed. 30 tablet 0   Lidocaine 4 % PTCH Apply 1 patch topically daily as needed (pain).      metFORMIN (GLUCOPHAGE) 500 MG tablet Take 1 tablet (500 mg total) by mouth 2 (two) times daily with a meal. 180 tablet 1   Multiple Vitamin (MULTIVITAMIN) capsule Take 1 capsule by mouth daily.     ondansetron (ZOFRAN-ODT) 4 MG disintegrating tablet Take 1 tablet (4 mg total) by mouth every 8 (eight) hours as needed for nausea or vomiting. 40 tablet 0   SUMAtriptan (IMITREX) 100 MG tablet TAKE 1 TABLET (100 MG TOTAL) BY MOUTH AS NEEDED FOR MIGRAINE. MAY REPEAT IN 2 HOURS IF NEEDED 9 tablet 6   topiramate (TOPAMAX) 25 MG tablet TAKE 1-4 TABLETS (25-100 MG TOTAL) BY MOUTH AT BEDTIME. 360 tablet 0   triamcinolone (KENALOG) 0.025 % ointment Apply 1 application topically as needed. For rash 30 g 3   nitroGLYCERIN (NITROSTAT) 0.4 MG SL tablet Place 1 tablet (0.4 mg total) under the tongue every 5 (five) minutes as needed for chest pain. 25 tablet 4   Semaglutide (OZEMPIC, 1 MG/DOSE, [place]) Inject 1 mg into the skin once a week. (Patient not taking: Reported on 12/05/2022)     No current facility-administered medications for this visit.    Review of Systems  Constitutional:  Negative for appetite change, chills, fatigue and fever.  HENT:   Negative for hearing loss and  voice change.   Eyes:  Negative for eye problems.  Respiratory:  Negative for chest tightness and cough.   Cardiovascular:  Negative for chest pain.  Gastrointestinal:  Negative for abdominal distention, abdominal pain and blood in stool.  Endocrine: Negative for hot flashes.  Genitourinary:  Negative for difficulty urinating and frequency.   Musculoskeletal:  Negative for arthralgias.  Skin:  Negative for itching and rash.  Neurological:  Negative for extremity weakness.  Hematological:  Negative for adenopathy.  Psychiatric/Behavioral:  Negative for confusion.      PHYSICAL EXAMINATION: Vitals:   12/05/22 1500  BP: 123/78  Pulse: 83  Resp: 18  Temp: (!) 97.1 F (36.2 C)   Filed Weights   12/05/22 1500  Weight: 184 lb 1.6 oz (83.5 kg)    Physical Exam Constitutional:      General: She is not in acute distress.    Appearance: She is not diaphoretic.  HENT:     Head: Normocephalic.     Nose: Nose normal.     Mouth/Throat:     Pharynx: No oropharyngeal exudate.  Eyes:     General: No scleral icterus.    Pupils: Pupils are equal, round, and reactive to light.  Cardiovascular:     Rate and Rhythm: Normal rate.     Heart sounds: No murmur heard. Pulmonary:     Effort: Pulmonary effort is normal. No respiratory distress.  Abdominal:     General: There is no distension.  Musculoskeletal:        General: Normal range of motion.     Cervical back: Normal range of motion.  Skin:    Findings: No erythema.  Neurological:     Mental Status: She is alert and oriented to person, place, and time. Mental status is at baseline.     Cranial Nerves: No cranial nerve deficit.     Motor: No abnormal muscle tone.  Psychiatric:        Mood and Affect: Mood and affect normal.  LABORATORY DATA:  I have reviewed the data as listed    Latest Ref Rng & Units 11/28/2022    2:48 PM 08/07/2022    4:14 PM 07/29/2022    3:51 PM  CBC  WBC 4.0 - 10.5 K/uL 11.8  8.9  11.1    Hemoglobin 12.0 - 15.0 g/dL 12.5  12.7  11.7   Hematocrit 36.0 - 46.0 % 37.6  38.8  34.9   Platelets 150 - 400 K/uL 370  474  425       Latest Ref Rng & Units 11/28/2022    2:48 PM 07/29/2022    3:51 PM 05/08/2022    2:15 PM  CMP  Glucose 70 - 99 mg/dL 158  99  111   BUN 6 - 20 mg/dL _0 Creatinine 0.44 - 1.00 mg/dL 0.82  0.96  1.27   Sodium 135 - 145 mmol/L 137  137  141   Potassium 3.5 - 5.1 mmol/L 3.8  3.3  3.3   Chloride 98 - 111 mmol/L 100  99  89   CO2 22 - 32 mmol/L _1 Calcium 8.9 - 10.3 mg/dL 8.9  9.7  10.6   Total Protein 6.5 - 8.1 g/dL 7.0  6.3  7.4   Total Bilirubin 0.3 - 1.2 mg/dL 0.3  <0.2  0.7   Alkaline Phos 38 - 126 U/L 60  37  108   AST 15 - 41 U/L 25  20  34   ALT 0 - 44 U/L 19  18  37    Lab Results  Component Value Date   LDH 102 08/07/2022    RADIOGRAPHIC STUDIES: I have personally reviewed the radiological images as listed and agreed with the findings in the report. No results found.

## 2022-12-07 NOTE — Assessment & Plan Note (Signed)
Refer to rheumatology  

## 2022-12-07 NOTE — Assessment & Plan Note (Addendum)
Labs are reviewed and discussed with patient. Elevated ESR, and CRP. ANA is negative, negative hepatitis B, normal LDH, negative M protein on SPEP, light chain ratio is slightly elevated, non specific. 24 h UPEP negative. No significant diagnostic immunophenotypic abnormality on flowcytometry. I recommend observation.

## 2023-01-06 ENCOUNTER — Other Ambulatory Visit: Payer: Self-pay

## 2023-01-06 ENCOUNTER — Encounter: Payer: Self-pay | Admitting: Internal Medicine

## 2023-01-06 ENCOUNTER — Ambulatory Visit (INDEPENDENT_AMBULATORY_CARE_PROVIDER_SITE_OTHER): Payer: 59 | Admitting: Internal Medicine

## 2023-01-06 VITALS — BP 126/70 | HR 86 | Ht 63.0 in | Wt 199.0 lb

## 2023-01-06 DIAGNOSIS — D72829 Elevated white blood cell count, unspecified: Secondary | ICD-10-CM

## 2023-01-06 DIAGNOSIS — R609 Edema, unspecified: Secondary | ICD-10-CM | POA: Diagnosis not present

## 2023-01-06 DIAGNOSIS — R700 Elevated erythrocyte sedimentation rate: Secondary | ICD-10-CM

## 2023-01-06 MED ORDER — FUROSEMIDE 20 MG PO TABS: 20.0000 mg | ORAL_TABLET | Freq: Every day | ORAL | 0 refills | Status: DC

## 2023-01-06 MED ORDER — POTASSIUM CHLORIDE CRYS ER 10 MEQ PO TBCR: 10.0000 meq | EXTENDED_RELEASE_TABLET | Freq: Every day | ORAL | 0 refills | Status: DC

## 2023-01-06 NOTE — Progress Notes (Signed)
Date:  01/06/2023   Name:  Nancy Skinner   DOB:  10/29/1966   MRN:  [id number]   Chief Complaint: Edema (Swollen and fluid build up in face, stomach, ankles, and legs. This is the first time this has happened. Patient said she has gained 20 lbs in the weekend.)  HPI Edema - has developed marked edema in the legs over the weekend.  It may have been coming on gradually for a few weeks but now very symptomatic.  Shortness of breath at baseline, no chest pain or PND.  No change in diet - has been trying to limit sodium.  Medications are unchanged.  She is awaiting Rheum appointment for elevated sed rate and WBC.  Lab Results  Component Value Date   NA 137 11/28/2022   K 3.8 11/28/2022   CO2 25 11/28/2022   GLUCOSE 158 (H) 11/28/2022   BUN 17 11/28/2022   CREATININE 0.82 11/28/2022   CALCIUM 8.9 11/28/2022   EGFR 70 07/29/2022   GFRNONAA >60 11/28/2022   Lab Results  Component Value Date   CHOL 166 12/05/2021   HDL 38 (L) 12/05/2021   LDLCALC 74 12/05/2021   LDLDIRECT 48 09/02/2019   TRIG 337 (H) 12/05/2021   CHOLHDL 4.4 12/05/2021   Lab Results  Component Value Date   TSH 3.320 07/29/2022   Lab Results  Component Value Date   HGBA1C 5.7 (H) 07/29/2022   Lab Results  Component Value Date   WBC 11.8 (H) 11/28/2022   HGB 12.5 11/28/2022   HCT 37.6 11/28/2022   MCV 85.3 11/28/2022   PLT 370 11/28/2022   Lab Results  Component Value Date   ALT 19 11/28/2022   AST 25 11/28/2022   ALKPHOS 60 11/28/2022   BILITOT 0.3 11/28/2022   No results found for: "25OHVITD2", "25OHVITD3", "VD25OH"   Review of Systems  Constitutional:  Positive for fatigue. Negative for unexpected weight change.  HENT:  Negative for nosebleeds.   Eyes:  Negative for visual disturbance.  Respiratory:  Positive for shortness of breath. Negative for cough, chest tightness and wheezing.   Cardiovascular:  Positive for leg swelling. Negative for chest pain and palpitations.  Gastrointestinal:   Negative for abdominal pain, constipation and diarrhea.  Neurological:  Negative for dizziness, weakness, light-headedness and headaches.  Psychiatric/Behavioral:  Negative for dysphoric mood and sleep disturbance. The patient is not nervous/anxious.     Patient Active Problem List   Diagnosis Date Noted   ESR raised 12/07/2022   Leukocytosis 08/08/2022   Status post hysterectomy 03/05/2022   Essential hypertension 04/16/2021   Hepatic steatosis 03/19/2021   Carpal tunnel syndrome of right wrist 07/10/2020   Screening for colon cancer    Coronary artery disease of native artery of native heart with stable angina pectoris ([place]) 32/95/1884   Neutrophilic leukocytosis 16/60/6301   Pulmonary nodule less than 6 cm determined by computed tomography of lung 07/30/2018   Night sweats 07/16/2018   Neck muscle spasm 07/15/2018   Mixed hyperlipidemia 07/15/2018   Tobacco use disorder, moderate, in sustained remission 07/15/2018   Migraine without aura and without status migrainosus, not intractable 06/24/2018   Gastroesophageal reflux disease without esophagitis 06/24/2018   Chronic midline low back pain without sciatica 06/24/2018    Allergies  Allergen Reactions   Oxycodone Other (See Comments)    "Black outs" and confusion   Dye Fdc Red [Red Dye] Other (See Comments)    Severe migraine    Past Surgical History:  Procedure  Laterality Date   ABDOMINAL HYSTERECTOMY  2012   partial for bleeding   BACK SURGERY     BREAST BIOPSY Right    neg   BREAST BIOPSY Left 2015   4 bxs- neg   BREAST SURGERY Left    multiple biopsies   CARDIAC CATHETERIZATION     CATARACT EXTRACTION Right 12/19/2010   CHOLECYSTECTOMY  1999   COLONOSCOPY WITH PROPOFOL N/A 03/31/2020   Procedure: COLONOSCOPY WITH PROPOFOL;  Surgeon: Lucilla Lame, MD;  Location: [facility];  Service: Endoscopy;  Laterality: N/A;   EYE SURGERY     INTRAVASCULAR PRESSURE WIRE/FFR STUDY N/A 07/22/2018   Procedure: INTRAVASCULAR  PRESSURE WIRE/FFR STUDY;  Surgeon: Nelva Bush, MD;  Location: [facility];  Service: Cardiovascular;  Laterality: N/A;   LEFT HEART CATH AND CORONARY ANGIOGRAPHY N/A 07/22/2018   Procedure: LEFT HEART CATH AND CORONARY ANGIOGRAPHY;  Surgeon: Nelva Bush, MD;  Location: [facility];  Service: Cardiovascular;  Laterality: N/A;   POSTERIOR LUMBAR FUSION 4 LEVEL  2000    Social History   Tobacco Use   Smoking status: Former    Packs/day: 0.25    Years: 40.00    Total pack years: 10.00    Types: Cigarettes    Quit date: 09/18/2019    Years since quitting: 3.3   Smokeless tobacco: Never  Vaping Use   Vaping Use: Never used  Substance Use Topics   Alcohol use: Yes    Comment: less than once a week.   Drug use: Never     Medication list has been reviewed and updated.  Current Meds  Medication Sig   albuterol (VENTOLIN HFA) 108 (90 Base) MCG/ACT inhaler INHALE 2 PUFFS INTO THE LUNGS EVERY 4 HOURS AS NEEDED FOR WHEEZE OR FOR SHORTNESS OF BREATH   aspirin EC 81 MG tablet Take 81 mg by mouth daily.   atorvastatin (LIPITOR) 40 MG tablet TAKE 1 TABLET BY MOUTH EVERY DAY   baclofen (LIORESAL) 10 MG tablet Take 1 tablet (10 mg total) by mouth 3 (three) times daily.   buPROPion (WELLBUTRIN XL) 300 MG 24 hr tablet Take 1 tablet (300 mg total) by mouth daily.   clonazePAM (KLONOPIN) 0.25 MG disintegrating tablet Take 1 tablet (0.25 mg total) by mouth 2 (two) times daily as needed.   clotrimazole-betamethasone (LOTRISONE) cream APPLY 1 APPLICATION TOPICALLY 2 (TWO) TIMES DAILY. TO RASH ON LEGS   escitalopram (LEXAPRO) 20 MG tablet Take 1 tablet (20 mg total) by mouth daily.   Esomeprazole Magnesium (NEXIUM PO) Take 20 mg by mouth daily.    fenofibrate (TRICOR) 145 MG tablet Take 1 tablet (145 mg total) by mouth daily.   furosemide (LASIX) 20 MG tablet Take 1 tablet (20 mg total) by mouth daily.   hydrochlorothiazide (HYDRODIURIL) 25 MG tablet Take 1 tablet (25 mg total)  by mouth daily.   ibuprofen (ADVIL) 600 MG tablet Take 1 tablet (600 mg total) by mouth every 6 (six) hours as needed.   Lidocaine 4 % PTCH Apply 1 patch topically daily as needed (pain).    metFORMIN (GLUCOPHAGE) 500 MG tablet Take 1 tablet (500 mg total) by mouth 2 (two) times daily with a meal.   Multiple Vitamin (MULTIVITAMIN) capsule Take 1 capsule by mouth daily.   ondansetron (ZOFRAN-ODT) 4 MG disintegrating tablet Take 1 tablet (4 mg total) by mouth every 8 (eight) hours as needed for nausea or vomiting.   potassium chloride (KLOR-CON M) 10 MEQ tablet Take 1 tablet (10  mEq total) by mouth daily.   SUMAtriptan (IMITREX) 100 MG tablet TAKE 1 TABLET (100 MG TOTAL) BY MOUTH AS NEEDED FOR MIGRAINE. MAY REPEAT IN 2 HOURS IF NEEDED   triamcinolone (KENALOG) 0.025 % ointment Apply 1 application topically as needed. For rash   [DISCONTINUED] Semaglutide (OZEMPIC, 1 MG/DOSE, [place]) Inject 1 mg into the skin once a week.   [DISCONTINUED] topiramate (TOPAMAX) 25 MG tablet TAKE 1-4 TABLETS (25-100 MG TOTAL) BY MOUTH AT BEDTIME.       01/06/2023    4:15 PM 07/29/2022    2:46 PM 05/20/2022    2:08 PM 01/16/2022    8:07 AM  GAD 7 : Generalized Anxiety Score  Nervous, Anxious, on Edge 0 0 3 3  Control/stop worrying 0 2 2 3   Worry too much - different things 0 2 2 3   Trouble relaxing  2 2 3   Restless 0 2 2 3   Easily annoyed or irritable 0 3 3 3   Afraid - awful might happen 0 0 0 1  Total GAD 7 Score  11 14 19   Anxiety Difficulty Not difficult at all Not difficult at all Not difficult at all        01/06/2023    4:15 PM 07/29/2022    2:45 PM 05/20/2022    2:07 PM  Depression screen PHQ 2/9  Decreased Interest 0 3 3  Down, Depressed, Hopeless 0 2 2  PHQ - 2 Score 0 5 5  Altered sleeping 3 0 0  Tired, decreased energy 3 3 3   Change in appetite 0 0 0  Feeling bad or failure about yourself  1 1 1   Trouble concentrating 0 0 0  Moving slowly or fidgety/restless 0 0 0  Suicidal thoughts 0 0 0  PHQ-9  Score 7 9 9   Difficult doing work/chores Not difficult at all Not difficult at all Not difficult at all    BP Readings from Last 3 Encounters:  01/06/23 126/70  12/05/22 123/78  09/13/22 124/78    Physical Exam Vitals and nursing note reviewed.  Constitutional:      General: She is not in acute distress.    Appearance: Normal appearance. She is well-developed.  HENT:     Head: Normocephalic and atraumatic.  Neck:     Thyroid: No thyroid mass.     Vascular: No JVD.  Cardiovascular:     Rate and Rhythm: Normal rate and regular rhythm.     Pulses: Normal pulses.  Pulmonary:     Effort: Pulmonary effort is normal. No respiratory distress.     Breath sounds: Normal breath sounds. No wheezing or rhonchi.  Musculoskeletal:     Cervical back: Normal range of motion.     Right lower leg: Edema present.     Left lower leg: Edema present.  Skin:    General: Skin is warm and dry.     Findings: No rash.  Neurological:     Mental Status: She is alert and oriented to person, place, and time.  Psychiatric:        Mood and Affect: Mood normal.        Behavior: Behavior normal.     Wt Readings from Last 3 Encounters:  01/06/23 199 lb (90.3 kg)  12/05/22 184 lb 1.6 oz (83.5 kg)  09/13/22 161 lb 8 oz (73.3 kg)    BP 126/70   Pulse 86   Ht 5\' 3"  (1.6 m)   Wt 199 lb (90.3 kg)   SpO2  95%   BMI 35.25 kg/m   Assessment and Plan: Problem List Items Addressed This Visit   None Visit Diagnoses     Edema, unspecified type    -  Primary   Uncertain cause but clinically stable- will get labs start lasix and potassium and aim for 1 lb or less per day weight loss f/u in 2 weeks   Relevant Medications   furosemide (LASIX) 20 MG tablet   potassium chloride (KLOR-CON M) 10 MEQ tablet   Other Relevant Orders   Basic metabolic panel   TSH + free T4        Partially dictated using Editor, commissioning. Any errors are unintentional.  Halina Maidens, MD [facility]  01/06/2023

## 2023-01-07 ENCOUNTER — Telehealth: Payer: Self-pay

## 2023-01-07 LAB — BASIC METABOLIC PANEL
BUN/Creatinine Ratio: 12 (ref 9–23)
BUN: 11 mg/dL (ref 6–24)
CO2: 19 mmol/L — ABNORMAL LOW (ref 20–29)
Calcium: 8.3 mg/dL — ABNORMAL LOW (ref 8.7–10.2)
Chloride: 105 mmol/L (ref 96–106)
Creatinine, Ser: 0.95 mg/dL (ref 0.57–1.00)
Glucose: 143 mg/dL — ABNORMAL HIGH (ref 70–99)
Potassium: 3.3 mmol/L — ABNORMAL LOW (ref 3.5–5.2)
Sodium: 142 mmol/L (ref 134–144)
eGFR: 70 mL/min/{1.73_m2} (ref >59)

## 2023-01-07 LAB — TSH+FREE T4
Free T4: 1.02 ng/dL (ref 0.82–1.77)
TSH: 2.48 u[IU]/mL (ref 0.450–4.500)

## 2023-01-07 NOTE — Telephone Encounter (Signed)
Contacted [facility] to follow up on referral for elevated ESR.   Spoke to [place], who states that pt was contacted on 1/16 and no answer. They are waiting for a call back to schedule.

## 2023-01-17 ENCOUNTER — Encounter: Payer: Self-pay | Admitting: Internal Medicine

## 2023-01-17 ENCOUNTER — Ambulatory Visit (INDEPENDENT_AMBULATORY_CARE_PROVIDER_SITE_OTHER): Payer: 59 | Admitting: Internal Medicine

## 2023-01-17 VITALS — BP 120/78 | HR 70 | Ht 63.0 in | Wt 192.8 lb

## 2023-01-17 DIAGNOSIS — I10 Essential (primary) hypertension: Secondary | ICD-10-CM

## 2023-01-17 DIAGNOSIS — Z1231 Encounter for screening mammogram for malignant neoplasm of breast: Secondary | ICD-10-CM

## 2023-01-17 DIAGNOSIS — R7303 Prediabetes: Secondary | ICD-10-CM

## 2023-01-17 DIAGNOSIS — Z0000 Encounter for general adult medical examination without abnormal findings: Secondary | ICD-10-CM

## 2023-01-17 DIAGNOSIS — F324 Major depressive disorder, single episode, in partial remission: Secondary | ICD-10-CM | POA: Diagnosis not present

## 2023-01-17 DIAGNOSIS — M545 Low back pain, unspecified: Secondary | ICD-10-CM | POA: Diagnosis not present

## 2023-01-17 DIAGNOSIS — G8929 Other chronic pain: Secondary | ICD-10-CM

## 2023-01-17 DIAGNOSIS — E782 Mixed hyperlipidemia: Secondary | ICD-10-CM

## 2023-01-17 DIAGNOSIS — I25118 Atherosclerotic heart disease of native coronary artery with other forms of angina pectoris: Secondary | ICD-10-CM

## 2023-01-17 MED ORDER — CLONAZEPAM 0.25 MG PO TBDP: 0.2500 mg | ORAL_TABLET | Freq: Two times a day (BID) | ORAL | 0 refills | Status: DC | PRN

## 2023-01-17 MED ORDER — CYCLOBENZAPRINE HCL 10 MG PO TABS: ORAL_TABLET | ORAL | 0 refills | Status: DC

## 2023-01-17 NOTE — Assessment & Plan Note (Addendum)
Symptoms worsened with family issues On Lexapro and Bupropion Recommend Psych care - provider list given

## 2023-01-17 NOTE — Assessment & Plan Note (Addendum)
Clinically stable exam with well controlled BP on hctz. Tolerating medications without side effects. Pt to continue current regimen and low sodium diet. Edema much improved - cut Lasix in half and continue 10 mg per day

## 2023-01-17 NOTE — Progress Notes (Signed)
Date:  01/17/2023   Name:  Nancy Skinner   DOB:  August 24, 1966   MRN:  [id number]   Chief Complaint: Annual Exam Nancy Skinner is a 57 y.o. female who presents today for her Complete Annual Exam. She feels poorly. She reports exercising some. She reports she is sleeping poorly. Breast complaints - none.    Mammogram: 06/2018 DEXA: none Pap smear: discontinued Colonoscopy: 03/2020 repeat 10 yrs  Health Maintenance Due  Topic Date Due   HIV Screening  Never done   DTaP/Tdap/Td (1 - Tdap) Never done   Zoster Vaccines- Shingrix (1 of 2) Never done   MAMMOGRAM  07/07/2019    Immunization History  Administered Date(s) Administered   [employer] Vaccination 09/29/2020, 10/27/2020    Hypertension This is a chronic problem. The problem is controlled. Pertinent negatives include no chest pain, headaches, palpitations or shortness of breath. Past treatments include diuretics and beta blockers. The current treatment provides significant improvement. Hypertensive end-organ damage includes CAD/MI. There is no history of kidney disease or CVA.  Hyperlipidemia This is a chronic problem. The problem is uncontrolled. Recent lipid tests were reviewed and are high. Pertinent negatives include no chest pain or shortness of breath. Current antihyperlipidemic treatment includes statins and fibric acid derivatives. The current treatment provides moderate improvement of lipids.  Depression        This is a chronic problem.The problem is unchanged.  Associated symptoms include no fatigue, no headaches and no suicidal ideas. Diabetes Diabetes type: prediabetes. Her disease course has been stable. Hypoglycemia symptoms include nervousness/anxiousness. Pertinent negatives for hypoglycemia include no dizziness, headaches or tremors. Pertinent negatives for diabetes include no chest pain, no fatigue, no polydipsia and no polyuria. Pertinent negatives for diabetic complications include no CVA.    Lab  Results  Component Value Date   NA 142 01/06/2023   K 3.3 (L) 01/06/2023   CO2 19 (L) 01/06/2023   GLUCOSE 143 (H) 01/06/2023   BUN 11 01/06/2023   CREATININE 0.95 01/06/2023   CALCIUM 8.3 (L) 01/06/2023   EGFR 70 01/06/2023   GFRNONAA >60 11/28/2022   Lab Results  Component Value Date   CHOL 166 12/05/2021   HDL 38 (L) 12/05/2021   LDLCALC 74 12/05/2021   LDLDIRECT 48 09/02/2019   TRIG 337 (H) 12/05/2021   CHOLHDL 4.4 12/05/2021   Lab Results  Component Value Date   TSH 2.480 01/06/2023   Lab Results  Component Value Date   HGBA1C 5.7 (H) 07/29/2022   Lab Results  Component Value Date   WBC 11.8 (H) 11/28/2022   HGB 12.5 11/28/2022   HCT 37.6 11/28/2022   MCV 85.3 11/28/2022   PLT 370 11/28/2022   Lab Results  Component Value Date   ALT 19 11/28/2022   AST 25 11/28/2022   ALKPHOS 60 11/28/2022   BILITOT 0.3 11/28/2022   No results found for: "25OHVITD2", "25OHVITD3", "VD25OH"   Review of Systems  Constitutional:  Negative for chills, fatigue and fever.  HENT:  Negative for congestion, hearing loss, tinnitus, trouble swallowing and voice change.   Eyes:  Negative for visual disturbance.  Respiratory:  Negative for cough, chest tightness, shortness of breath and wheezing.   Cardiovascular:  Negative for chest pain, palpitations and leg swelling.  Gastrointestinal:  Negative for abdominal pain, constipation, diarrhea and vomiting.  Endocrine: Negative for polydipsia and polyuria.  Genitourinary:  Negative for dysuria, frequency, genital sores, vaginal bleeding and vaginal discharge.  Musculoskeletal:  Negative for arthralgias, gait problem  and joint swelling.  Skin:  Negative for color change and rash.  Neurological:  Negative for dizziness, tremors, light-headedness and headaches.  Hematological:  Negative for adenopathy. Does not bruise/bleed easily.  Psychiatric/Behavioral:  Positive for depression and dysphoric mood. Negative for sleep disturbance and  suicidal ideas. The patient is nervous/anxious.     Patient Active Problem List   Diagnosis Date Noted   ESR raised 12/07/2022   Leukocytosis 08/08/2022   Status post hysterectomy 03/05/2022   Essential hypertension 04/16/2021   Hepatic steatosis 03/19/2021   Carpal tunnel syndrome of right wrist 07/10/2020   Screening for colon cancer    Coronary artery disease of native artery of native heart with stable angina pectoris ([place]) 01/18/2019   Major depression single episode, in partial remission ([place]) 78/29/5621   Neutrophilic leukocytosis 30/86/5784   Pulmonary nodule less than 6 cm determined by computed tomography of lung 07/30/2018   Night sweats 07/16/2018   Neck muscle spasm 07/15/2018   Mixed hyperlipidemia 07/15/2018   Tobacco use disorder, moderate, in sustained remission 07/15/2018   Migraine without aura and without status migrainosus, not intractable 06/24/2018   Gastroesophageal reflux disease without esophagitis 06/24/2018   Chronic midline low back pain without sciatica 06/24/2018    Allergies  Allergen Reactions   Oxycodone Other (See Comments)    "Black outs" and confusion   Dye Fdc Red [Red Dye] Other (See Comments)    Severe migraine    Past Surgical History:  Procedure Laterality Date   ABDOMINAL HYSTERECTOMY  2012   partial for bleeding   BACK SURGERY     BREAST BIOPSY Right    neg   BREAST BIOPSY Left 2015   4 bxs- neg   BREAST SURGERY Left    multiple biopsies   CARDIAC CATHETERIZATION     CATARACT EXTRACTION Right 12/19/2010   CHOLECYSTECTOMY  1999   COLONOSCOPY WITH PROPOFOL N/A 03/31/2020   Procedure: COLONOSCOPY WITH PROPOFOL;  Surgeon: Lucilla Lame, MD;  Location: [facility];  Service: Endoscopy;  Laterality: N/A;   EYE SURGERY     INTRAVASCULAR PRESSURE WIRE/FFR STUDY N/A 07/22/2018   Procedure: INTRAVASCULAR PRESSURE WIRE/FFR STUDY;  Surgeon: Nelva Bush, MD;  Location: [place] [facility];  Service: Cardiovascular;  Laterality:  N/A;   LEFT HEART CATH AND CORONARY ANGIOGRAPHY N/A 07/22/2018   Procedure: LEFT HEART CATH AND CORONARY ANGIOGRAPHY;  Surgeon: Nelva Bush, MD;  Location: [place] [facility];  Service: Cardiovascular;  Laterality: N/A;   POSTERIOR LUMBAR FUSION 4 LEVEL  2000    Social History   Tobacco Use   Smoking status: Former    Packs/day: 0.25    Years: 40.00    Total pack years: 10.00    Types: Cigarettes    Quit date: 09/18/2019    Years since quitting: 3.3   Smokeless tobacco: Never  Vaping Use   Vaping Use: Never used  Substance Use Topics   Alcohol use: Yes    Comment: less than once a week.   Drug use: Never     Medication list has been reviewed and updated.  Current Meds  Medication Sig   albuterol (VENTOLIN HFA) 108 (90 Base) MCG/ACT inhaler INHALE 2 PUFFS INTO THE LUNGS EVERY 4 HOURS AS NEEDED FOR WHEEZE OR FOR SHORTNESS OF BREATH   aspirin EC 81 MG tablet Take 81 mg by mouth daily.   atorvastatin (LIPITOR) 40 MG tablet TAKE 1 TABLET BY MOUTH EVERY DAY   baclofen (LIORESAL) 10 MG tablet Take 1 tablet (  10 mg total) by mouth 3 (three) times daily.   buPROPion (WELLBUTRIN XL) 300 MG 24 hr tablet Take 1 tablet (300 mg total) by mouth daily.   clotrimazole-betamethasone (LOTRISONE) cream APPLY 1 APPLICATION TOPICALLY 2 (TWO) TIMES DAILY. TO RASH ON LEGS   escitalopram (LEXAPRO) 20 MG tablet Take 1 tablet (20 mg total) by mouth daily.   Esomeprazole Magnesium (NEXIUM PO) Take 20 mg by mouth daily.    fenofibrate (TRICOR) 145 MG tablet Take 1 tablet (145 mg total) by mouth daily.   furosemide (LASIX) 20 MG tablet Take 1 tablet (20 mg total) by mouth daily.   hydrochlorothiazide (HYDRODIURIL) 25 MG tablet Take 1 tablet (25 mg total) by mouth daily.   ibuprofen (ADVIL) 600 MG tablet Take 1 tablet (600 mg total) by mouth every 6 (six) hours as needed.   Lidocaine 4 % PTCH Apply 1 patch topically daily as needed (pain).    metFORMIN (GLUCOPHAGE) 500 MG tablet Take 1 tablet (500 mg  total) by mouth 2 (two) times daily with a meal.   Multiple Vitamin (MULTIVITAMIN) capsule Take 1 capsule by mouth daily.   ondansetron (ZOFRAN-ODT) 4 MG disintegrating tablet Take 1 tablet (4 mg total) by mouth every 8 (eight) hours as needed for nausea or vomiting.   potassium chloride (KLOR-CON M) 10 MEQ tablet Take 1 tablet (10 mEq total) by mouth daily.   SUMAtriptan (IMITREX) 100 MG tablet TAKE 1 TABLET (100 MG TOTAL) BY MOUTH AS NEEDED FOR MIGRAINE. MAY REPEAT IN 2 HOURS IF NEEDED   triamcinolone (KENALOG) 0.025 % ointment Apply 1 application topically as needed. For rash   [DISCONTINUED] clonazePAM (KLONOPIN) 0.25 MG disintegrating tablet Take 1 tablet (0.25 mg total) by mouth 2 (two) times daily as needed.       01/17/2023    8:08 AM 01/06/2023    4:15 PM 07/29/2022    2:46 PM 05/20/2022    2:08 PM  GAD 7 : Generalized Anxiety Score  Nervous, Anxious, on Edge 3 0 0 3  Control/stop worrying 3 0 2 2  Worry too much - different things 3 0 2 2  Trouble relaxing 3  2 2   Restless 3 0 2 2  Easily annoyed or irritable 3 0 3 3  Afraid - awful might happen 3 0 0 0  Total GAD 7 Score 21  11 14   Anxiety Difficulty Extremely difficult Not difficult at all Not difficult at all Not difficult at all       01/17/2023    8:07 AM 01/06/2023    4:15 PM 07/29/2022    2:45 PM  Depression screen PHQ 2/9  Decreased Interest 3 0 3  Down, Depressed, Hopeless 3 0 2  PHQ - 2 Score 6 0 5  Altered sleeping 3 3 0  Tired, decreased energy 3 3 3   Change in appetite 0 0 0  Feeling bad or failure about yourself  3 1 1   Trouble concentrating 0 0 0  Moving slowly or fidgety/restless 0 0 0  Suicidal thoughts 0 0 0  PHQ-9 Score 15 7 9   Difficult doing work/chores Extremely dIfficult Not difficult at all Not difficult at all    BP Readings from Last 3 Encounters:  01/17/23 120/78  01/06/23 126/70  12/05/22 123/78    Physical Exam Vitals and nursing note reviewed.  Constitutional:      General: She is  not in acute distress.    Appearance: She is well-developed.  HENT:  Head: Normocephalic and atraumatic.     Right Ear: Tympanic membrane and ear canal normal.     Left Ear: Tympanic membrane and ear canal normal.     Nose:     Right Sinus: No maxillary sinus tenderness.     Left Sinus: No maxillary sinus tenderness.  Eyes:     General: No scleral icterus.       Right eye: No discharge.        Left eye: No discharge.     Conjunctiva/sclera: Conjunctivae normal.  Neck:     Thyroid: No thyromegaly.     Vascular: No carotid bruit.  Cardiovascular:     Rate and Rhythm: Normal rate and regular rhythm.     Pulses: Normal pulses.     Heart sounds: Normal heart sounds.  Pulmonary:     Effort: Pulmonary effort is normal. No respiratory distress.     Breath sounds: No wheezing.  Chest:  Breasts:    Right: No mass, nipple discharge, skin change or tenderness.     Left: No mass, nipple discharge, skin change or tenderness.  Abdominal:     General: Bowel sounds are normal.     Palpations: Abdomen is soft.     Tenderness: There is no abdominal tenderness.  Musculoskeletal:     Cervical back: Normal range of motion. No erythema.     Right lower leg: No edema.     Left lower leg: No edema.  Lymphadenopathy:     Cervical: No cervical adenopathy.  Skin:    General: Skin is warm and dry.     Capillary Refill: Capillary refill takes less than 2 seconds.     Findings: No rash.  Neurological:     General: No focal deficit present.     Mental Status: She is alert and oriented to person, place, and time.     Cranial Nerves: No cranial nerve deficit.     Sensory: No sensory deficit.     Deep Tendon Reflexes: Reflexes are normal and symmetric.  Psychiatric:        Attention and Perception: Attention normal.        Mood and Affect: Mood normal.     Wt Readings from Last 3 Encounters:  01/17/23 192 lb 12.8 oz (87.5 kg)  01/06/23 199 lb (90.3 kg)  12/05/22 184 lb 1.6 oz (83.5 kg)     BP 120/78   Pulse 70   Ht 5\' 3"  (1.6 m)   Wt 192 lb 12.8 oz (87.5 kg)   SpO2 98%   BMI 34.15 kg/m   Assessment and Plan: Problem List Items Addressed This Visit       Cardiovascular and Mediastinum   Coronary artery disease of native artery of native heart with stable angina pectoris (HCC) (Chronic)    Stable without angina On Statin and fenofibrate Previously on metoprolol and Imdur - not on med list and pt is unsure if she is taking it      Relevant Medications   clonazePAM (KLONOPIN) 0.25 MG disintegrating tablet   cyclobenzaprine (FLEXERIL) 10 MG tablet   Essential hypertension (Chronic)    Clinically stable exam with well controlled BP on hctz. Tolerating medications without side effects. Pt to continue current regimen and low sodium diet. Edema much improved - cut Lasix in half and continue 10 mg per day       Relevant Orders   CBC with Differential/Platelet   Comprehensive metabolic panel   TSH     Other  Chronic midline low back pain without sciatica   Relevant Medications   clonazePAM (KLONOPIN) 0.25 MG disintegrating tablet   cyclobenzaprine (FLEXERIL) 10 MG tablet   Major depression single episode, in partial remission (HCC)    Symptoms worsened with family issues On Lexapro and Bupropion Recommend Psych care - provider list given      Relevant Medications   clonazePAM (KLONOPIN) 0.25 MG disintegrating tablet   Other Relevant Orders   TSH   Mixed hyperlipidemia (Chronic)    Tolerating statin medication without side effects at this time.  Also on fenofibrate LDL is  Lab Results  Component Value Date   LDLCALC 74 12/05/2021  with a goal of < 70. Current dose will be adjusted if needed.       Relevant Orders   Lipid panel   Other Visit Diagnoses     Annual physical exam    -  Primary   Relevant Orders   CBC with Differential/Platelet   Comprehensive metabolic panel   Hemoglobin A1c   Lipid panel   TSH   Encounter for screening  mammogram for breast cancer       Relevant Orders   MM 3D SCREEN BREAST BILATERAL   Prediabetes       continue diet and metformin   Relevant Orders   Hemoglobin A1c        Partially dictated using Dragon software. Any errors are unintentional.  Bari Edward, MD [facility]  01/17/2023

## 2023-01-17 NOTE — Assessment & Plan Note (Addendum)
Stable without angina On Statin and fenofibrate Previously on metoprolol and Imdur - not on med list and pt is unsure if she is taking it

## 2023-01-17 NOTE — Patient Instructions (Addendum)
Call [facility] to schedule your mammogram at [phone].  Cut the Lasix back to 1/2 tab per day

## 2023-01-17 NOTE — Assessment & Plan Note (Signed)
Tolerating statin medication without side effects at this time.  Also on fenofibrate LDL is  Lab Results  Component Value Date   LDLCALC 74 12/05/2021   with a goal of < 70. Current dose will be adjusted if needed.

## 2023-01-17 NOTE — Assessment & Plan Note (Signed)
>>  ASSESSMENT AND PLAN FOR MAJOR DEPRESSION SINGLE EPISODE, IN PARTIAL REMISSION (HCC) WRITTEN ON 01/17/2023  9:32 AM BY Rashawn Rolon, Nyoka Cowden, MD  Symptoms worsened with family issues On Lexapro and Bupropion Recommend Psych care - provider list given

## 2023-01-18 LAB — COMPREHENSIVE METABOLIC PANEL
ALT: 31 IU/L (ref 0–32)
AST: 27 IU/L (ref 0–40)
Albumin/Globulin Ratio: 1.6 (ref 1.2–2.2)
Albumin: 4.2 g/dL (ref 3.8–4.9)
Alkaline Phosphatase: 77 IU/L (ref 44–121)
BUN/Creatinine Ratio: 19 (ref 9–23)
BUN: 15 mg/dL (ref 6–24)
Bilirubin Total: <0.2 mg/dL (ref 0.0–1.2)
CO2: 25 mmol/L (ref 20–29)
Calcium: 9.4 mg/dL (ref 8.7–10.2)
Chloride: 97 mmol/L (ref 96–106)
Creatinine, Ser: 0.81 mg/dL (ref 0.57–1.00)
Globulin, Total: 2.7 g/dL (ref 1.5–4.5)
Glucose: 110 mg/dL — ABNORMAL HIGH (ref 70–99)
Potassium: 3.8 mmol/L (ref 3.5–5.2)
Sodium: 138 mmol/L (ref 134–144)
Total Protein: 6.9 g/dL (ref 6.0–8.5)
eGFR: 85 mL/min/{1.73_m2} (ref >59)

## 2023-01-18 LAB — TSH: TSH: 2.38 u[IU]/mL (ref 0.450–4.500)

## 2023-01-18 LAB — CBC WITH DIFFERENTIAL/PLATELET
Basophils Absolute: 0.1 10*3/uL (ref 0.0–0.2)
Basos: 1 %
EOS (ABSOLUTE): 0.4 10*3/uL (ref 0.0–0.4)
Eos: 4 %
Hematocrit: 37.8 % (ref 34.0–46.6)
Hemoglobin: 12.7 g/dL (ref 11.1–15.9)
Immature Grans (Abs): 0.1 10*3/uL (ref 0.0–0.1)
Immature Granulocytes: 1 %
Lymphocytes Absolute: 4.4 10*3/uL — ABNORMAL HIGH (ref 0.7–3.1)
Lymphs: 46 %
MCH: 28.7 pg (ref 26.6–33.0)
MCHC: 33.6 g/dL (ref 31.5–35.7)
MCV: 86 fL (ref 79–97)
Monocytes Absolute: 0.6 10*3/uL (ref 0.1–0.9)
Monocytes: 6 %
Neutrophils Absolute: 4 10*3/uL (ref 1.4–7.0)
Neutrophils: 42 %
Platelets: 341 10*3/uL (ref 150–450)
RBC: 4.42 x10E6/uL (ref 3.77–5.28)
RDW: 13.6 % (ref 11.7–15.4)
WBC: 9.5 10*3/uL (ref 3.4–10.8)

## 2023-01-18 LAB — LIPID PANEL
Chol/HDL Ratio: 5.8 ratio — ABNORMAL HIGH (ref 0.0–4.4)
Cholesterol, Total: 227 mg/dL — ABNORMAL HIGH (ref 100–199)
HDL: 39 mg/dL — ABNORMAL LOW (ref >39)
LDL Chol Calc (NIH): 96 mg/dL (ref 0–99)
Triglycerides: 552 mg/dL (ref 0–149)
VLDL Cholesterol Cal: 92 mg/dL — ABNORMAL HIGH (ref 5–40)

## 2023-01-18 LAB — HEMOGLOBIN A1C
Est. average glucose Bld gHb Est-mCnc: 143 mg/dL
Hgb A1c MFr Bld: 6.6 % — ABNORMAL HIGH (ref 4.8–5.6)

## 2023-01-20 ENCOUNTER — Other Ambulatory Visit: Payer: Self-pay

## 2023-01-20 DIAGNOSIS — E782 Mixed hyperlipidemia: Secondary | ICD-10-CM

## 2023-01-20 MED ORDER — ROSUVASTATIN CALCIUM 40 MG PO TABS: 40.0000 mg | ORAL_TABLET | Freq: Every day | ORAL | 1 refills | Status: DC

## 2023-01-28 ENCOUNTER — Other Ambulatory Visit: Payer: Self-pay | Admitting: Internal Medicine

## 2023-01-28 DIAGNOSIS — R609 Edema, unspecified: Secondary | ICD-10-CM

## 2023-01-29 NOTE — Telephone Encounter (Signed)
Requested medication (s) are due for refill today - yes  Requested medication (s) are on the active medication list -yes  Future visit scheduled -yes  Last refill: 01/06/23 #30  Notes to clinic: Original Rx written as 1 month supply with no RF included- sent for review   Requested Prescriptions  Pending Prescriptions Disp Refills   KLOR-CON M10 10 MEQ tablet [Pharmacy Med Name: KLOR-CON M10 TABLET] 90 tablet 1    Sig: TAKE 1 [place] DAY     Endocrinology:  Minerals - Potassium Supplementation Passed - 01/28/2023  1:30 PM      Passed - K in normal range and within 360 days    Potassium  Date Value Ref Range Status  01/17/2023 3.8 3.5 - 5.2 mmol/L Final         Passed - Cr in normal range and within 360 days    Creatinine, Ser  Date Value Ref Range Status  01/17/2023 0.81 0.57 - 1.00 mg/dL Final         Passed - Valid encounter within last 12 months    Recent Outpatient Visits           1 week ago Annual physical exam   [place] at [facility], Jesse Sans, MD   3 weeks ago Edema, unspecified type   [facility] [place] at [facility], Jesse Sans, MD   4 months ago Viral upper respiratory tract infection   [place] at [facility], Jesse Sans, MD   6 months ago Essential hypertension   [place] at [facility], Jesse Sans, MD   8 months ago Epigastric pain   [place] at [facility], Jesse Sans, MD       Future Appointments             In 3 months [place] Melia Jesse Sans, MD [place] at [facility], [facility]   In 12 months Army Melia, Jesse Sans, MD [place] at [facility], [facility]               Requested Prescriptions  Pending Prescriptions Disp Refills   KLOR-CON M10 10 [place] tablet  [Pharmacy Med Name: KLOR-CON M10 TABLET] 90 tablet 1    Sig: TAKE [place]     Endocrinology:  Minerals - Potassium Supplementation Passed - 01/28/2023  1:30 PM      Passed - K in normal range and within 360 days    Potassium  Date Value Ref Range Status  01/17/2023 3.8 3.5 - 5.2 mmol/L Final         Passed - Cr in normal range and within 360 days    Creatinine, Ser  Date Value Ref Range Status  01/17/2023 0.81 0.57 - 1.00 mg/dL Final         Passed - Valid encounter within last 12 months    Recent Outpatient Visits           1 week ago Annual physical exam   [place] at [facility], Jesse Sans, MD   3 weeks ago Edema, unspecified type   [place] at [facility], Jesse Sans, MD   4 months ago Viral upper respiratory  tract infection   [place] [facility] at [facility], Jesse Sans, MD   6 months ago Essential hypertension   [place] [facility] at [facility], Jesse Sans, MD   8 months ago Epigastric pain   [place] [facility] at [facility], Jesse Sans, MD       Future Appointments             In 3 months Army Melia, Jesse Sans, MD [place] at [facility], [facility]   In 12 months Army Melia, Jesse Sans, MD [place] at [facility], [facility]

## 2023-01-31 ENCOUNTER — Telehealth: Payer: Self-pay | Admitting: *Deleted

## 2023-01-31 NOTE — Telephone Encounter (Signed)
[place] rheum tried to contact pt on 1/16 and there was no answer (per note on 1/23). Pt will need to contact them for scheduling [phone].

## 2023-01-31 NOTE — Telephone Encounter (Signed)
T called reporting that she was to have been referred to Rheumatology back in December, but she still has not heard from anyone about an appointment. Please return her call

## 2023-02-03 ENCOUNTER — Telehealth: Payer: Self-pay | Admitting: *Deleted

## 2023-02-03 NOTE — Telephone Encounter (Signed)
Call returned to patient and she was informed of missing a call from [facility]. I gave er their number and she will call them to get scheduled

## 2023-02-03 NOTE — Telephone Encounter (Signed)
Patient called [facility] and was told that we need to send a new referral to them for her to be seen

## 2023-02-03 NOTE — Telephone Encounter (Signed)
New referral faxed to [facility] rheumatology

## 2023-02-17 ENCOUNTER — Other Ambulatory Visit
Admission: RE | Admit: 2023-02-17 | Discharge: 2023-02-17 | Disposition: A | Discharge status: Home / Self Care | Payer: 59 | Attending: Internal Medicine | Admitting: Internal Medicine

## 2023-02-17 ENCOUNTER — Ambulatory Visit (INDEPENDENT_AMBULATORY_CARE_PROVIDER_SITE_OTHER): Payer: 59 | Admitting: Internal Medicine

## 2023-02-17 ENCOUNTER — Encounter: Payer: Self-pay | Admitting: Internal Medicine

## 2023-02-17 VITALS — BP 124/78 | HR 87 | Ht 63.0 in | Wt 200.0 lb

## 2023-02-17 DIAGNOSIS — M2550 Pain in unspecified joint: Secondary | ICD-10-CM | POA: Diagnosis not present

## 2023-02-17 DIAGNOSIS — F324 Major depressive disorder, single episode, in partial remission: Secondary | ICD-10-CM

## 2023-02-17 DIAGNOSIS — M791 Myalgia, unspecified site: Secondary | ICD-10-CM | POA: Diagnosis present

## 2023-02-17 LAB — HEPATIC FUNCTION PANEL
ALT: 34 U/L (ref 0–44)
AST: 35 U/L (ref 15–41)
Albumin: 3.9 g/dL (ref 3.5–5.0)
Alkaline Phosphatase: 71 U/L (ref 38–126)
Bilirubin, Direct: <0.1 mg/dL (ref 0.0–0.2)
Total Bilirubin: 0.1 mg/dL — ABNORMAL LOW (ref 0.3–1.2)
Total Protein: 7.2 g/dL (ref 6.5–8.1)

## 2023-02-17 LAB — CK: Total CK: 161 U/L (ref 38–234)

## 2023-02-17 LAB — SEDIMENTATION RATE: Sed Rate: 68 mm/hr — ABNORMAL HIGH (ref 0–30)

## 2023-02-17 MED ORDER — CARIPRAZINE HCL 1.5 MG PO CAPS: 1.5000 mg | ORAL_CAPSULE | Freq: Every day | ORAL | 0 refills | Status: DC

## 2023-02-17 NOTE — Patient Instructions (Addendum)
Put Crestor aside.  Wait 2 weeks to assess your symptoms.  If much improved, go back to Atorvastatin 40 mg.  Take Vraylar once a day, along with your current medications.

## 2023-02-17 NOTE — Assessment & Plan Note (Signed)
Symptoms not well controlled on Lexapro and Bupropion Will add Vraylar 1.5 mg daily F/u 2 weeks

## 2023-02-17 NOTE — Progress Notes (Signed)
Date:  02/17/2023   Name:  Nancy Skinner   DOB:  20-Feb-1966   MRN:  [id number]   Chief Complaint: Pain Management (Bones are hurting- started 2 weeks ago. Started with hands going numb and tingling- then progressed. ), Anxiety (Patient said she is angry at [facility] and does not know why. Patient tried to find a physiciatrist and cannot find someone who takes her insurance or takes new patients. ), and Depression  Anxiety Presents for follow-up visit. Symptoms include decreased concentration, excessive worry, irritability, nervous/anxious behavior and restlessness. Patient reports no chest pain, dizziness, palpitations, shortness of breath or suicidal ideas.    Depression        This is a chronic problem.The problem is unchanged.  Associated symptoms include decreased concentration, irritable, restlessness, decreased interest and myalgias.  Associated symptoms include no fatigue, no headaches and no suicidal ideas.     The symptoms are aggravated by family issues and work stress.  Treatments tried: Lexapro and bupropion.  Past medical history includes anxiety.   Hyperlipidemia Associated symptoms include myalgias. Pertinent negatives include no chest pain or shortness of breath.    Lab Results  Component Value Date   NA 138 01/17/2023   K 3.8 01/17/2023   CO2 25 01/17/2023   GLUCOSE 110 (H) 01/17/2023   BUN 15 01/17/2023   CREATININE 0.81 01/17/2023   CALCIUM 9.4 01/17/2023   EGFR 85 01/17/2023   GFRNONAA >60 11/28/2022   Lab Results  Component Value Date   CHOL 227 (H) 01/17/2023   HDL 39 (L) 01/17/2023   LDLCALC 96 01/17/2023   LDLDIRECT 48 09/02/2019   TRIG 552 (HH) 01/17/2023   CHOLHDL 5.8 (H) 01/17/2023   Lab Results  Component Value Date   TSH 2.380 01/17/2023   Lab Results  Component Value Date   HGBA1C 6.6 (H) 01/17/2023   Lab Results  Component Value Date   WBC 9.5 01/17/2023   HGB 12.7 01/17/2023   HCT 37.8 01/17/2023   MCV 86 01/17/2023   PLT 341  01/17/2023   Lab Results  Component Value Date   ALT 31 01/17/2023   AST 27 01/17/2023   ALKPHOS 77 01/17/2023   BILITOT <0.2 01/17/2023   No results found for: "25OHVITD2", "25OHVITD3", "VD25OH"   Review of Systems  Constitutional:  Positive for irritability. Negative for fatigue and unexpected weight change.  HENT:  Negative for nosebleeds and trouble swallowing.   Eyes:  Negative for visual disturbance.  Respiratory:  Negative for cough, chest tightness, shortness of breath and wheezing.   Cardiovascular:  Negative for chest pain, palpitations and leg swelling.  Gastrointestinal:  Negative for abdominal pain, constipation and diarrhea.  Musculoskeletal:  Positive for arthralgias and myalgias.  Neurological:  Negative for dizziness, weakness, light-headedness and headaches.  Psychiatric/Behavioral:  Positive for decreased concentration and depression. Negative for suicidal ideas. The patient is nervous/anxious.     Patient Active Problem List   Diagnosis Date Noted   ESR raised 12/07/2022   Leukocytosis 08/08/2022   Status post hysterectomy 03/05/2022   Essential hypertension 04/16/2021   Hepatic steatosis 03/19/2021   Carpal tunnel syndrome of right wrist 07/10/2020   Screening for colon cancer    Coronary artery disease of native artery of native heart with stable angina pectoris ([place]) 01/18/2019   Major depression single episode, in partial remission ([place]) 123456   Neutrophilic leukocytosis [id number]   Pulmonary nodule less than 6 cm determined by computed tomography of lung 07/30/2018   Night sweats  07/16/2018   Neck muscle spasm 07/15/2018   Mixed hyperlipidemia 07/15/2018   Tobacco use disorder, moderate, in sustained remission 07/15/2018   Migraine without aura and without status migrainosus, not intractable 06/24/2018   Gastroesophageal reflux disease without esophagitis 06/24/2018   Chronic midline low back pain without sciatica 06/24/2018    Allergies   Allergen Reactions   Oxycodone Other (See Comments)    "Black outs" and confusion   Dye Fdc Red [Red Dye] Other (See Comments)    Severe migraine    Past Surgical History:  Procedure Laterality Date   ABDOMINAL HYSTERECTOMY  2012   partial for bleeding   BACK SURGERY     BREAST BIOPSY Right    neg   BREAST BIOPSY Left 2015   4 bxs- neg   BREAST SURGERY Left    multiple biopsies   CARDIAC CATHETERIZATION     CATARACT EXTRACTION Right 12/19/2010   CHOLECYSTECTOMY  1999   COLONOSCOPY WITH PROPOFOL N/A 03/31/2020   Procedure: COLONOSCOPY WITH PROPOFOL;  Surgeon: Lucilla Lame, MD;  Location: [facility] ENDOSCOPY;  Service: Endoscopy;  Laterality: N/A;   EYE SURGERY     INTRAVASCULAR PRESSURE WIRE/FFR STUDY N/A 07/22/2018   Procedure: INTRAVASCULAR PRESSURE WIRE/FFR STUDY;  Surgeon: Nelva Bush, MD;  Location: [place] [facility];  Service: Cardiovascular;  Laterality: N/A;   LEFT HEART CATH AND CORONARY ANGIOGRAPHY N/A 07/22/2018   Procedure: LEFT HEART CATH AND CORONARY ANGIOGRAPHY;  Surgeon: Nelva Bush, MD;  Location: [place] [facility];  Service: Cardiovascular;  Laterality: N/A;   POSTERIOR LUMBAR FUSION 4 LEVEL  2000    Social History   Tobacco Use   Smoking status: Former    Packs/day: 0.25    Years: 40.00    Total pack years: 10.00    Types: Cigarettes    Quit date: 09/18/2019    Years since quitting: 3.4   Smokeless tobacco: Never  Vaping Use   Vaping Use: Never used  Substance Use Topics   Alcohol use: Yes    Comment: less than once a week.   Drug use: Never     Medication list has been reviewed and updated.  Current Meds  Medication Sig   albuterol (VENTOLIN HFA) 108 (90 Base) MCG/ACT inhaler INHALE 2 PUFFS INTO THE LUNGS EVERY 4 HOURS AS NEEDED FOR WHEEZE OR FOR SHORTNESS OF BREATH   aspirin EC 81 MG tablet Take 81 mg by mouth daily.   baclofen (LIORESAL) 10 MG tablet Take 1 tablet (10 mg total) by mouth 3 (three) times daily.   buPROPion  (WELLBUTRIN XL) 300 MG 24 hr tablet Take 1 tablet (300 mg total) by mouth daily.   cariprazine (VRAYLAR) 1.5 MG capsule Take 1 capsule (1.5 mg total) by mouth daily.   clonazePAM (KLONOPIN) 0.25 MG disintegrating tablet Take 1 tablet (0.25 mg total) by mouth 2 (two) times daily as needed.   clotrimazole-betamethasone (LOTRISONE) cream APPLY 1 APPLICATION TOPICALLY 2 (TWO) TIMES DAILY. TO RASH ON LEGS   cyclobenzaprine (FLEXERIL) 10 MG tablet TAKE 1 TABLET BY MOUTH THREE TIMES A DAY AS NEEDED FOR MUSCLE SPASMS   escitalopram (LEXAPRO) 20 MG tablet Take 1 tablet (20 mg total) by mouth daily.   Esomeprazole Magnesium (NEXIUM PO) Take 20 mg by mouth daily.    fenofibrate (TRICOR) 145 MG tablet Take 1 tablet (145 mg total) by mouth daily.   furosemide (LASIX) 20 MG tablet TAKE 1 TABLET BY MOUTH EVERY DAY   hydrochlorothiazide (HYDRODIURIL) 25 MG tablet Take 1  tablet (25 mg total) by mouth daily.   ibuprofen (ADVIL) 600 MG tablet Take 1 tablet (600 mg total) by mouth every 6 (six) hours as needed.   KLOR-CON M10 10 MEQ tablet TAKE 1 TABLET BY MOUTH EVERY DAY   Lidocaine 4 % PTCH Apply 1 patch topically daily as needed (pain).    metFORMIN (GLUCOPHAGE) 500 MG tablet Take 1 tablet (500 mg total) by mouth 2 (two) times daily with a meal.   Multiple Vitamin (MULTIVITAMIN) capsule Take 1 capsule by mouth daily.   nitroGLYCERIN (NITROSTAT) 0.4 MG SL tablet Place 1 tablet (0.4 mg total) under the tongue every 5 (five) minutes as needed for chest pain.   ondansetron (ZOFRAN-ODT) 4 MG disintegrating tablet Take 1 tablet (4 mg total) by mouth every 8 (eight) hours as needed for nausea or vomiting.   rosuvastatin (CRESTOR) 40 MG tablet Take 1 tablet (40 mg total) by mouth daily.   SUMAtriptan (IMITREX) 100 MG tablet TAKE 1 TABLET (100 MG TOTAL) BY MOUTH AS NEEDED FOR MIGRAINE. MAY REPEAT IN 2 HOURS IF NEEDED   triamcinolone (KENALOG) 0.025 % ointment Apply 1 application topically as needed. For rash        02/17/2023    2:00 PM 01/17/2023    8:08 AM 01/06/2023    4:15 PM 07/29/2022    2:46 PM  GAD 7 : Generalized Anxiety Score  Nervous, Anxious, on Edge 0 3 0 0  Control/stop worrying 0 3 0 2  Worry too much - different things 0 3 0 2  Trouble relaxing '3 3  2  '$ Restless 3 3 0 2  Easily annoyed or irritable 3 3 0 3  Afraid - awful might happen 0 3 0 0  Total GAD 7 Score '9 21  11  '$ Anxiety Difficulty Extremely difficult Extremely difficult Not difficult at all Not difficult at all       02/17/2023    1:59 PM 01/17/2023    8:07 AM 01/06/2023    4:15 PM  Depression screen PHQ 2/9  Decreased Interest 2 3 0  Down, Depressed, Hopeless 3 3 0  PHQ - 2 Score 5 6 0  Altered sleeping '3 3 3  '$ Tired, decreased energy '3 3 3  '$ Change in appetite 3 0 0  Feeling bad or failure about yourself  '3 3 1  '$ Trouble concentrating 0 0 0  Moving slowly or fidgety/restless 0 0 0  Suicidal thoughts 0 0 0  PHQ-9 Score '17 15 7  '$ Difficult doing work/chores Very difficult Extremely dIfficult Not difficult at all    BP Readings from Last 3 Encounters:  02/17/23 124/78  01/17/23 120/78  01/06/23 126/70    Physical Exam Vitals and nursing note reviewed.  Constitutional:      General: She is irritable. She is not in acute distress.    Appearance: Normal appearance. She is well-developed.  HENT:     Head: Normocephalic and atraumatic.  Cardiovascular:     Rate and Rhythm: Normal rate and regular rhythm.  Pulmonary:     Effort: Pulmonary effort is normal. No respiratory distress.     Breath sounds: No wheezing or rhonchi.  Musculoskeletal:     Right hand: Tenderness present. No swelling or deformity.     Left hand: Tenderness present. No swelling.     Cervical back: Normal range of motion.     Right knee: Normal.     Left knee: Normal.     Comments: No focal tenderness Thigh muscles  tender to palpation  Lymphadenopathy:     Cervical: No cervical adenopathy.  Skin:    General: Skin is warm and dry.      Findings: No rash.  Neurological:     Mental Status: She is alert and oriented to person, place, and time.  Psychiatric:        Mood and Affect: Mood normal.        Behavior: Behavior normal.     Wt Readings from Last 3 Encounters:  02/17/23 200 lb (90.7 kg)  01/17/23 192 lb 12.8 oz (87.5 kg)  01/06/23 199 lb (90.3 kg)    BP 124/78   Pulse 87   Ht '5\' 3"'$  (1.6 m)   Wt 200 lb (90.7 kg)   SpO2 95%   BMI 35.43 kg/m   Assessment and Plan: Problem List Items Addressed This Visit       Other   Major depression single episode, in partial remission ([place])    Symptoms not well controlled on Lexapro and Bupropion Will add Vraylar 1.5 mg daily F/u 2 weeks      Relevant Medications   cariprazine (VRAYLAR) 1.5 MG capsule   Other Visit Diagnoses     Myalgia    -  Primary   worse since starting Crestor check labs and hold Crestor x 2 weeks   Relevant Orders   Hepatic function panel   CK   Arthralgia, unspecified joint       since starting Crestor also Fam hx RA   Relevant Orders   Sedimentation rate   Rheumatoid factor        Partially dictated using Editor, commissioning. Any errors are unintentional.  Halina Maidens, MD [facility]  02/17/2023

## 2023-02-17 NOTE — Assessment & Plan Note (Signed)
>>  ASSESSMENT AND PLAN FOR MAJOR DEPRESSION SINGLE EPISODE, IN PARTIAL REMISSION ([facility]CC) WRITTEN ON 02/17/2023  2:37 PM BY Sheldon Sem H, MD  Symptoms not well controlled on Lexapro and Bupropion Will add Vraylar 1.5 mg daily F/u 2 weeks

## 2023-02-18 ENCOUNTER — Ambulatory Visit: Payer: 59 | Admitting: Internal Medicine

## 2023-02-18 ENCOUNTER — Encounter: Payer: Self-pay | Admitting: Internal Medicine

## 2023-02-18 LAB — RHEUMATOID FACTOR: Rheumatoid fact SerPl-aCnc: 10.4 IU/mL (ref <14.0)

## 2023-02-19 ENCOUNTER — Other Ambulatory Visit: Payer: Self-pay | Admitting: Internal Medicine

## 2023-02-19 DIAGNOSIS — J209 Acute bronchitis, unspecified: Secondary | ICD-10-CM

## 2023-02-19 DIAGNOSIS — G8929 Other chronic pain: Secondary | ICD-10-CM

## 2023-02-19 DIAGNOSIS — G43009 Migraine without aura, not intractable, without status migrainosus: Secondary | ICD-10-CM

## 2023-02-19 DIAGNOSIS — F321 Major depressive disorder, single episode, moderate: Secondary | ICD-10-CM

## 2023-02-26 ENCOUNTER — Other Ambulatory Visit: Payer: Self-pay | Admitting: Internal Medicine

## 2023-02-26 ENCOUNTER — Telehealth: Payer: Self-pay

## 2023-02-26 DIAGNOSIS — M353 Polymyalgia rheumatica: Secondary | ICD-10-CM | POA: Insufficient documentation

## 2023-02-26 DIAGNOSIS — F324 Major depressive disorder, single episode, in partial remission: Secondary | ICD-10-CM

## 2023-02-26 MED ORDER — CARIPRAZINE HCL 1.5 MG PO CAPS: 1.5000 mg | ORAL_CAPSULE | Freq: Every day | ORAL | 0 refills | Status: DC

## 2023-02-26 MED ORDER — PREDNISONE 10 MG PO TABS: 50.0000 mg | ORAL_TABLET | Freq: Every day | ORAL | 0 refills | Status: DC

## 2023-02-26 NOTE — Telephone Encounter (Signed)
Patient informed. Will follow up in one month.  Nancy Skinner

## 2023-02-26 NOTE — Telephone Encounter (Signed)
Patient contacted the office and wanted to let Dr Army Melia know she has stopped the statin medication now for weeks and her body is still aching all over. Wants advice on next steps.  Also, she is almost out of her vraylar samples and would like a fill sent to [facility] in [place].  Rehaan Viloria

## 2023-03-05 ENCOUNTER — Ambulatory Visit (INDEPENDENT_AMBULATORY_CARE_PROVIDER_SITE_OTHER): Payer: 59 | Admitting: Internal Medicine

## 2023-03-05 ENCOUNTER — Encounter: Payer: Self-pay | Admitting: Internal Medicine

## 2023-03-05 VITALS — BP 132/74 | HR 97 | Ht 63.0 in | Wt 195.0 lb

## 2023-03-05 DIAGNOSIS — F324 Major depressive disorder, single episode, in partial remission: Secondary | ICD-10-CM | POA: Diagnosis not present

## 2023-03-05 DIAGNOSIS — Z6834 Body mass index (BMI) 34.0-34.9, adult: Secondary | ICD-10-CM | POA: Diagnosis not present

## 2023-03-05 DIAGNOSIS — M353 Polymyalgia rheumatica: Secondary | ICD-10-CM

## 2023-03-05 DIAGNOSIS — F321 Major depressive disorder, single episode, moderate: Secondary | ICD-10-CM | POA: Insufficient documentation

## 2023-03-05 DIAGNOSIS — E118 Type 2 diabetes mellitus with unspecified complications: Secondary | ICD-10-CM | POA: Diagnosis not present

## 2023-03-05 DIAGNOSIS — E119 Type 2 diabetes mellitus without complications: Secondary | ICD-10-CM | POA: Insufficient documentation

## 2023-03-05 NOTE — Assessment & Plan Note (Addendum)
Vraylar 1.5 mg added last visit with excellent response She continued on lexapro and Bupropion Continue current therapy

## 2023-03-05 NOTE — Assessment & Plan Note (Signed)
She wants to try the compounded Ozempic from [facility] We will discuss at next visit Will need to adjust dose gradually to avoid the rapid weight loss she has had in the past

## 2023-03-05 NOTE — Assessment & Plan Note (Addendum)
Suspected cause of myalgias - she is much improved on prednisone 50 mg Crestor was restarted and she is not having side effects Will continue 50 mg per day for three more weeks then OV and ESR

## 2023-03-05 NOTE — Assessment & Plan Note (Signed)
Wants to try ozempic again- per insurance, this is a step edit medication We will need to monitor A1C on prednisone as well. Will discuss compounded ozempic at next visit

## 2023-03-05 NOTE — Assessment & Plan Note (Signed)
>>  ASSESSMENT AND PLAN FOR MAJOR DEPRESSION SINGLE EPISODE, IN PARTIAL REMISSION (HCC) WRITTEN ON 03/05/2023  2:02 PM BY Austyn Perriello, Nyoka Cowden, MD  Vraylar 1.5 mg added last visit with excellent response She continued on lexapro and Bupropion Continue current therapy

## 2023-03-05 NOTE — Progress Notes (Signed)
Date:  03/05/2023   Name:  Nancy Skinner   DOB:  03/28/1966   MRN:  [id number]   Chief Complaint: Depression and PMR  Depression        This is a chronic problem.  The problem occurs daily.  The problem has been gradually improving since onset.  Associated symptoms include myalgias (much improved).  Associated symptoms include no fatigue.  Past treatments include SSRIs - Selective serotonin reuptake inhibitors (with bupropion and Vraylar). Myalgias - stopped Crestor as a trial.  Labs returned with high ESR and borderline high RF.  Prednisone 50 mg daily started one week ago with marked improvement in muscle aches and pains.  Lab Results  Component Value Date   ESRSEDRATE 68 (H) 02/17/2023   Lab Results  Component Value Date   RF 10.4 02/17/2023     Lab Results  Component Value Date   NA 138 01/17/2023   K 3.8 01/17/2023   CO2 25 01/17/2023   GLUCOSE 110 (H) 01/17/2023   BUN 15 01/17/2023   CREATININE 0.81 01/17/2023   CALCIUM 9.4 01/17/2023   EGFR 85 01/17/2023   GFRNONAA >60 11/28/2022   Lab Results  Component Value Date   CHOL 227 (H) 01/17/2023   HDL 39 (L) 01/17/2023   LDLCALC 96 01/17/2023   LDLDIRECT 48 09/02/2019   TRIG 552 (HH) 01/17/2023   CHOLHDL 5.8 (H) 01/17/2023   Lab Results  Component Value Date   TSH 2.380 01/17/2023   Lab Results  Component Value Date   HGBA1C 6.6 (H) 01/17/2023   Lab Results  Component Value Date   WBC 9.5 01/17/2023   HGB 12.7 01/17/2023   HCT 37.8 01/17/2023   MCV 86 01/17/2023   PLT 341 01/17/2023   Lab Results  Component Value Date   ALT 34 02/17/2023   AST 35 02/17/2023   ALKPHOS 71 02/17/2023   BILITOT 0.1 (L) 02/17/2023   No results found for: "25OHVITD2", "25OHVITD3", "VD25OH"   Review of Systems  Constitutional:  Negative for chills, fatigue and fever.  Respiratory:  Negative for chest tightness and shortness of breath.   Cardiovascular:  Negative for chest pain.  Musculoskeletal:  Positive for  myalgias (much improved).  Psychiatric/Behavioral:  Positive for depression and dysphoric mood. Negative for sleep disturbance. The patient is nervous/anxious.     Patient Active Problem List   Diagnosis Date Noted   BMI 34.0-34.9,adult 03/05/2023   Type II diabetes mellitus with complication ([place]) [id number]   Current moderate episode of major depressive disorder without prior episode ([place]) 03/05/2023   PMR (polymyalgia rheumatica) ([place]) 02/26/2023   ESR raised 12/07/2022   Leukocytosis 08/08/2022   Status post hysterectomy 03/05/2022   Essential hypertension 04/16/2021   Hepatic steatosis 03/19/2021   Carpal tunnel syndrome of right wrist 07/10/2020   Screening for colon cancer    Coronary artery disease of native artery of native heart with stable angina pectoris ([place]) 01/18/2019   Major depression single episode, in partial remission ([place]) 123456   Neutrophilic leukocytosis [id number]   Pulmonary nodule less than 6 cm determined by computed tomography of lung 07/30/2018   Night sweats 07/16/2018   Neck muscle spasm 07/15/2018   Mixed hyperlipidemia 07/15/2018   Tobacco use disorder, moderate, in sustained remission 07/15/2018   Migraine without aura and without status migrainosus, not intractable 06/24/2018   Gastroesophageal reflux disease without esophagitis 06/24/2018   Chronic midline low back pain without sciatica 06/24/2018    Allergies  Allergen Reactions  Oxycodone Other (See Comments)    "Black outs" and confusion   Dye Fdc Red [Red Dye] Other (See Comments)    Severe migraine    Past Surgical History:  Procedure Laterality Date   ABDOMINAL HYSTERECTOMY  2012   partial for bleeding   BACK SURGERY     BREAST BIOPSY Right    neg   BREAST BIOPSY Left 2015   4 bxs- neg   BREAST SURGERY Left    multiple biopsies   CARDIAC CATHETERIZATION     CATARACT EXTRACTION Right 12/19/2010   CHOLECYSTECTOMY  1999   COLONOSCOPY WITH PROPOFOL N/A 03/31/2020    Procedure: COLONOSCOPY WITH PROPOFOL;  Surgeon: Lucilla Lame, MD;  Location: [facility] ENDOSCOPY;  Service: Endoscopy;  Laterality: N/A;   EYE SURGERY     INTRAVASCULAR PRESSURE WIRE/FFR STUDY N/A 07/22/2018   Procedure: INTRAVASCULAR PRESSURE WIRE/FFR STUDY;  Surgeon: Nelva Bush, MD;  Location: [place] [facility];  Service: Cardiovascular;  Laterality: N/A;   LEFT HEART CATH AND CORONARY ANGIOGRAPHY N/A 07/22/2018   Procedure: LEFT HEART CATH AND CORONARY ANGIOGRAPHY;  Surgeon: Nelva Bush, MD;  Location: [place] [facility];  Service: Cardiovascular;  Laterality: N/A;   POSTERIOR LUMBAR FUSION 4 LEVEL  2000    Social History   Tobacco Use   Smoking status: Former    Packs/day: 0.25    Years: 40.00    Additional pack years: 0.00    Total pack years: 10.00    Types: Cigarettes    Quit date: 09/18/2019    Years since quitting: 3.4   Smokeless tobacco: Never  Vaping Use   Vaping Use: Never used  Substance Use Topics   Alcohol use: Yes    Comment: less than once a week.   Drug use: Never     Medication list has been reviewed and updated.  Current Meds  Medication Sig   albuterol (VENTOLIN HFA) 108 (90 Base) MCG/ACT inhaler INHALE 2 PUFFS INTO THE LUNGS EVERY 4 HOURS AS NEEDED FOR WHEEZE OR FOR SHORTNESS OF BREATH   aspirin EC 81 MG tablet Take 81 mg by mouth daily.   baclofen (LIORESAL) 10 MG tablet Take 1 tablet (10 mg total) by mouth 3 (three) times daily.   buPROPion (WELLBUTRIN XL) 300 MG 24 hr tablet Take 1 tablet (300 mg total) by mouth daily.   cariprazine (VRAYLAR) 1.5 MG capsule Take 1 capsule (1.5 mg total) by mouth daily.   clonazePAM (KLONOPIN) 0.25 MG disintegrating tablet Take 1 tablet (0.25 mg total) by mouth 2 (two) times daily as needed.   clotrimazole-betamethasone (LOTRISONE) cream APPLY 1 APPLICATION TOPICALLY 2 (TWO) TIMES DAILY. TO RASH ON LEGS   cyclobenzaprine (FLEXERIL) 10 MG tablet TAKE 1 TABLET BY MOUTH THREE TIMES A DAY AS NEEDED FOR MUSCLE SPASM    escitalopram (LEXAPRO) 20 MG tablet TAKE 1 TABLET BY MOUTH EVERY DAY   Esomeprazole Magnesium (NEXIUM PO) Take 20 mg by mouth daily.    fenofibrate (TRICOR) 145 MG tablet Take 1 tablet (145 mg total) by mouth daily.   furosemide (LASIX) 20 MG tablet TAKE 1 TABLET BY MOUTH EVERY DAY   hydrochlorothiazide (HYDRODIURIL) 25 MG tablet Take 1 tablet (25 mg total) by mouth daily.   ibuprofen (ADVIL) 600 MG tablet Take 1 tablet (600 mg total) by mouth every 6 (six) hours as needed.   KLOR-CON M10 10 MEQ tablet TAKE 1 TABLET BY MOUTH EVERY DAY   Lidocaine 4 % PTCH Apply 1 patch topically daily as needed (pain).  metFORMIN (GLUCOPHAGE) 500 MG tablet TAKE 1 TABLET BY MOUTH 2 TIMES DAILY WITH A MEAL.   Multiple Vitamin (MULTIVITAMIN) capsule Take 1 capsule by mouth daily.   ondansetron (ZOFRAN-ODT) 4 MG disintegrating tablet Take 1 tablet (4 mg total) by mouth every 8 (eight) hours as needed for nausea or vomiting.   predniSONE (DELTASONE) 10 MG tablet Take 5 tablets (50 mg total) by mouth daily with breakfast.   rosuvastatin (CRESTOR) 40 MG tablet Take 1 tablet (40 mg total) by mouth daily.   SUMAtriptan (IMITREX) 100 MG tablet TAKE 1 TABLET (100 MG TOTAL) BY MOUTH AS NEEDED FOR MIGRAINE. MAY REPEAT IN 2 HOURS IF NEEDED   triamcinolone (KENALOG) 0.025 % ointment Apply 1 application topically as needed. For rash       03/05/2023    1:50 PM 02/17/2023    2:00 PM 01/17/2023    8:08 AM 01/06/2023    4:15 PM  GAD 7 : Generalized Anxiety Score  Nervous, Anxious, on Edge 1 0 3 0  Control/stop worrying 2 0 3 0  Worry too much - different things 2 0 3 0  Trouble relaxing 2 3 3    Restless 1 3 3  0  Easily annoyed or irritable 3 3 3  0  Afraid - awful might happen 1 0 3 0  Total GAD 7 Score 12 9 21    Anxiety Difficulty Somewhat difficult Extremely difficult Extremely difficult Not difficult at all       03/05/2023    1:50 PM 02/17/2023    1:59 PM 01/17/2023    8:07 AM  Depression screen PHQ 2/9  Decreased  Interest 1 2 3   Down, Depressed, Hopeless 2 3 3   PHQ - 2 Score 3 5 6   Altered sleeping 1 3 3   Tired, decreased energy 2 3 3   Change in appetite 3 3 0  Feeling bad or failure about yourself  2 3 3   Trouble concentrating 0 0 0  Moving slowly or fidgety/restless 0 0 0  Suicidal thoughts 0 0 0  PHQ-9 Score 11 17 15   Difficult doing work/chores Not difficult at all Very difficult Extremely dIfficult    BP Readings from Last 3 Encounters:  03/05/23 132/74  02/17/23 124/78  01/17/23 120/78    Physical Exam Vitals and nursing note reviewed.  Constitutional:      General: She is not in acute distress.    Appearance: Normal appearance. She is well-developed.  HENT:     Head: Normocephalic and atraumatic.  Cardiovascular:     Rate and Rhythm: Normal rate and regular rhythm.  Pulmonary:     Effort: Pulmonary effort is normal. No respiratory distress.     Breath sounds: No wheezing or rhonchi.  Musculoskeletal:     Right lower leg: No edema.     Left lower leg: No edema.  Skin:    General: Skin is warm and dry.     Findings: No rash.  Neurological:     Mental Status: She is alert and oriented to person, place, and time.  Psychiatric:        Mood and Affect: Mood normal.        Behavior: Behavior normal.     Wt Readings from Last 3 Encounters:  03/05/23 195 lb (88.5 kg)  02/17/23 200 lb (90.7 kg)  01/17/23 192 lb 12.8 oz (87.5 kg)    BP 132/74   Pulse 97   Ht 5\' 3"  (1.6 m)   Wt 195 lb (88.5 kg)  SpO2 97%   BMI 34.54 kg/m   Assessment and Plan:  Problem List Items Addressed This Visit       Endocrine   Type II diabetes mellitus with complication ([place])    Wants to try ozempic again- per insurance, this is a step edit medication We will need to monitor A1C on prednisone as well. Will discuss compounded ozempic at next visit        Other   BMI 34.0-34.9,adult    She wants to try the compounded Ozempic from [facility] We will discuss at next visit Will  need to adjust dose gradually to avoid the rapid weight loss she has had in the past      Major depression single episode, in partial remission ([place]) - Primary    Vraylar 1.5 mg added last visit with excellent response She continued on lexapro and Bupropion Continue current therapy      PMR (polymyalgia rheumatica) (HCC)    Suspected cause of myalgias - she is much improved on prednisone 50 mg Crestor was restarted and she is not having side effects Will continue 50 mg per day for three more weeks then OV and ESR       Return in about 3 weeks (around 03/26/2023) for PMR.   Partially dictated using [place], any errors are not intentional.  Glean Hess, MD [place], [place]

## 2023-03-21 ENCOUNTER — Ambulatory Visit (INDEPENDENT_AMBULATORY_CARE_PROVIDER_SITE_OTHER): Payer: 59 | Admitting: Internal Medicine

## 2023-03-21 ENCOUNTER — Encounter: Payer: Self-pay | Admitting: Internal Medicine

## 2023-03-21 VITALS — BP 126/74 | HR 63 | Ht 63.0 in | Wt 190.0 lb

## 2023-03-21 DIAGNOSIS — K439 Ventral hernia without obstruction or gangrene: Secondary | ICD-10-CM | POA: Diagnosis not present

## 2023-03-21 NOTE — Progress Notes (Signed)
Date:  03/21/2023   Name:  Nancy Skinner   DOB:  03-14-1966   MRN:  [id number]   Chief Complaint: Hernia (Hernia above umbilicus is now painful after vomiting at home a few days ago. She has been waking up with acid reflux and its been causing her to feel like she will throw up.)  Abdominal Pain This is a new problem. The current episode started in the past 7 days. The problem has been gradually worsening. The pain is located in the epigastric region. The pain is moderate. The quality of the pain is cramping and a sensation of fullness. Associated symptoms include arthralgias and myalgias. Pertinent negatives include no constipation, diarrhea, fever, nausea or vomiting.    Lab Results  Component Value Date   NA 138 01/17/2023   K 3.8 01/17/2023   CO2 25 01/17/2023   GLUCOSE 110 (H) 01/17/2023   BUN 15 01/17/2023   CREATININE 0.81 01/17/2023   CALCIUM 9.4 01/17/2023   EGFR 85 01/17/2023   GFRNONAA >60 11/28/2022   Lab Results  Component Value Date   CHOL 227 (H) 01/17/2023   HDL 39 (L) 01/17/2023   LDLCALC 96 01/17/2023   LDLDIRECT 48 09/02/2019   TRIG 552 (HH) 01/17/2023   CHOLHDL 5.8 (H) 01/17/2023   Lab Results  Component Value Date   TSH 2.380 01/17/2023   Lab Results  Component Value Date   HGBA1C 6.6 (H) 01/17/2023   Lab Results  Component Value Date   WBC 9.5 01/17/2023   HGB 12.7 01/17/2023   HCT 37.8 01/17/2023   MCV 86 01/17/2023   PLT 341 01/17/2023   Lab Results  Component Value Date   ALT 34 02/17/2023   AST 35 02/17/2023   ALKPHOS 71 02/17/2023   BILITOT 0.1 (L) 02/17/2023   No results found for: "25OHVITD2", "25OHVITD3", "VD25OH"   Review of Systems  Constitutional:  Negative for fever.  Gastrointestinal:  Positive for abdominal pain. Negative for constipation, diarrhea, nausea, rectal pain and vomiting.  Musculoskeletal:  Positive for arthralgias and myalgias.    Patient Active Problem List   Diagnosis Date Noted   BMI  34.0-34.9,adult 03/05/2023   Type II diabetes mellitus with complication 03/05/2023   Current moderate episode of major depressive disorder without prior episode 03/05/2023   PMR (polymyalgia rheumatica) 02/26/2023   ESR raised 12/07/2022   Leukocytosis 08/08/2022   Status post hysterectomy 03/05/2022   Essential hypertension 04/16/2021   Hepatic steatosis 03/19/2021   Carpal tunnel syndrome of right wrist 07/10/2020   Screening for colon cancer    Coronary artery disease of native artery of native heart with stable angina pectoris 01/18/2019   Major depression single episode, in partial remission 01/18/2019   Neutrophilic leukocytosis 08/09/2018   Pulmonary nodule less than 6 cm determined by computed tomography of lung 07/30/2018   Night sweats 07/16/2018   Neck muscle spasm 07/15/2018   Mixed hyperlipidemia 07/15/2018   Tobacco use disorder, moderate, in sustained remission 07/15/2018   Migraine without aura and without status migrainosus, not intractable 06/24/2018   Gastroesophageal reflux disease without esophagitis 06/24/2018   Chronic midline low back pain without sciatica 06/24/2018    Allergies  Allergen Reactions   Oxycodone Other (See Comments)    "Black outs" and confusion   Dye Fdc Red [Red Dye] Other (See Comments)    Severe migraine    Past Surgical History:  Procedure Laterality Date   ABDOMINAL HYSTERECTOMY  2012   partial for bleeding  BACK SURGERY     BREAST BIOPSY Right    neg   BREAST BIOPSY Left 2015   4 bxs- neg   BREAST SURGERY Left    multiple biopsies   CARDIAC CATHETERIZATION     CATARACT EXTRACTION Right 12/19/2010   CHOLECYSTECTOMY  1999   COLONOSCOPY WITH PROPOFOL N/A 03/31/2020   Procedure: COLONOSCOPY WITH PROPOFOL;  Surgeon: Midge MiniumWohl, Darren, MD;  Location: [facility] ENDOSCOPY;  Service: Endoscopy;  Laterality: N/A;   CORONARY PRESSURE/FFR STUDY N/A 07/22/2018   Procedure: INTRAVASCULAR PRESSURE WIRE/FFR STUDY;  Surgeon: Yvonne KendallEnd, Christopher, MD;   Location: [facility] INVASIVE CV [facility];  Service: Cardiovascular;  Laterality: N/A;   EYE SURGERY     LEFT HEART CATH AND CORONARY ANGIOGRAPHY N/A 07/22/2018   Procedure: LEFT HEART CATH AND CORONARY ANGIOGRAPHY;  Surgeon: Yvonne KendallEnd, Christopher, MD;  Location: [facility] INVASIVE CV [facility];  Service: Cardiovascular;  Laterality: N/A;   POSTERIOR LUMBAR FUSION 4 LEVEL  2000    Social History   Tobacco Use   Smoking status: Former    Packs/day: 0.25    Years: 40.00    Additional pack years: 0.00    Total pack years: 10.00    Types: Cigarettes    Quit date: 09/18/2019    Years since quitting: 3.5   Smokeless tobacco: Never  Vaping Use   Vaping Use: Never used  Substance Use Topics   Alcohol use: Yes    Comment: less than once a week.   Drug use: Never     Medication list has been reviewed and updated.  Current Meds  Medication Sig   albuterol (VENTOLIN HFA) 108 (90 Base) MCG/ACT inhaler INHALE 2 PUFFS INTO THE LUNGS EVERY 4 HOURS AS NEEDED FOR WHEEZE OR FOR SHORTNESS OF BREATH   aspirin EC 81 MG tablet Take 81 mg by mouth daily.   baclofen (LIORESAL) 10 MG tablet Take 1 tablet (10 mg total) by mouth 3 (three) times daily.   buPROPion (WELLBUTRIN XL) 300 MG 24 hr tablet Take 1 tablet (300 mg total) by mouth daily.   cariprazine (VRAYLAR) 1.5 MG capsule Take 1 capsule (1.5 mg total) by mouth daily.   clonazePAM (KLONOPIN) 0.25 MG disintegrating tablet Take 1 tablet (0.25 mg total) by mouth 2 (two) times daily as needed.   clotrimazole-betamethasone (LOTRISONE) cream APPLY 1 APPLICATION TOPICALLY 2 (TWO) TIMES DAILY. TO RASH ON LEGS   cyclobenzaprine (FLEXERIL) 10 MG tablet TAKE 1 TABLET BY MOUTH THREE TIMES A DAY AS NEEDED FOR MUSCLE SPASM   escitalopram (LEXAPRO) 20 MG tablet TAKE 1 TABLET BY MOUTH EVERY DAY   Esomeprazole Magnesium (NEXIUM PO) Take 20 mg by mouth daily.    fenofibrate (TRICOR) 145 MG tablet Take 1 tablet (145 mg total) by mouth daily.   furosemide (LASIX) 20 MG tablet TAKE 1 TABLET BY  MOUTH EVERY DAY   hydrochlorothiazide (HYDRODIURIL) 25 MG tablet Take 1 tablet (25 mg total) by mouth daily.   ibuprofen (ADVIL) 600 MG tablet Take 1 tablet (600 mg total) by mouth every 6 (six) hours as needed.   KLOR-CON M10 10 MEQ tablet TAKE 1 TABLET BY MOUTH EVERY DAY   Lidocaine 4 % PTCH Apply 1 patch topically daily as needed (pain).    metFORMIN (GLUCOPHAGE) 500 MG tablet TAKE 1 TABLET BY MOUTH 2 TIMES DAILY WITH A MEAL.   Multiple Vitamin (MULTIVITAMIN) capsule Take 1 capsule by mouth daily.   ondansetron (ZOFRAN-ODT) 4 MG disintegrating tablet Take 1 tablet (4 mg total) by mouth every 8 (eight)  hours as needed for nausea or vomiting.   predniSONE (DELTASONE) 10 MG tablet Take 5 tablets (50 mg total) by mouth daily with breakfast.   rosuvastatin (CRESTOR) 40 MG tablet Take 1 tablet (40 mg total) by mouth daily.   SUMAtriptan (IMITREX) 100 MG tablet TAKE 1 TABLET (100 MG TOTAL) BY MOUTH AS NEEDED FOR MIGRAINE. MAY REPEAT IN 2 HOURS IF NEEDED   triamcinolone (KENALOG) 0.025 % ointment Apply 1 application topically as needed. For rash       03/21/2023   10:36 AM 03/05/2023    1:50 PM 02/17/2023    2:00 PM 01/17/2023    8:08 AM  GAD 7 : Generalized Anxiety Score  Nervous, Anxious, on Edge 3 1 0 3  Control/stop worrying 3 2 0 3  Worry too much - different things 0 2 0 3  Trouble relaxing 2 2 3 3   Restless 0 1 3 3   Easily annoyed or irritable 3 3 3 3   Afraid - awful might happen 0 1 0 3  Total GAD 7 Score 11 12 9 21   Anxiety Difficulty Somewhat difficult Somewhat difficult Extremely difficult Extremely difficult       03/21/2023   10:36 AM 03/05/2023    1:50 PM 02/17/2023    1:59 PM  Depression screen PHQ 2/9  Decreased Interest 1 1 2   Down, Depressed, Hopeless 2 2 3   PHQ - 2 Score 3 3 5   Altered sleeping 2 1 3   Tired, decreased energy 2 2 3   Change in appetite 3 3 3   Feeling bad or failure about yourself  1 2 3   Trouble concentrating 0 0 0  Moving slowly or fidgety/restless 0 0  0  Suicidal thoughts 0 0 0  PHQ-9 Score 11 11 17   Difficult doing work/chores Not difficult at all Not difficult at all Very difficult    BP Readings from Last 3 Encounters:  03/21/23 126/74  03/05/23 132/74  02/17/23 124/78    Physical Exam Constitutional:      General: She is in acute distress.  Cardiovascular:     Rate and Rhythm: Normal rate.  Pulmonary:     Effort: Pulmonary effort is normal.  Abdominal:     General: Bowel sounds are normal.     Hernia: A hernia is present. Hernia is present in the ventral area.       Comments: Tender, non reducible due to pain  Neurological:     Mental Status: She is alert.     Wt Readings from Last 3 Encounters:  03/21/23 190 lb (86.2 kg)  03/05/23 195 lb (88.5 kg)  02/17/23 200 lb (90.7 kg)    BP 126/74   Pulse 63   Ht 5\' 3"  (1.6 m)   Wt 190 lb (86.2 kg)   SpO2 97%   BMI 33.66 kg/m   Assessment and Plan:  Problem List Items Addressed This Visit   None Visit Diagnoses     Ventral hernia without obstruction or gangrene    -  Primary   avoid lifting > 5 lb, coughing, vomiting  refer to Gen Surgery urgently go to ED if pain worsens/n/v/fever   Relevant Orders   Ambulatory referral to General Surgery       No follow-ups on file.   Partially dictated using Dragon software, any errors are not intentional.  Reubin MilanLaura H. Charlesa Ehle, MD [facility] [place], [place]

## 2023-03-22 ENCOUNTER — Other Ambulatory Visit: Payer: Self-pay | Admitting: Internal Medicine

## 2023-03-22 DIAGNOSIS — G8929 Other chronic pain: Secondary | ICD-10-CM

## 2023-03-22 DIAGNOSIS — F324 Major depressive disorder, single episode, in partial remission: Secondary | ICD-10-CM

## 2023-03-24 ENCOUNTER — Ambulatory Visit (INDEPENDENT_AMBULATORY_CARE_PROVIDER_SITE_OTHER): Payer: 59 | Admitting: Surgery

## 2023-03-24 ENCOUNTER — Telehealth: Payer: Self-pay | Admitting: Surgery

## 2023-03-24 ENCOUNTER — Encounter: Payer: Self-pay | Admitting: Surgery

## 2023-03-24 VITALS — BP 128/82 | HR 81 | Temp 98.7°F | Ht 63.0 in | Wt 191.0 lb

## 2023-03-24 DIAGNOSIS — K436 Other and unspecified ventral hernia with obstruction, without gangrene: Secondary | ICD-10-CM

## 2023-03-24 NOTE — Patient Instructions (Addendum)
We have seen you today in our office for your Ventral (Abdominal) Hernia.  Stop taking your Aspirin now prior to surgery.   You have requested for your Ventral Hernia be repaired. This will be scheduled with Dr. Everlene Farrier at [facility].   Please see your (blue)pre-care sheet for information. Our surgery scheduler will call you to verify surgery date and to go over information.   You will need to arrange to be off work for 1-2 weeks but will have to have a lifting restriction of no more than 15 lbs for 6 weeks following your surgery. If you have FMLA or disability paperwork that needs filled out you may drop this off at our office or this can be faxed to [phone].   Ventral Hernia A ventral hernia is a bulge of tissue from inside the abdomen that pushes through a weak area of the muscles that form the front wall of the abdomen. The tissues inside the abdomen are inside a sac (peritoneum). These tissues include the small intestine, large intestine, and the fatty tissue that covers the intestines (omentum). Sometimes, the bulge that forms a hernia contains intestines. Other hernias contain only fat. Ventral hernias do not go away without surgical treatment. There are several types of ventral hernias. You may have: A hernia at an incision site from previous abdominal surgery (incisional hernia). A hernia just above the belly button (epigastric hernia), or at the belly button (umbilical hernia). These types of hernias can develop from heavy lifting or straining. A hernia that comes and goes (reducible hernia). It may be visible only when you lift or strain. This type of hernia can be pushed back into the abdomen (reduced). A hernia that traps abdominal tissue inside the hernia (incarcerated hernia). This type of hernia does not reduce. A hernia that cuts off blood flow to the tissues inside the hernia (strangulated hernia). The tissues can start to die if this happens. This is a  very painful bulge that cannot be reduced. A strangulated hernia is a medical emergency.  What are the causes? This condition is caused by abdominal tissue putting pressure on an area of weakness in the abdominal muscles. What increases the risk? The following factors may make you more likely to develop this condition: Being female. Being 49 or older. Being overweight or obese. Having had previous abdominal surgery, especially if there was an infection after surgery. Having had an injury to the abdominal wall. Having had several pregnancies. Having a buildup of fluid inside the abdomen (ascites).  What are the signs or symptoms? The only symptom of a ventral hernia may be a painless bulge in the abdomen. A reducible hernia may be visible only when you strain, cough, or lift. Other symptoms may include: Dull pain. A feeling of pressure.  Signs and symptoms of a strangulated hernia may include: Increasing pain. Nausea and vomiting. Pain when pressing on the hernia. The skin over the hernia turning red or purple. Constipation. Blood in the stool (feces).  How is this diagnosed? This condition may be diagnosed based on: Your symptoms. Your medical history. A physical exam. You may be asked to cough or strain while standing. These actions increase the pressure inside your abdomen and force the hernia through the opening in your muscles. Your health care provider may try to reduce the hernia by pressing on it. Imaging studies, such as an ultrasound or CT scan.  How is this treated? This condition is treated with surgery. If you  have a strangulated hernia, surgery is done as soon as possible. If your hernia is small and not incarcerated, you may be asked to lose some weight before surgery. Follow these instructions at home: Follow instructions from your health care provider about eating or drinking restrictions. If you are overweight, your health care provider may recommend that you  increase your activity level and eat a healthier diet. Do not lift anything that is heavier than 10 lb (4.5 kg). Return to your normal activities as told by your health care provider. Ask your health care provider what activities are safe for you. You may need to avoid activities that increase pressure on your hernia. Take over-the-counter and prescription medicines only as told by your health care provider. Keep all follow-up visits as told by your health care provider. This is important. Contact a health care provider if: Your hernia gets larger. Your hernia becomes painful. Get help right away if: Your hernia becomes increasingly painful. You have pain along with any of the following: Changes in skin color in the area of the hernia. Nausea. Vomiting. Fever. Summary A ventral hernia is a bulge of tissue from inside the abdomen that pushes through a weak area of the muscles that form the front wall of the abdomen. This condition is treated with surgery, which may be urgent depending on your hernia. Do not lift anything that is heavier than 10 lb (4.5 kg), and follow activity instructions from your health care provider. This information is not intended to replace advice given to you by your health care provider. Make sure you discuss any questions you have with your health care provider. Document Released: 11/18/2012 Document Revised: 07/19/2016 Document Reviewed: 07/19/2016 Elsevier Interactive Patient Education  [employer].

## 2023-03-24 NOTE — H&P (View-Only) (Signed)
Outpatient Surgical Follow Up  03/24/2023  Nancy Skinner is an 57 y.o. female.   Chief Complaint  Patient presents with   New Patient (Initial Visit)    Ventral hernia    HPI: Nancy Skinner is a 57 year old female seen in consultation for ventral hernia.  She reports that she started having significant abdominal pain that is sharp and intermittent worsening with Valsalva movements.  Started 10 days  ago or so, has been taking over-the-counter ibuprofen with some help.  She also endorses nausea vomiting and diarrhea.  No fevers or chills. She Did have laparoscopic cholecystectomy in [place] over 2 decades ago..  Had an abdominal hysterectomy 12 years ago. She Did I have cath 5 years ago without any major significant issues.  Did have a prior ultrasound last year that have personally reviewed showing prior cholecystectomy without any acute intra-abdominal abnormalities. She is able to perform more than 4 METS of activity without any shortness of breath or chest pain.  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  Globin A1c is 6.6. Quit smoking 2 to 3 years ago.  Past Medical History:  Diagnosis Date   Coronary artery disease, non-occlusive    a. LHC 07/22/18: LM nl, mLAD 60% w/ FFR 0.83, LCx nl, RCA nl, EF 55-65%, nl LVEDP, no AS.   Depression    Lumbar disc disease    Migraines    Mixed hyperlipidemia     Past Surgical History:  Procedure Laterality Date   ABDOMINAL HYSTERECTOMY  2012   partial for bleeding   BACK SURGERY     BREAST BIOPSY Right    neg   BREAST BIOPSY Left 2015   4 bxs- neg   BREAST SURGERY Left    multiple biopsies   CARDIAC CATHETERIZATION     CATARACT EXTRACTION Right 12/19/2010   CHOLECYSTECTOMY  1999   COLONOSCOPY WITH PROPOFOL N/A 03/31/2020   Procedure: COLONOSCOPY WITH PROPOFOL;  Surgeon: Midge Minium, MD;  Location: [facility] ENDOSCOPY;  Service: Endoscopy;  Laterality: N/A;   CORONARY PRESSURE/FFR STUDY N/A  07/22/2018   Procedure: INTRAVASCULAR PRESSURE WIRE/FFR STUDY;  Surgeon: Yvonne Kendall, MD;  Location: [facility] INVASIVE CV [facility];  Service: Cardiovascular;  Laterality: N/A;   EYE SURGERY     LEFT HEART CATH AND CORONARY ANGIOGRAPHY N/A 07/22/2018   Procedure: LEFT HEART CATH AND CORONARY ANGIOGRAPHY;  Surgeon: Yvonne Kendall, MD;  Location: [facility] INVAS[facility] CV [facility];  Service: Cardiovascular;  Laterality: N/A;   POSTERIOR LUMBAR FUSION 4 LEVEL  2000    Family History  Problem Relation Age of Onset   Breast cancer Mother 64   Heart attack Father 47   Alcohol abuse Father    Breast cancer Maternal Grandmother    Breast cancer Paternal Grandmother     Social History:  reports that she quit smoking about 3 years ago. Her smoking use included cigarettes. She has a 10.00 pack-year smoking history. She has been exposed to tobacco smoke. She has never used smokeless tobacco. She reports that she does not currently use alcohol. She reports that she does not use drugs.  Allergies:  Allergies  Allergen Reactions   Oxycodone Other (See Comments)    "Black outs" and confusion   Dye Fdc Red [Red Dye] Other (See Comments)    Severe migraine    Medications reviewed.    ROS Full ROS performed and is otherwise negative other than what is stated in HPI   BP 128/82   Pulse  81   Temp 98.7 F (37.1 C) (Oral)   Ht 5' 3" (1.6 m)   Wt 191 lb (86.6 kg)   SpO2 100%   BMI 33.83 kg/m   Physical Exam Vitals and nursing note reviewed. Exam conducted with a chaperone present.  Constitutional:      General: She is not in acute distress.    Appearance: Normal appearance. She is not ill-appearing or diaphoretic.  Eyes:     General:        Right eye: No discharge.        Left eye: No discharge.  Cardiovascular:     Rate and Rhythm: Normal rate and regular rhythm.     Heart sounds: No murmur heard.    No friction rub.  Pulmonary:     Effort: Pulmonary effort is normal.     Breath sounds: Normal  breath sounds. No stridor. No wheezing.  Abdominal:     Palpations: Abdomen is soft.     Tenderness: There is abdominal tenderness. There is no right CVA tenderness, left CVA tenderness, guarding or rebound.     Hernia: A hernia is present.     Comments: 3cm epigastric incarcerated ventral hernia, no peritonitis  Musculoskeletal:        General: Normal range of motion.     Cervical back: Normal range of motion and neck supple. No rigidity or tenderness.  Lymphadenopathy:     Cervical: No cervical adenopathy.  Skin:    General: Skin is warm and dry.     Capillary Refill: Capillary refill takes less than 2 seconds.     Coloration: Skin is not jaundiced.  Neurological:     General: No focal deficit present.     Mental Status: She is alert and oriented to person, place, and time.  Psychiatric:        Mood and Affect: Mood normal.        Behavior: Behavior normal.        Thought Content: Thought content normal.        Judgment: Judgment normal.      Assessment/Plan: This 60-year-old female with an incarcerated ventral hernia.  SHe is currently nontoxic nor peritonitic.  She is tender.  I do think that she does have an incarceration of omentum and falciform ligament and there is no evidence of bowel compromise.  I do however recommend prompt repair.  I do think that segment is enough that we can do an repair without major pain.  Procedure discussed with the patient in detail.  Risk, benefits and possible complications including but not limited to: Bleeding, infection, bowel injuries, recurrences, chronic pain.  She understands and she wishes to proceed.  Copy this report was sent to the referring provider. I spent 55 minutes in this encounter including personally reviewing medical records, imaging studies, coordinating her care, lacing orders and performing appropriate documentation     Dorella Laster, MD FACS General Surgeon 

## 2023-03-24 NOTE — Progress Notes (Signed)
Enter in error PA-S

## 2023-03-24 NOTE — Progress Notes (Signed)
Outpatient Surgical Follow Up  03/24/2023  Nancy Skinner is an 57 y.o. female.   Chief Complaint  Patient presents with   New Patient (Initial Visit)    Ventral hernia    HPI: Nancy Skinner is a 57 year old female seen in consultation for ventral hernia.  She reports that she started having significant abdominal pain that is sharp and intermittent worsening with Valsalva movements.  Started 10 days  ago or so, has been taking over-the-counter ibuprofen with some help.  She also endorses nausea vomiting and diarrhea.  No fevers or chills. She Did have laparoscopic cholecystectomy in [place] over 2 decades ago..  Had an abdominal hysterectomy 12 years ago. She Did I have cath 5 years ago without any major significant issues.  Did have a prior ultrasound last year that have personally reviewed showing prior cholecystectomy without any acute intra-abdominal abnormalities. She is able to perform more than 4 METS of activity without any shortness of breath or chest pain.  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  Globin A1c is 6.6. Quit smoking 2 to 3 years ago.  Past Medical History:  Diagnosis Date   Coronary artery disease, non-occlusive    a. LHC 07/22/18: LM nl, mLAD 60% w/ FFR 0.83, LCx nl, RCA nl, EF 55-65%, nl LVEDP, no AS.   Depression    Lumbar disc disease    Migraines    Mixed hyperlipidemia     Past Surgical History:  Procedure Laterality Date   ABDOMINAL HYSTERECTOMY  2012   partial for bleeding   BACK SURGERY     BREAST BIOPSY Right    neg   BREAST BIOPSY Left 2015   4 bxs- neg   BREAST SURGERY Left    multiple biopsies   CARDIAC CATHETERIZATION     CATARACT EXTRACTION Right 12/19/2010   CHOLECYSTECTOMY  1999   COLONOSCOPY WITH PROPOFOL N/A 03/31/2020   Procedure: COLONOSCOPY WITH PROPOFOL;  Surgeon: Midge Minium, MD;  Location: [facility] ENDOSCOPY;  Service: Endoscopy;  Laterality: N/A;   CORONARY PRESSURE/FFR STUDY N/A  07/22/2018   Procedure: INTRAVASCULAR PRESSURE WIRE/FFR STUDY;  Surgeon: Yvonne Kendall, MD;  Location: [facility] INVASIVE CV LAB;  Service: Cardiovascular;  Laterality: N/A;   EYE SURGERY     LEFT HEART CATH AND CORONARY ANGIOGRAPHY N/A 07/22/2018   Procedure: LEFT HEART CATH AND CORONARY ANGIOGRAPHY;  Surgeon: Yvonne Kendall, MD;  Location: [facility] INVASIVE CV LAB;  Service: Cardiovascular;  Laterality: N/A;   POSTERIOR LUMBAR FUSION 4 LEVEL  2000    Family History  Problem Relation Age of Onset   Breast cancer Mother 64   Heart attack Father 47   Alcohol abuse Father    Breast cancer Maternal Grandmother    Breast cancer Paternal Grandmother     Social History:  reports that she quit smoking about 3 years ago. Her smoking use included cigarettes. She has a 10.00 pack-year smoking history. She has been exposed to tobacco smoke. She has never used smokeless tobacco. She reports that she does not currently use alcohol. She reports that she does not use drugs.  Allergies:  Allergies  Allergen Reactions   Oxycodone Other (See Comments)    "Black outs" and confusion   Dye Fdc Red [Red Dye] Other (See Comments)    Severe migraine    Medications reviewed.    ROS Full ROS performed and is otherwise negative other than what is stated in HPI   BP 128/82   Pulse  81   Temp 98.7 F (37.1 C) (Oral)   Ht 5\' 3"  (1.6 m)   Wt 191 lb (86.6 kg)   SpO2 100%   BMI 33.83 kg/m   Physical Exam Vitals and nursing note reviewed. Exam conducted with a chaperone present.  Constitutional:      General: She is not in acute distress.    Appearance: Normal appearance. She is not ill-appearing or diaphoretic.  Eyes:     General:        Right eye: No discharge.        Left eye: No discharge.  Cardiovascular:     Rate and Rhythm: Normal rate and regular rhythm.     Heart sounds: No murmur heard.    No friction rub.  Pulmonary:     Effort: Pulmonary effort is normal.     Breath sounds: Normal  breath sounds. No stridor. No wheezing.  Abdominal:     Palpations: Abdomen is soft.     Tenderness: There is abdominal tenderness. There is no right CVA tenderness, left CVA tenderness, guarding or rebound.     Hernia: A hernia is present.     Comments: 3cm epigastric incarcerated ventral hernia, no peritonitis  Musculoskeletal:        General: Normal range of motion.     Cervical back: Normal range of motion and neck supple. No rigidity or tenderness.  Lymphadenopathy:     Cervical: No cervical adenopathy.  Skin:    General: Skin is warm and dry.     Capillary Refill: Capillary refill takes less than 2 seconds.     Coloration: Skin is not jaundiced.  Neurological:     General: No focal deficit present.     Mental Status: She is alert and oriented to person, place, and time.  Psychiatric:        Mood and Affect: Mood normal.        Behavior: Behavior normal.        Thought Content: Thought content normal.        Judgment: Judgment normal.      Assessment/Plan: This 57 year old female with an incarcerated ventral hernia.  SHe is currently nontoxic nor peritonitic.  She is tender.  I do think that she does have an incarceration of omentum and falciform ligament and there is no evidence of bowel compromise.  I do however recommend prompt repair.  I do think that segment is enough that we can do an repair without major pain.  Procedure discussed with the patient in detail.  Risk, benefits and possible complications including but not limited to: Bleeding, infection, bowel injuries, recurrences, chronic pain.  She understands and she wishes to proceed.  Copy this report was sent to the referring provider. I spent 55 minutes in this encounter including personally reviewing medical records, imaging studies, coordinating her care, lacing orders and performing appropriate documentation     Sterling Big, MD [facility] General Surgeon

## 2023-03-24 NOTE — Telephone Encounter (Signed)
Patient has been advised of Pre-Admission date/time, and Surgery date at [facility].  Surgery Date: 03/27/23 Preadmission Testing Date: 03/25/23 (phone 1p-4p)  Patient has been made aware to call [phone], between 1-3:00pm the day before surgery, to find out what time to arrive for surgery.

## 2023-03-25 ENCOUNTER — Other Ambulatory Visit: Payer: Self-pay | Admitting: Internal Medicine

## 2023-03-25 ENCOUNTER — Encounter
Admission: RE | Admit: 2023-03-25 | Discharge: 2023-03-25 | Disposition: A | Discharge status: Home / Self Care | Payer: 59 | Source: Ambulatory Visit | Attending: Surgery | Admitting: Surgery

## 2023-03-25 ENCOUNTER — Other Ambulatory Visit: Payer: Self-pay

## 2023-03-25 VITALS — Ht 63.0 in | Wt 191.0 lb

## 2023-03-25 DIAGNOSIS — M353 Polymyalgia rheumatica: Secondary | ICD-10-CM

## 2023-03-25 DIAGNOSIS — E118 Type 2 diabetes mellitus with unspecified complications: Secondary | ICD-10-CM

## 2023-03-25 DIAGNOSIS — I25118 Atherosclerotic heart disease of native coronary artery with other forms of angina pectoris: Secondary | ICD-10-CM

## 2023-03-25 DIAGNOSIS — I10 Essential (primary) hypertension: Secondary | ICD-10-CM

## 2023-03-25 DIAGNOSIS — E782 Mixed hyperlipidemia: Secondary | ICD-10-CM

## 2023-03-25 HISTORY — DX: Gastro-esophageal reflux disease without esophagitis: K21.9

## 2023-03-25 HISTORY — DX: Type 2 diabetes mellitus without complications: E11.9

## 2023-03-25 NOTE — Patient Instructions (Addendum)
Your procedure is scheduled on: Thursday 03/27/23 To find out your arrival time, please call [phone] between 1PM - 3PM on:   Wednesday 03/26/23 Report to the Registration Desk on the 1st floor of the Medical Mall. Valet parking is available.  If your arrival time is 6:00 am, do not arrive before that time as the Medical Mall entrance doors do not open until 6:00 am.  REMEMBER: Instructions that are not followed completely may result in serious medical risk, up to and including death; or upon the discretion of your surgeon and anesthesiologist your surgery may need to be rescheduled.  Do not eat food after midnight the night before surgery.  No gum chewing or hard candies.  You may however, drink CLEAR liquids up to 2 hours before you are scheduled to arrive for your surgery. Do not drink anything within 2 hours of your scheduled arrival time.  Type 1 and Type 2 diabetics should only drink water.  One week prior to surgery: Stop Anti-inflammatories (NSAIDS) such as Advil, Aleve, Ibuprofen, Motrin, Naproxen, Naprosyn and Aspirin based products such as Excedrin, Goody's Powder, BC Powder. You may however, continue to take Tylenol if needed for pain up until the day of surgery.  Stop ANY OVER THE COUNTER supplements until after surgery.  Continue taking all prescribed medications with the exception of the following:  **Follow guidelines for insulin and diabetes medications**  Stop Metformin today. Can restart after surgery.  TAKE ONLY THESE MEDICATIONS THE MORNING OF SURGERY WITH A SIP OF WATER:  buPROPion (WELLBUTRIN XL) 300 MG 24 hr tablet  escitalopram (LEXAPRO) 20 MG tablet  Esomeprazole Magnesium (NEXIUM PO) Antacid (take one the night before and one on the morning of surgery - helps to prevent nausea after surgery.) fenofibrate (TRICOR) 145 MG tablet  rosuvastatin (CRESTOR) 40 MG tablet  VRAYLAR 1.5 MG capsule  May take Clonazepam or muscle relaxer if needed.  Use inhalers  on the day of surgery and bring to the hospital.  No Alcohol for 24 hours before or after surgery.  No Smoking including e-cigarettes for 24 hours before surgery.  No chewable tobacco products for at least 6 hours before surgery.  No nicotine patches on the day of surgery.  Do not use any "recreational" drugs for at least a week (preferably 2 weeks) before your surgery.  Please be advised that the combination of cocaine and anesthesia may have negative outcomes, up to and including death. If you test positive for cocaine, your surgery will be cancelled.  On the morning of surgery brush your teeth with toothpaste and water, you may rinse your mouth with mouthwash if you wish. Do not swallow any toothpaste or mouthwash.  Use CHG Soap or wipes as directed on instruction sheet.  Do not wear lotions, powders, or perfumes. You can use deodorant.  Do not shave body hair from the neck down 48 hours before surgery.  Wear comfortable clothing (specific to your surgery type) to the hospital.  Do not wear jewelry, make-up, hairpins, clips or nail polish.  Contact lenses, hearing aids and dentures may not be worn into surgery.  Do not bring valuables to the hospital. [facility] is not responsible for any missing/lost belongings or valuables.   Notify your doctor if there is any change in your medical condition (cold, fever, infection).  If you are being discharged the day of surgery, you will not be allowed to drive home. You will need a responsible individual to drive you home and stay  with you for 24 hours after surgery.   If you are taking public transportation, you will need to have a responsible individual with you.  If you are being admitted to the hospital overnight, leave your suitcase in the car. After surgery it may be brought to your room.  In case of increased patient census, it may be necessary for you, the patient, to continue your postoperative care in the Same Day Surgery  department.  After surgery, you can help prevent lung complications by doing breathing exercises.  Take deep breaths and cough every 1-2 hours. Your doctor may order a device called an Incentive Spirometer to help you take deep breaths. When coughing or sneezing, hold a pillow firmly against your incision with both hands. This is called "splinting." Doing this helps protect your incision. It also decreases belly discomfort.  Surgery Visitation Policy:  Patients undergoing a surgery or procedure may have two family members or support persons with them as long as the person is not COVID-19 positive or experiencing its symptoms.   Inpatient Visitation:    Visiting hours are 7 a.m. to 8 p.m. Up to four visitors are allowed at one time in a patient room. The visitors may rotate out with other people during the day. One designated support person (adult) may remain overnight.  Please call the Pre-admissions Testing Dept. at [phone] if you have any questions about these instructions.     Preparing for Surgery with CHLORHEXIDINE GLUCONATE (CHG) Soap  Chlorhexidine Gluconate (CHG) Soap  o An antiseptic cleaner that kills germs and bonds with the skin to continue killing germs even after washing  o Used for showering the night before surgery and morning of surgery  Before surgery, you can play an important role by reducing the number of germs on your skin.  CHG (Chlorhexidine gluconate) soap is an antiseptic cleanser which kills germs and bonds with the skin to continue killing germs even after washing.  Please do not use if you have an allergy to CHG or antibacterial soaps. If your skin becomes reddened/irritated stop using the CHG.  1. Shower the NIGHT BEFORE SURGERY and the MORNING OF SURGERY with CHG soap.  2. If you choose to wash your hair, wash your hair first as usual with your normal shampoo.  3. After shampooing, rinse your hair and body thoroughly to remove the  shampoo.  4. Use CHG as you would any other liquid soap. You can apply CHG directly to the skin and wash gently with a scrungie or a clean washcloth.  5. Apply the CHG soap to your body only from the neck down. Do not use on open wounds or open sores. Avoid contact with your eyes, ears, mouth, and genitals (private parts). Wash face and genitals (private parts) with your normal soap.  6. Wash thoroughly, paying special attention to the area where your surgery will be performed.  7. Thoroughly rinse your body with warm water.  8. Do not shower/wash with your normal soap after using and rinsing off the CHG soap.  9. Pat yourself dry with a clean towel.  10. Wear clean pajamas to bed the night before surgery.  12. Place clean sheets on your bed the night of your first shower and do not sleep with pets.  13. Shower again with the CHG soap on the day of surgery prior to arriving at the hospital.  14. Do not apply any deodorants/lotions/powders.  15. Please wear clean clothes to the hospital.

## 2023-03-25 NOTE — Telephone Encounter (Signed)
Requested medication (s) are due for refill today - provider review   Requested medication (s) are on the active medication list -yes  Future visit scheduled -yes  Last refill: 02/26/23 #150   Notes to clinic: non delegated Rx  Requested Prescriptions  Pending Prescriptions Disp Refills   predniSONE (DELTASONE) 10 MG tablet [Pharmacy Med Name: PREDNISONE 10 MG TABLET] 150 tablet 0    Sig: Take 5 tablets (50 mg total) by mouth daily with breakfast.     Not Delegated - Endocrinology:  Oral Corticosteroids Failed - 03/25/2023  2:31 AM      Failed - This refill cannot be delegated      Failed - Manual Review: Eye exam for IOP if prolonged treatment      Failed - Glucose (serum) in normal range and within 180 days    Glucose  Date Value Ref Range Status  01/17/2023 110 (H) 70 - 99 mg/dL Final  77/37/3668 98  Final   Glucose, Bld  Date Value Ref Range Status  11/28/2022 158 (H) 70 - 99 mg/dL Final    Comment:    Glucose reference range applies only to samples taken after fasting for at least 8 hours.         Failed - Bone Mineral Density or Dexa Scan completed in the last 2 years      Passed - K in normal range and within 180 days    Potassium  Date Value Ref Range Status  01/17/2023 3.8 3.5 - 5.2 mmol/L Final         Passed - Na in normal range and within 180 days    Sodium  Date Value Ref Range Status  01/17/2023 138 134 - 144 mmol/L Final         Passed - Last BP in normal range    BP Readings from Last 1 Encounters:  03/24/23 128/82         Passed - Valid encounter within last 6 months    Recent Outpatient Visits           4 days ago Ventral hernia without obstruction or gangrene   [place] [facility] at [facility], Nyoka Cowden, MD   2 weeks ago Major depression single episode, in partial remission [facility])   [place] [facility] at [facility], Nyoka Cowden, MD   1 month ago Myalgia   [facility]  Primary Care & Sports Medicine at [facility], Nyoka Cowden, MD   2 months ago Annual physical exam   [facility] & [facility] at [facility], Nyoka Cowden, MD   2 months ago Edema, unspecified type   [facility] at [facility], Nyoka Cowden, MD       Future Appointments             Tomorrow Reubin Milan, MD [facility] Primary Care & Sports Medicine at [facility], [facility]   In 1 month Judithann Graves Nyoka Cowden, MD [facility] Primary Care & Sports Medicine at [facility], [facility]   In 10 months Reubin Milan, MD [facility] Sports Medicine at [facility], [facility]               Requested Prescriptions  Pending Prescriptions Disp Refills   predniSONE (DELTASONE) 10 MG tablet [Pharmacy Med Name: PREDNISONE 10 MG TABLET] 150 tablet 0    Sig:  Take 5 tablets (50 mg total) by mouth daily with breakfast.     Not Delegated - Endocrinology:  Oral Corticosteroids Failed - 03/25/2023  2:31 AM      Failed - This refill cannot be delegated      Failed - Manual Review: Eye exam for IOP if prolonged treatment      Failed - Glucose (serum) in normal range and within 180 days    Glucose  Date Value Ref Range Status  01/17/2023 110 (H) 70 - 99 mg/dL Final  02/54/2706 98  Final   Glucose, Bld  Date Value Ref Range Status  11/28/2022 158 (H) 70 - 99 mg/dL Final    Comment:    Glucose reference range applies only to samples taken after fasting for at least 8 hours.         Failed - Bone Mineral Density or Dexa Scan completed in the last 2 years      Passed - K in normal range and within 180 days    Potassium  Date Value Ref Range Status  01/17/2023 3.8 3.5 - 5.2 mmol/L Final         Passed - Na in normal range and within 180 days    Sodium  Date Value Ref Range Status  01/17/2023 138 134 - 144 mmol/L Final         Passed - Last BP in normal range    BP Readings from Last 1  Encounters:  03/24/23 128/82         Passed - Valid encounter within last 6 months    Recent Outpatient Visits           4 days ago Ventral hernia without obstruction or gangrene   [place] [facility] at [facility], Nyoka Cowden, MD   2 weeks ago Major depression single episode, in partial remission [facility])   [place] [facility] Medicine at [facility], Nyoka Cowden, MD   1 month ago Myalgia   [facility] Primary Care & Sports Medicine at [facility], Nyoka Cowden, MD   2 months ago Annual physical exam   [facility] Primary Care & Sports Medicine at [facility], Nyoka Cowden, MD   2 months ago Edema, unspecified type   [facility] Primary Care & Sports Medicine at [facility], Nyoka Cowden, MD       Future Appointments             Tomorrow Judithann Graves, Nyoka Cowden, MD [facility] Health Primary Care & Sports Medicine at [facility], [facility]   In 1 month Judithann Graves, Nyoka Cowden, MD [facility] Primary Care & Sports Medicine at [facility], [facility]   In 10 months Judithann Graves, Nyoka Cowden, MD [facility] Primary Care & Sports Medicine at [facility], [facility]

## 2023-03-26 ENCOUNTER — Encounter
Admission: RE | Admit: 2023-03-26 | Discharge: 2023-03-26 | Disposition: A | Discharge status: Home / Self Care | Payer: 59 | Source: Ambulatory Visit | Attending: Surgery | Admitting: Surgery

## 2023-03-26 ENCOUNTER — Ambulatory Visit: Payer: 59 | Admitting: Internal Medicine

## 2023-03-26 DIAGNOSIS — Z01810 Encounter for preprocedural cardiovascular examination: Secondary | ICD-10-CM | POA: Diagnosis present

## 2023-03-26 DIAGNOSIS — E782 Mixed hyperlipidemia: Secondary | ICD-10-CM | POA: Diagnosis not present

## 2023-03-26 DIAGNOSIS — I10 Essential (primary) hypertension: Secondary | ICD-10-CM | POA: Diagnosis not present

## 2023-03-26 DIAGNOSIS — I25118 Atherosclerotic heart disease of native coronary artery with other forms of angina pectoris: Secondary | ICD-10-CM | POA: Insufficient documentation

## 2023-03-26 DIAGNOSIS — E118 Type 2 diabetes mellitus with unspecified complications: Secondary | ICD-10-CM | POA: Diagnosis not present

## 2023-03-26 MED ORDER — SODIUM CHLORIDE 0.9 % IV SOLN: INTRAVENOUS | Status: DC

## 2023-03-26 MED ORDER — CEFAZOLIN SODIUM-DEXTROSE 2-4 GM/100ML-% IV SOLN
2.0000 g | INTRAVENOUS | Status: AC
  Administered 2023-03-27: 2 g via INTRAVENOUS

## 2023-03-26 MED ORDER — CELECOXIB 200 MG PO CAPS
200.0000 mg | ORAL_CAPSULE | ORAL | Status: AC
  Administered 2023-03-27: 200 mg via ORAL

## 2023-03-26 MED ORDER — CHLORHEXIDINE GLUCONATE CLOTH 2 % EX PADS
6.0000 | MEDICATED_PAD | Freq: Once | CUTANEOUS | Status: AC
  Administered 2023-03-27: 6 via TOPICAL

## 2023-03-26 MED ORDER — ACETAMINOPHEN 500 MG PO TABS
1000.0000 mg | ORAL_TABLET | ORAL | Status: AC
  Administered 2023-03-27: 1000 mg via ORAL

## 2023-03-26 MED ORDER — GABAPENTIN 300 MG PO CAPS
300.0000 mg | ORAL_CAPSULE | ORAL | Status: AC
  Administered 2023-03-27: 300 mg via ORAL

## 2023-03-27 ENCOUNTER — Encounter
Admission: RE | Disposition: A | Discharge status: Home / Self Care | Payer: Self-pay | Source: Home / Self Care | Attending: Surgery

## 2023-03-27 ENCOUNTER — Other Ambulatory Visit: Payer: Self-pay

## 2023-03-27 ENCOUNTER — Ambulatory Visit: Payer: 59 | Admitting: General Practice

## 2023-03-27 ENCOUNTER — Ambulatory Visit
Admission: RE | Admit: 2023-03-27 | Discharge: 2023-03-27 | Disposition: A | Discharge status: Home / Self Care | Payer: 59 | Attending: Surgery | Admitting: Surgery

## 2023-03-27 DIAGNOSIS — K436 Other and unspecified ventral hernia with obstruction, without gangrene: Secondary | ICD-10-CM | POA: Insufficient documentation

## 2023-03-27 DIAGNOSIS — Z87891 Personal history of nicotine dependence: Secondary | ICD-10-CM | POA: Insufficient documentation

## 2023-03-27 DIAGNOSIS — E119 Type 2 diabetes mellitus without complications: Secondary | ICD-10-CM | POA: Diagnosis not present

## 2023-03-27 DIAGNOSIS — E118 Type 2 diabetes mellitus with unspecified complications: Secondary | ICD-10-CM

## 2023-03-27 DIAGNOSIS — I10 Essential (primary) hypertension: Secondary | ICD-10-CM | POA: Diagnosis not present

## 2023-03-27 DIAGNOSIS — Z90710 Acquired absence of both cervix and uterus: Secondary | ICD-10-CM | POA: Insufficient documentation

## 2023-03-27 DIAGNOSIS — Z79899 Other long term (current) drug therapy: Secondary | ICD-10-CM | POA: Diagnosis not present

## 2023-03-27 DIAGNOSIS — Z9049 Acquired absence of other specified parts of digestive tract: Secondary | ICD-10-CM | POA: Insufficient documentation

## 2023-03-27 HISTORY — PX: INSERTION OF MESH: SHX5868

## 2023-03-27 HISTORY — PX: VENTRAL HERNIA REPAIR: SHX424

## 2023-03-27 LAB — GLUCOSE, CAPILLARY
Glucose-Capillary: 109 mg/dL — ABNORMAL HIGH (ref 70–99)
Glucose-Capillary: 132 mg/dL — ABNORMAL HIGH (ref 70–99)

## 2023-03-27 SURGERY — REPAIR, HERNIA, VENTRAL
Anesthesia: General

## 2023-03-27 MED ORDER — FENTANYL CITRATE (PF) 100 MCG/2ML IJ SOLN
INTRAMUSCULAR | Status: DC | PRN
  Administered 2023-03-27: 100 ug via INTRAVENOUS

## 2023-03-27 MED ORDER — ONDANSETRON HCL 4 MG/2ML IJ SOLN
INTRAMUSCULAR | Status: AC
  Filled 2023-03-27: qty 2

## 2023-03-27 MED ORDER — CEFAZOLIN SODIUM-DEXTROSE 2-4 GM/100ML-% IV SOLN
INTRAVENOUS | Status: AC
  Filled 2023-03-27: qty 100

## 2023-03-27 MED ORDER — HYDROCODONE-ACETAMINOPHEN 5-325 MG PO TABS
1.0000 | ORAL_TABLET | Freq: Four times a day (QID) | ORAL | Status: DC | PRN
  Administered 2023-03-27: 1 via ORAL

## 2023-03-27 MED ORDER — HYDROCODONE-ACETAMINOPHEN 5-325 MG PO TABS
ORAL_TABLET | ORAL | Status: AC
  Filled 2023-03-27: qty 1

## 2023-03-27 MED ORDER — GABAPENTIN 300 MG PO CAPS
ORAL_CAPSULE | ORAL | Status: AC
  Filled 2023-03-27: qty 1

## 2023-03-27 MED ORDER — BUPIVACAINE-EPINEPHRINE 0.25% -1:200000 IJ SOLN
INTRAMUSCULAR | Status: DC | PRN
  Administered 2023-03-27: 50 mL via INTRAMUSCULAR

## 2023-03-27 MED ORDER — MIDAZOLAM HCL 2 MG/2ML IJ SOLN
INTRAMUSCULAR | Status: DC | PRN
  Administered 2023-03-27: 2 mg via INTRAVENOUS

## 2023-03-27 MED ORDER — HYDROMORPHONE HCL 1 MG/ML IJ SOLN
INTRAMUSCULAR | Status: DC | PRN
  Administered 2023-03-27: 1 mg via INTRAVENOUS

## 2023-03-27 MED ORDER — CELECOXIB 200 MG PO CAPS
ORAL_CAPSULE | ORAL | Status: AC
  Filled 2023-03-27: qty 1

## 2023-03-27 MED ORDER — LACTATED RINGERS IV SOLN: INTRAVENOUS | Status: DC | PRN

## 2023-03-27 MED ORDER — FENTANYL CITRATE (PF) 100 MCG/2ML IJ SOLN
INTRAMUSCULAR | Status: AC
  Filled 2023-03-27: qty 2

## 2023-03-27 MED ORDER — SUGAMMADEX SODIUM 200 MG/2ML IV SOLN
INTRAVENOUS | Status: DC | PRN
  Administered 2023-03-27: 200 mg via INTRAVENOUS

## 2023-03-27 MED ORDER — ACETAMINOPHEN 500 MG PO TABS
ORAL_TABLET | ORAL | Status: AC
  Filled 2023-03-27: qty 2

## 2023-03-27 MED ORDER — LIDOCAINE HCL (CARDIAC) PF 100 MG/5ML IV SOSY
PREFILLED_SYRINGE | INTRAVENOUS | Status: DC | PRN
  Administered 2023-03-27: 100 mg via INTRAVENOUS

## 2023-03-27 MED ORDER — BUPIVACAINE HCL (PF) 0.25 % IJ SOLN
INTRAMUSCULAR | Status: AC
  Filled 2023-03-27: qty 30

## 2023-03-27 MED ORDER — HYDROMORPHONE HCL 1 MG/ML IJ SOLN
INTRAMUSCULAR | Status: AC
  Filled 2023-03-27: qty 1

## 2023-03-27 MED ORDER — ONDANSETRON HCL 4 MG/2ML IJ SOLN
INTRAMUSCULAR | Status: DC | PRN
  Administered 2023-03-27: 4 mg via INTRAVENOUS

## 2023-03-27 MED ORDER — MIDAZOLAM HCL 2 MG/2ML IJ SOLN
INTRAMUSCULAR | Status: AC
  Filled 2023-03-27: qty 2

## 2023-03-27 MED ORDER — DEXAMETHASONE SODIUM PHOSPHATE 10 MG/ML IJ SOLN
INTRAMUSCULAR | Status: DC | PRN
  Administered 2023-03-27: 10 mg via INTRAVENOUS

## 2023-03-27 MED ORDER — HYDROCODONE-ACETAMINOPHEN 5-325 MG PO TABS
1.0000 | ORAL_TABLET | Freq: Four times a day (QID) | ORAL | 0 refills | Status: DC | PRN
Start: 2023-03-27 — End: 2023-11-12

## 2023-03-27 MED ORDER — EPINEPHRINE PF 1 MG/ML IJ SOLN
INTRAMUSCULAR | Status: AC
  Filled 2023-03-27: qty 1

## 2023-03-27 MED ORDER — ROCURONIUM BROMIDE 100 MG/10ML IV SOLN
INTRAVENOUS | Status: DC | PRN
  Administered 2023-03-27: 50 mg via INTRAVENOUS

## 2023-03-27 MED ORDER — PROPOFOL 10 MG/ML IV BOLUS
INTRAVENOUS | Status: DC | PRN
  Administered 2023-03-27: 30 mg via INTRAVENOUS
  Administered 2023-03-27: 150 mg via INTRAVENOUS

## 2023-03-27 MED ORDER — CHLORHEXIDINE GLUCONATE 0.12 % MT SOLN
OROMUCOSAL | Status: AC
  Filled 2023-03-27: qty 15

## 2023-03-27 MED ORDER — ONDANSETRON HCL 4 MG/2ML IJ SOLN
4.0000 mg | Freq: Once | INTRAMUSCULAR | Status: AC | PRN
  Administered 2023-03-27: 4 mg via INTRAVENOUS

## 2023-03-27 MED ORDER — BUPIVACAINE LIPOSOME 1.3 % IJ SUSP
INTRAMUSCULAR | Status: AC
  Filled 2023-03-27: qty 20

## 2023-03-27 MED ORDER — 0.9 % SODIUM CHLORIDE (POUR BTL) OPTIME
TOPICAL | Status: DC | PRN
  Administered 2023-03-27: 1000 mL

## 2023-03-27 MED ORDER — FENTANYL CITRATE (PF) 100 MCG/2ML IJ SOLN: 25.0000 ug | INTRAMUSCULAR | Status: DC | PRN

## 2023-03-27 MED ORDER — ACETAMINOPHEN 10 MG/ML IV SOLN: 1000.0000 mg | Freq: Once | INTRAVENOUS | Status: DC | PRN

## 2023-03-27 SURGICAL SUPPLY — 41 items
ADH SKN CLS APL DERMABOND .7 (GAUZE/BANDAGES/DRESSINGS) ×2
APL PRP STRL LF DISP 70% ISPRP (MISCELLANEOUS) ×2
APPLIER CLIP 11 MED OPEN (CLIP)
APPLIER CLIP 13 LRG OPEN (CLIP)
APR CLP LRG 13 20 CLIP (CLIP)
APR CLP MED 11 20 MLT OPN (CLIP)
BLADE CLIPPER SURG (BLADE) ×2 IMPLANT
CHLORAPREP W/TINT 26 (MISCELLANEOUS) ×2 IMPLANT
CLIP APPLIE 11 MED OPEN (CLIP) ×1 IMPLANT
CLIP APPLIE 13 LRG OPEN (CLIP) ×1 IMPLANT
DERMABOND ADVANCED .7 DNX12 (GAUZE/BANDAGES/DRESSINGS) ×1 IMPLANT
DRAPE LAPAROTOMY 100X77 ABD (DRAPES) ×2 IMPLANT
DRSG TELFA 3X8 NADH STRL (GAUZE/BANDAGES/DRESSINGS) ×2 IMPLANT
ELECT CAUTERY BLADE 6.4 (BLADE) ×2 IMPLANT
ELECT REM PT RETURN 9FT ADLT (ELECTROSURGICAL) ×2
ELECTRODE REM PT RTRN 9FT ADLT (ELECTROSURGICAL) ×2 IMPLANT
GAUZE 4X4 16PLY ~~LOC~~+RFID DBL (SPONGE) ×1 IMPLANT
GAUZE SPONGE 4X4 12PLY STRL (GAUZE/BANDAGES/DRESSINGS) ×2 IMPLANT
GLOVE BIO SURGEON STRL SZ7 (GLOVE) ×2 IMPLANT
GOWN STRL REUS W/ TWL LRG LVL3 (GOWN DISPOSABLE) ×4 IMPLANT
GOWN STRL REUS W/TWL LRG LVL3 (GOWN DISPOSABLE) ×4
MANIFOLD NEPTUNE II (INSTRUMENTS) ×2 IMPLANT
MESH VENTRALEX ST 2.5 CRC MED (Mesh General) ×1 IMPLANT
NDL HYPO 22X1.5 SAFETY MO (MISCELLANEOUS) ×1 IMPLANT
NDL HYPO 25X1 1.5 SAFETY (NEEDLE) ×1 IMPLANT
NEEDLE HYPO 22X1.5 SAFETY MO (MISCELLANEOUS) IMPLANT
NEEDLE HYPO 25X1 1.5 SAFETY (NEEDLE) ×2 IMPLANT
PACK BASIN MINOR ARMC (MISCELLANEOUS) ×2 IMPLANT
SPONGE T-LAP 18X18 ~~LOC~~+RFID (SPONGE) ×2 IMPLANT
STAPLER SKIN PROX 35W (STAPLE) ×1 IMPLANT
SUT ETHIBOND 0 MO6 C/R (SUTURE) ×3 IMPLANT
SUT MNCRL 4-0 (SUTURE) ×2
SUT MNCRL 4-0 27XMFL (SUTURE) ×2
SUT VIC AB 2-0 SH 27 (SUTURE) ×4
SUT VIC AB 2-0 SH 27XBRD (SUTURE) ×4 IMPLANT
SUTURE MNCRL 4-0 27XMF (SUTURE) ×1 IMPLANT
SYR 20ML LL LF (SYRINGE) ×2 IMPLANT
TAPE MICROFOAM 4IN (TAPE) ×2 IMPLANT
TRAP FLUID SMOKE EVACUATOR (MISCELLANEOUS) ×2 IMPLANT
WATER STERILE IRR 1000ML POUR (IV SOLUTION) ×2 IMPLANT
WATER STERILE IRR 500ML POUR (IV SOLUTION) ×2 IMPLANT

## 2023-03-27 NOTE — Anesthesia Postprocedure Evaluation (Signed)
Anesthesia Post Note  Patient: Nancy Skinner  Procedure(s) Performed: HERNIA REPAIR VENTRAL ADULT, open INSERTION OF MESH (Abdomen)  Patient location during evaluation: PACU Anesthesia Type: General Level of consciousness: awake and alert, oriented and patient cooperative Pain management: pain level controlled Vital Signs Assessment: post-procedure vital signs reviewed and stable Respiratory status: spontaneous breathing, nonlabored ventilation and respiratory function stable Cardiovascular status: blood pressure returned to baseline and stable Postop Assessment: adequate PO intake Anesthetic complications: no   There were no known notable events for this encounter.   Last Vitals:  Vitals:   03/27/23 1530 03/27/23 1544  BP: 131/77 (!) 140/81  Pulse: 75 87  Resp: 10 14  Temp: (!) 36.2 C (!) 36.1 C  SpO2: 95% 93%    Last Pain:  Vitals:   03/27/23 1544  TempSrc: Temporal  PainSc: 5                  Reed Breech

## 2023-03-27 NOTE — Interval H&P Note (Signed)
History and Physical Interval Note:  03/27/2023 12:17 PM  Nancy Skinner  has presented today for surgery, with the diagnosis of 3 cm incarcerated ventral hernia.  The various methods of treatment have been discussed with the patient and family. After consideration of risks, benefits and other options for treatment, the patient has consented to  Procedure(s): HERNIA REPAIR VENTRAL ADULT, open (N/A) as a surgical intervention.  The patient's history has been reviewed, patient examined, no change in status, stable for surgery.  I have reviewed the patient's chart and labs.  Questions were answered to the patient's satisfaction.     Nancy Skinner F Loney Peto

## 2023-03-27 NOTE — Anesthesia Procedure Notes (Signed)
Procedure Name: Intubation Date/Time: 03/27/2023 1:52 PM  Performed by: Elmarie Mainland, CRNAPre-anesthesia Checklist: Patient identified, Emergency Drugs available, Suction available and Patient being monitored Patient Re-evaluated:Patient Re-evaluated prior to induction Oxygen Delivery Method: Circle system utilized Preoxygenation: Pre-oxygenation with 100% oxygen Induction Type: IV induction Ventilation: Mask ventilation without difficulty Laryngoscope Size: McGraph and 3 Tube type: Oral Tube size: 6.5 mm Number of attempts: 1 Airway Equipment and Method: Stylet and Oral airway Placement Confirmation: ETT inserted through vocal cords under direct vision, positive ETCO2 and breath sounds checked- equal and bilateral Secured at: 19 cm Tube secured with: Tape Dental Injury: Teeth and Oropharynx as per pre-operative assessment

## 2023-03-27 NOTE — Transfer of Care (Signed)
Immediate Anesthesia Transfer of Care Note  Patient: Nancy Skinner  Procedure(s) Performed: HERNIA REPAIR VENTRAL ADULT, open INSERTION OF MESH (Abdomen)  Patient Location: PACU  Anesthesia Type:General  Level of Consciousness: awake, alert , and oriented  Airway & Oxygen Therapy: Patient Spontanous Breathing  Post-op Assessment: Report given to RN and Post -op Vital signs reviewed and stable  Post vital signs: stable  Last Vitals:  Vitals Value Taken Time  BP 117/107 03/27/23 1502  Temp    Pulse 84 03/27/23 1503  Resp 9 03/27/23 1503  SpO2 86 % 03/27/23 1503  Vitals shown include unvalidated device data.  Last Pain: There were no vitals filed for this visit.       Complications: No notable events documented.

## 2023-03-27 NOTE — Discharge Instructions (Addendum)
Open Hernia Repair, Adult, Care After What can I expect after the procedure? After the procedure, it is common to have: Mild discomfort. Slight bruising. Mild swelling. Pain in the belly (abdomen). A small amount of blood from the cut from surgery (incision). Follow these instructions at home: Your doctor may give you more specific instructions. If you have problems, call your doctor. Medicines Take over-the-counter and prescription medicines only as told by your doctor. If told, take steps to prevent problems with pooping (constipation). You may need to: Drink enough fluid to keep your pee (urine) pale yellow. Take medicines. You will be told what medicines to take. Eat foods that are high in fiber. These include beans, whole grains, and fresh fruits and vegetables. Limit foods that are high in fat and sugar. These include fried or sweet foods. Ask your doctor if you should avoid driving or using machines while you are taking your medicine. Incision care  Follow instructions from your doctor about how to take care of your incision. Make sure you: Wash your hands with soap and water for at least 20 seconds before and after you change your bandage (dressing). If you cannot use soap and water, use hand sanitizer. Change your bandage. Leave stitches or skin glue in place for at least 2 weeks. Leave tape strips alone unless you are told to take them off. You may trim the edges of the tape strips if they curl up. Check your incision every day for signs of infection. Check for: More redness, swelling, or pain. More fluid or blood. Warmth. Pus or a bad smell. Wear loose, soft clothing while your incision heals. Activity  Rest as told by your doctor. Do not lift anything that is heavier than 10 lb (4.5 kg), or the limit that you are told. Do not play contact sports until your doctor says that this is safe. If you were given a sedative during your procedure, do not drive or use machines  until your doctor says that it is safe. A sedative is a medicine that helps you relax. Return to your normal activities when your doctor says that it is safe. General instructions Do not take baths, swim, or use a hot tub. Ask your doctor about taking showers or sponge baths. Hold a pillow over your belly when you cough or sneeze. This helps with pain. Do not smoke or use any products that contain nicotine or tobacco. If you need help quitting, ask your doctor. Keep all follow-up visits. Contact a doctor if: You have any of these signs of infection in or around your incision: More redness, swelling, or pain. More fluid or blood. Warmth. Pus. A bad smell. You have a fever or chills. You have blood in your poop (stool). You have not pooped (had a bowel movement) in 2-3 days. Medicine does not help your pain. Get help right away if: You have chest pain, or you are short of breath. You feel faint or light-headed. You have very bad pain. You vomit and your pain is worse. You have pain, swelling, or redness in a leg. These symptoms may be an emergency. Get help right away. Call your local emergency services (911 in the [place] Do not wait to see if the symptoms will go away. Do not drive yourself to the hospital. Summary After this procedure, it is common to have mild discomfort, slight bruising, and mild swelling. Follow instructions from your doctor about how to take care of your cut from surgery (incision). Check every  day for signs of infection. Do not lift heavy objects or play contact sports until your doctor says it is safe. Return to your normal activities as told by your doctor. This information is not intended to replace advice given to you by your health care provider. Make sure you discuss any questions you have with your health care provider. Document Revised: 07/17/2020 Document Reviewed: 07/17/2020 Elsevier Patient Education  2023 [employer]   AMBULATORY SURGERY   DISCHARGE INSTRUCTIONS   The drugs that you were given will stay in your system until tomorrow so for the next 24 hours you should not:  Drive an automobile Make any legal decisions Drink any alcoholic beverage   You may resume regular meals tomorrow.  Today it is better to start with liquids and gradually work up to solid foods.  You may eat anything you prefer, but it is better to start with liquids, then soup and crackers, and gradually work up to solid foods.   Please notify your doctor immediately if you have any unusual bleeding, trouble breathing, redness and pain at the surgery site, drainage, fever, or pain not relieved by medication.    Your post-operative visit with Dr.                                       is: Date:                        Time:    Please call to schedule your post-operative visit.  Additional Instructions: PLEASE LEAVE GREEN/TEAL BRACELET ON FOR 4 DAYS

## 2023-03-27 NOTE — Op Note (Signed)
Incarcerated ventral Hernia Repair  Pre-operative Diagnosis: incarcerated ventral hernia 3 cms  Post-operative Diagnosis: same  Surgeon: Sterling Big, MD FACS  Anesthesia: Gen. with endotracheal tube  Findings: Incarcerated omentum 3 cms defect  Estimated Blood Loss: 5cc                Specimens: sac and part of omentum          Complications: none              Procedure Details  The patient was seen again in the Holding Room. The benefits, complications, treatment options, and expected outcomes were discussed with the patient. The risks of bleeding, infection, recurrence of symptoms, failure to resolve symptoms, bowel injury, mesh placement, mesh infection, any of which could require further surgery were reviewed with the patient. The likelihood of improving the patient's symptoms with return to their baseline status is good.  The patient and/or family concurred with the proposed plan, giving informed consent.  The patient was taken to Operating Room, identified as [name] and the procedure verified.  A Time Out was held and the above information confirmed.  Prior to the induction of general anesthesia, antibiotic prophylaxis was administered. VTE prophylaxis was in place. General endotracheal anesthesia was then administered and tolerated well. After the induction, the abdomen was prepped with Chloraprep and draped in the sterile fashion. The patient was positioned in the supine position.  Incision was created with a scalpel over the hernia defect. Electrocautery was used to dissect through subcutaneous tissue, the hernia sac was opened there was a piece of incarcerated omentum,  Hernia sac was excised along with incarcerated tissue. The hernia was measured and the mesh was selected. I selected 6.4 cm ventralex mesh.  The mesh was placed in an underlay fashion. 4 trans-fascial sutures were placed and used to anchor the mesh. The mesh lay really nicely against the abdominal wall.   Remainder of the fascial defect approximated with multiple 0 ethibond sutures  Incision was closed in a 2 layer fashion with 3-0 Vicryl and 4-0 Monocryl. Dermabond was used to coat the skin. Liposomal Marcaine was used to inject all the incision sites. Patient tolerated procedure well and there were no immediate complications. Needle and laparotomy counts were correct   Sterling Big, MD, FACS

## 2023-03-27 NOTE — Interval H&P Note (Signed)
History and Physical Interval Note:  03/27/2023 1:01 PM  Nancy Skinner  has presented today for surgery, with the diagnosis of 3 cm incarcerated ventral hernia.  The various methods of treatment have been discussed with the patient and family. After consideration of risks, benefits and other options for treatment, the patient has consented to  Procedure(s): HERNIA REPAIR VENTRAL ADULT, open (N/A) as a surgical intervention.  The patient's history has been reviewed, patient examined, no change in status, stable for surgery.  I have reviewed the patient's chart and labs.  Questions were answered to the patient's satisfaction.     Nancy Skinner F Dorina Ribaudo

## 2023-03-27 NOTE — Anesthesia Preprocedure Evaluation (Signed)
Anesthesia Evaluation  Patient identified by MRN, date of birth, ID band Patient awake    Reviewed: Allergy & Precautions, NPO status , Patient's Chart, lab work & pertinent test results  History of Anesthesia Complications Negative for: history of anesthetic complications  Airway Mallampati: II  TM Distance: >3 FB Neck ROM: Full    Dental no notable dental hx. (+) Teeth Intact   Pulmonary neg pulmonary ROS, neg sleep apnea, neg COPD, Patient abstained from smoking.Not current smoker, former smoker   Pulmonary exam normal breath sounds clear to auscultation       Cardiovascular Exercise Tolerance: Good METShypertension, Pt. on medications + angina  + CAD  (-) Past MI (-) dysrhythmias  Rhythm:Regular Rate:Normal - Systolic murmurs Non obstructive CAD   Neuro/Psych  Headaches PSYCHIATRIC DISORDERS  Depression       GI/Hepatic ,GERD  ,,(+)     (-) substance abuse    Endo/Other  diabetes    Renal/GU negative Renal ROS     Musculoskeletal   Abdominal  (+) + obese  Peds  Hematology   Anesthesia Other Findings Past Medical History: No date: Coronary artery disease, non-occlusive     Comment:  a. LHC 07/22/18: LM nl, mLAD 60% w/ FFR 0.83, LCx nl, RCA               nl, EF 55-65%, nl LVEDP, no AS. No date: Depression No date: Diabetes mellitus without complication No date: GERD (gastroesophageal reflux disease) No date: Lumbar disc disease No date: Migraines No date: Mixed hyperlipidemia  Reproductive/Obstetrics                              Anesthesia Physical Anesthesia Plan  ASA: 2  Anesthesia Plan: General   Post-op Pain Management: Celebrex PO (pre-op)*, Tylenol PO (pre-op)* and Gabapentin PO (pre-op)*   Induction: Intravenous  PONV Risk Score and Plan: 4 or greater and Ondansetron, Dexamethasone and Midazolam  Airway Management Planned: Oral ETT and Video Laryngoscope  Planned  Additional Equipment: None  Intra-op Plan:   Post-operative Plan: Extubation in OR  Informed Consent: I have reviewed the patients History and Physical, chart, labs and discussed the procedure including the risks, benefits and alternatives for the proposed anesthesia with the patient or authorized representative who has indicated his/her understanding and acceptance.     Dental advisory given  Plan Discussed with: CRNA and Surgeon  Anesthesia Plan Comments: (Discussed risks of anesthesia with patient, including PONV, sore throat, lip/dental/eye damage. Rare risks discussed as well, such as cardiorespiratory and neurological sequelae, and allergic reactions. Discussed the role of CRNA in patient's perioperative care. Patient understands.)         Anesthesia Quick Evaluation

## 2023-03-28 ENCOUNTER — Encounter: Payer: Self-pay | Admitting: Surgery

## 2023-03-31 LAB — SURGICAL PATHOLOGY

## 2023-04-01 ENCOUNTER — Encounter: Payer: Self-pay | Admitting: Internal Medicine

## 2023-04-02 ENCOUNTER — Other Ambulatory Visit
Admission: RE | Admit: 2023-04-02 | Discharge: 2023-04-02 | Disposition: A | Discharge status: Home / Self Care | Payer: 59 | Attending: Internal Medicine | Admitting: Internal Medicine

## 2023-04-02 ENCOUNTER — Encounter: Payer: Self-pay | Admitting: Internal Medicine

## 2023-04-02 ENCOUNTER — Ambulatory Visit (INDEPENDENT_AMBULATORY_CARE_PROVIDER_SITE_OTHER): Payer: 59 | Admitting: Internal Medicine

## 2023-04-02 VITALS — BP 128/72 | HR 76 | Ht 63.0 in | Wt 190.0 lb

## 2023-04-02 DIAGNOSIS — M353 Polymyalgia rheumatica: Secondary | ICD-10-CM

## 2023-04-02 DIAGNOSIS — E118 Type 2 diabetes mellitus with unspecified complications: Secondary | ICD-10-CM | POA: Diagnosis not present

## 2023-04-02 LAB — SEDIMENTATION RATE: Sed Rate: 17 mm/hr (ref 0–30)

## 2023-04-02 MED ORDER — OZEMPIC (0.25 OR 0.5 MG/DOSE) 2 MG/3ML ~~LOC~~ SOPN
0.2500 mg | PEN_INJECTOR | SUBCUTANEOUS | 0 refills | Status: DC
Start: 2023-04-02 — End: 2023-04-24

## 2023-04-02 NOTE — Assessment & Plan Note (Addendum)
Clinically stable without s/s of hypoglycemia. Doing well with stable weight. On Metformin 500 mg bid. Will add Ozempic 0.25 mg weekly for better control. Lab Results  Component Value Date   HGBA1C 6.6 (H) 01/17/2023

## 2023-04-02 NOTE — Progress Notes (Signed)
Date:  04/02/2023   Name:  Nancy Skinner   DOB:  1966-09-24   MRN:  [id number]   Chief Complaint: PMR (Doing much better on predisone.) and Obesity (Wants to discuss weight loss medication options from [facility]. )  Diabetes She presents for her follow-up diabetic visit. She has type 2 diabetes mellitus. Her disease course has been stable. Pertinent negatives for hypoglycemia include no dizziness, headaches or nervousness/anxiousness. Pertinent negatives for diabetes include no chest pain, no fatigue and no weakness. Current diabetic treatment includes oral agent (monotherapy). She is compliant with treatment all of the time. An ACE inhibitor/angiotensin II receptor blocker is not being taken.   PMR - doing well with no further bone or muscle pain.  Minimal back pain at times.  On 50 mg with hot flashes.  Lab Results  Component Value Date   NA 138 01/17/2023   K 3.8 01/17/2023   CO2 25 01/17/2023   GLUCOSE 110 (H) 01/17/2023   BUN 15 01/17/2023   CREATININE 0.81 01/17/2023   CALCIUM 9.4 01/17/2023   EGFR 85 01/17/2023   GFRNONAA >60 11/28/2022   Lab Results  Component Value Date   CHOL 227 (H) 01/17/2023   HDL 39 (L) 01/17/2023   LDLCALC 96 01/17/2023   LDLDIRECT 48 09/02/2019   TRIG 552 (HH) 01/17/2023   CHOLHDL 5.8 (H) 01/17/2023   Lab Results  Component Value Date   TSH 2.380 01/17/2023   Lab Results  Component Value Date   HGBA1C 6.6 (H) 01/17/2023   Lab Results  Component Value Date   WBC 9.5 01/17/2023   HGB 12.7 01/17/2023   HCT 37.8 01/17/2023   MCV 86 01/17/2023   PLT 341 01/17/2023   Lab Results  Component Value Date   ALT 34 02/17/2023   AST 35 02/17/2023   ALKPHOS 71 02/17/2023   BILITOT 0.1 (L) 02/17/2023   No results found for: "25OHVITD2", "25OHVITD3", "VD25OH"   Review of Systems  Constitutional:  Positive for diaphoresis. Negative for fatigue and unexpected weight change.  HENT:  Negative for nosebleeds.   Eyes:  Negative  for visual disturbance.  Respiratory:  Negative for cough, chest tightness, shortness of breath and wheezing.   Cardiovascular:  Negative for chest pain, palpitations and leg swelling.  Gastrointestinal:  Negative for abdominal pain, constipation and diarrhea.  Neurological:  Negative for dizziness, weakness, light-headedness and headaches.  Psychiatric/Behavioral:  Negative for dysphoric mood and sleep disturbance. The patient is not nervous/anxious.     Patient Active Problem List   Diagnosis Date Noted   BMI 34.0-34.9,adult 03/05/2023   Type II diabetes mellitus with complication 03/05/2023   Current moderate episode of major depressive disorder without prior episode 03/05/2023   PMR (polymyalgia rheumatica) 02/26/2023   ESR raised 12/07/2022   Leukocytosis 08/08/2022   Status post hysterectomy 03/05/2022   Essential hypertension 04/16/2021   Hepatic steatosis 03/19/2021   Carpal tunnel syndrome of right wrist 07/10/2020   Screening for colon cancer    Coronary artery disease of native artery of native heart with stable angina pectoris 01/18/2019   Major depression single episode, in partial remission 01/18/2019   Neutrophilic leukocytosis 08/09/2018   Pulmonary nodule less than 6 cm determined by computed tomography of lung 07/30/2018   Night sweats 07/16/2018   Neck muscle spasm 07/15/2018   Mixed hyperlipidemia 07/15/2018   Tobacco use disorder, moderate, in sustained remission 07/15/2018   Migraine without aura and without status migrainosus, not intractable 06/24/2018  Gastroesophageal reflux disease without esophagitis 06/24/2018   Chronic midline low back pain without sciatica 06/24/2018    Allergies  Allergen Reactions   Oxycodone Other (See Comments)    "Black outs" and confusion   Dye Fdc Red [Red Dye] Other (See Comments)    Severe migraine    Past Surgical History:  Procedure Laterality Date   ABDOMINAL HYSTERECTOMY  2012   partial for bleeding   BACK  SURGERY     BREAST BIOPSY Right    neg   BREAST BIOPSY Left 2015   4 bxs- neg   BREAST SURGERY Left    multiple biopsies   CARDIAC CATHETERIZATION     CATARACT EXTRACTION Right 12/19/2010   CATARACT EXTRACTION Left    CHOLECYSTECTOMY  1999   COLONOSCOPY WITH PROPOFOL N/A 03/31/2020   Procedure: COLONOSCOPY WITH PROPOFOL;  Surgeon: Midge Minium, MD;  Location: [facility] ENDOSCOPY;  Service: Endoscopy;  Laterality: N/A;   CORONARY PRESSURE/FFR STUDY N/A 07/22/2018   Procedure: INTRAVASCULAR PRESSURE WIRE/FFR STUDY;  Surgeon: Yvonne Kendall, MD;  Location: [facility];  Service: Cardiovascular;  Laterality: N/A;   EYE SURGERY     INSERTION OF MESH  03/27/2023   Procedure: INSERTION OF MESH;  Surgeon: Leafy Ro, MD;  Location: [facility] ORS;  Service: General;;   LEFT HEART CATH AND CORONARY ANGIOGRAPHY N/A 07/22/2018   Procedure: LEFT HEART CATH AND CORONARY ANGIOGRAPHY;  Surgeon: Yvonne Kendall, MD;  Location: [facility] CV [facility];  Service: Cardiovascular;  Laterality: N/A;   POSTERIOR LUMBAR FUSION 4 LEVEL  2000   l 4-5 fusion   VENTRAL HERNIA REPAIR N/A 03/27/2023   Procedure: HERNIA REPAIR VENTRAL ADULT, open;  Surgeon: Leafy Ro, MD;  Location: [facility] ORS;  Service: General;  Laterality: N/A;    Social History   Tobacco Use   Smoking status: Former    Packs/day: 0.25    Years: 40.00    Additional pack years: 0.00    Total pack years: 10.00    Types: Cigarettes    Quit date: 09/18/2019    Years since quitting: 3.5    Passive exposure: Past   Smokeless tobacco: Never  Vaping Use   Vaping Use: Never used  Substance Use Topics   Alcohol use: Not Currently    Comment: less than once a week.   Drug use: Never     Medication list has been reviewed and updated.  Current Meds  Medication Sig   albuterol (VENTOLIN HFA) 108 (90 Base) MCG/ACT inhaler INHALE 2 PUFFS INTO THE LUNGS EVERY 4 HOURS AS NEEDED FOR WHEEZE OR FOR SHORTNESS OF BREATH   aspirin EC 81 MG  tablet Take 81 mg by mouth daily.   baclofen (LIORESAL) 10 MG tablet Take 1 tablet (10 mg total) by mouth 3 (three) times daily. (Patient taking differently: Take 10 mg by mouth 3 (three) times daily as needed for muscle spasms.)   buPROPion (WELLBUTRIN XL) 300 MG 24 hr tablet Take 1 tablet (300 mg total) by mouth daily.   clonazePAM (KLONOPIN) 0.25 MG disintegrating tablet Take 1 tablet (0.25 mg total) by mouth 2 (two) times daily as needed.   clotrimazole-betamethasone (LOTRISONE) cream APPLY 1 APPLICATION TOPICALLY 2 (TWO) TIMES DAILY. TO RASH ON LEGS (Patient taking differently: Apply 1 application  topically 2 (two) times daily as needed. To rash on legs)   cyclobenzaprine (FLEXERIL) 10 MG tablet TAKE 1 TABLET BY MOUTH THREE TIMES A DAY AS NEEDED FOR MUSCLE SPASM   escitalopram (LEXAPRO)  20 MG tablet TAKE 1 TABLET BY MOUTH EVERY DAY   Esomeprazole Magnesium (NEXIUM PO) Take 20 mg by mouth daily.    fenofibrate (TRICOR) 145 MG tablet Take 1 tablet (145 mg total) by mouth daily.   furosemide (LASIX) 20 MG tablet TAKE 1 TABLET BY MOUTH EVERY DAY   hydrochlorothiazide (HYDRODIURIL) 25 MG tablet Take 1 tablet (25 mg total) by mouth daily.   HYDROcodone-acetaminophen (NORCO/VICODIN) 5-325 MG tablet Take 1-2 tablets by mouth every 6 (six) hours as needed for moderate pain.   ibuprofen (ADVIL) 600 MG tablet Take 1 tablet (600 mg total) by mouth every 6 (six) hours as needed.   KLOR-CON M10 10 MEQ tablet TAKE 1 TABLET BY MOUTH EVERY DAY   Lidocaine 4 % PTCH Apply 1 patch topically daily as needed (pain).    metFORMIN (GLUCOPHAGE) 500 MG tablet TAKE 1 TABLET BY MOUTH 2 TIMES DAILY WITH A MEAL.   Multiple Vitamin (MULTIVITAMIN) capsule Take 1 capsule by mouth daily.   ondansetron (ZOFRAN-ODT) 4 MG disintegrating tablet Take 1 tablet (4 mg total) by mouth every 8 (eight) hours as needed for nausea or vomiting.   predniSONE (DELTASONE) 10 MG tablet TAKE 5 TABLETS (50 MG TOTAL) BY MOUTH DAILY WITH  BREAKFAST.   rosuvastatin (CRESTOR) 40 MG tablet Take 1 tablet (40 mg total) by mouth daily.   Semaglutide,0.25 or 0.5MG /DOS, (OZEMPIC, 0.25 OR 0.5 MG/DOSE,) 2 MG/3ML SOPN Inject 0.25 mg into the skin once a week.   SUMAtriptan (IMITREX) 100 MG tablet TAKE 1 TABLET (100 MG TOTAL) BY MOUTH AS NEEDED FOR MIGRAINE. MAY REPEAT IN 2 HOURS IF NEEDED   triamcinolone (KENALOG) 0.025 % ointment Apply 1 application topically as needed. For rash   VRAYLAR 1.5 MG capsule TAKE 1 CAPSULE BY MOUTH DAILY.       03/21/2023   10:36 AM 03/05/2023    1:50 PM 02/17/2023    2:00 PM 01/17/2023    8:08 AM  GAD 7 : Generalized Anxiety Score  Nervous, Anxious, on Edge 3 1 0 3  Control/stop worrying 3 2 0 3  Worry too much - different things 0 2 0 3  Trouble relaxing Restless 0 Easily annoyed or irritable Afraid - awful might happen 0 1 0 3  Total GAD 7 Score Anxiety Difficulty Somewhat difficult Somewhat difficult Extremely difficult Extremely difficult       03/21/2023   10:36 AM 03/05/2023    1:50 PM 02/17/2023    1:59 PM  Depression screen PHQ 2/9  Decreased Interest Down, Depressed, Hopeless PHQ - 2 Score Altered sleeping Tired, decreased energy Change in appetite Feeling bad or failure about yourself  Trouble concentrating 0 0 0  Moving slowly or fidgety/restless 0 0 0  Suicidal thoughts 0 0 0  PHQ-9 Score Difficult doing work/chores Not difficult at all Not difficult at all Very difficult    BP Readings from Last 3 Encounters:  04/02/23 128/72  03/27/23 (!) 140/81  03/24/23 128/82    Physical Exam Vitals and nursing note reviewed.  Constitutional:      General: She is not in acute distress.    Appearance: Normal appearance. She is well-developed.  HENT:     Head:  Normocephalic and atraumatic.  Cardiovascular:     Rate and Rhythm: Normal rate and regular rhythm.  Pulmonary:     Effort:  Pulmonary effort is normal. No respiratory distress.     Breath sounds: No wheezing or rhonchi.  Abdominal:       Comments: Surgical site healing well.  Musculoskeletal:     Cervical back: Normal range of motion.     Right lower leg: No edema.     Left lower leg: No edema.  Skin:    General: Skin is warm and dry.     Findings: No rash.  Neurological:     Mental Status: She is alert and oriented to person, place, and time.  Psychiatric:        Mood and Affect: Mood normal.        Behavior: Behavior normal.     Wt Readings from Last 3 Encounters:  04/02/23 190 lb (86.2 kg)  03/25/23 191 lb (86.6 kg)  03/24/23 191 lb (86.6 kg)    BP 128/72   Pulse 76   Ht  (1.6 m)   Wt 190 lb (86.2 kg)   SpO2 98%   BMI 33.66 kg/m   Assessment and Plan:  Problem List Items Addressed This Visit       Endocrine   Type II diabetes mellitus with complication (Chronic)    Clinically stable without s/s of hypoglycemia. Doing well with stable weight. On Metformin 500 mg bid. Will add Ozempic 0.25 mg weekly for better control. Lab Results  Component Value Date   HGBA1C 6.6 (H) 01/17/2023        Relevant Medications   Semaglutide,0.25 or 0.5MG /DOS, (OZEMPIC, 0.25 OR 0.5 MG/DOSE,) 2 MG/3ML SOPN     Other   PMR (polymyalgia rheumatica) - Primary (Chronic)    Currently on 50 mg per day and doing better Hot flashes as a side effect In the interim has had hernia surgery but healing well. Will check Sed Rate and reduce dose to 40 mg per day if improved. Follow up in 6 weeks      Relevant Orders   Sedimentation rate    No follow-ups on file.   Partially dictated using Dragon software, any errors are not intentional.  Reubin Milan, MD [facility] [place], [place]

## 2023-04-02 NOTE — Assessment & Plan Note (Addendum)
Currently on 50 mg per day and doing better Hot flashes as a side effect In the interim has had hernia surgery but healing well. Will check Sed Rate and reduce dose to 40 mg per day if improved. Follow up in 6 weeks

## 2023-04-07 ENCOUNTER — Encounter: Payer: 59 | Admitting: Surgery

## 2023-04-24 ENCOUNTER — Other Ambulatory Visit: Payer: Self-pay | Admitting: Internal Medicine

## 2023-04-24 ENCOUNTER — Other Ambulatory Visit: Payer: Self-pay

## 2023-04-24 DIAGNOSIS — M353 Polymyalgia rheumatica: Secondary | ICD-10-CM

## 2023-04-24 DIAGNOSIS — E118 Type 2 diabetes mellitus with unspecified complications: Secondary | ICD-10-CM

## 2023-04-24 MED ORDER — OZEMPIC (0.25 OR 0.5 MG/DOSE) 2 MG/3ML ~~LOC~~ SOPN
0.5000 mg | PEN_INJECTOR | SUBCUTANEOUS | 0 refills | Status: DC
Start: 2023-04-24 — End: 2023-05-02

## 2023-04-24 NOTE — Telephone Encounter (Signed)
Requested medication (s) are due for refill today:Due 04/26/23  Requested medication (s) are on the active medication list: yes    Last refill: 03/27/23  #150  0 refills  Future visit scheduled yes  05/02/23  Notes to clinic:Not delegated, please review. Thank you  Requested Prescriptions  Pending Prescriptions Disp Refills   predniSONE (DELTASONE) 10 MG tablet [Pharmacy Med Name: PREDNISONE 10 MG TABLET] 150 tablet 0    Sig: TAKE 5 TABLETS (50 MG TOTAL) BY MOUTH DAILY WITH BREAKFAST.     Not Delegated - Endocrinology:  Oral Corticosteroids Failed - 04/24/2023  2:26 AM      Failed - This refill cannot be delegated      Failed - Manual Review: Eye exam for IOP if prolonged treatment      Failed - Glucose (serum) in normal range and within 180 days    Glucose  Date Value Ref Range Status  01/17/2023 110 (H) 70 - 99 mg/dL Final  16/09/9603 98  Final   Glucose, Bld  Date Value Ref Range Status  11/28/2022 158 (H) 70 - 99 mg/dL Final    Comment:    Glucose reference range applies only to samples taken after fasting for at least 8 hours.   Glucose-Capillary  Date Value Ref Range Status  03/27/2023 132 (H) 70 - 99 mg/dL Final    Comment:    Glucose reference range applies only to samples taken after fasting for at least 8 hours.         Failed - Bone Mineral Density or Dexa Scan completed in the last 2 years      Passed - K in normal range and within 180 days    Potassium  Date Value Ref Range Status  01/17/2023 3.8 3.5 - 5.2 mmol/L Final         Passed - Na in normal range and within 180 days    Sodium  Date Value Ref Range Status  01/17/2023 138 134 - 144 mmol/L Final         Passed - Last BP in normal range    BP Readings from Last 1 Encounters:  04/02/23 128/72         Passed - Valid encounter within last 6 months    Recent Outpatient Visits           3 weeks ago PMR (polymyalgia rheumatica) (HCC)   [place] [facility] at [facility], Nyoka Cowden, MD   1 month ago Ventral hernia without obstruction or gangrene   [place] [facility] at [facility], Nyoka Cowden, MD   1 month ago Major depression single episode, in partial remission [facility])   [place] [facility] at [facility], Nyoka Cowden, MD   2 months ago Myalgia   [facility] at [facility], Nyoka Cowden, MD   3 months ago Annual physical exam   [facility] at [facility], Nyoka Cowden, MD       Future Appointments             In 1 week Judithann Graves, Nyoka Cowden, MD [facility] Medicine at [facility], [facility]   In 4 weeks Judithann Graves, Nyoka Cowden, MD [facility] Medicine at [facility], [facility]   In 9 months Judithann Graves, Nyoka Cowden, MD [facility] &  [facility] Medicine at [employer], [facility]

## 2023-05-02 ENCOUNTER — Other Ambulatory Visit
Admission: RE | Admit: 2023-05-02 | Discharge: 2023-05-02 | Disposition: A | Discharge status: Home / Self Care | Payer: 59 | Source: Ambulatory Visit | Attending: Internal Medicine | Admitting: Internal Medicine

## 2023-05-02 ENCOUNTER — Ambulatory Visit (INDEPENDENT_AMBULATORY_CARE_PROVIDER_SITE_OTHER): Payer: 59 | Admitting: Internal Medicine

## 2023-05-02 ENCOUNTER — Encounter: Payer: Self-pay | Admitting: Internal Medicine

## 2023-05-02 VITALS — BP 120/78 | HR 88 | Ht 63.0 in | Wt 182.0 lb

## 2023-05-02 DIAGNOSIS — M353 Polymyalgia rheumatica: Secondary | ICD-10-CM | POA: Diagnosis not present

## 2023-05-02 DIAGNOSIS — Z7984 Long term (current) use of oral hypoglycemic drugs: Secondary | ICD-10-CM

## 2023-05-02 DIAGNOSIS — E119 Type 2 diabetes mellitus without complications: Secondary | ICD-10-CM | POA: Diagnosis not present

## 2023-05-02 DIAGNOSIS — F324 Major depressive disorder, single episode, in partial remission: Secondary | ICD-10-CM | POA: Diagnosis not present

## 2023-05-02 LAB — BASIC METABOLIC PANEL
Anion gap: 9 (ref 5–15)
BUN: 14 mg/dL (ref 6–20)
CO2: 24 mmol/L (ref 22–32)
Calcium: 9.3 mg/dL (ref 8.9–10.3)
Chloride: 103 mmol/L (ref 98–111)
Creatinine, Ser: 1.04 mg/dL — ABNORMAL HIGH (ref 0.44–1.00)
GFR, Estimated: >60 mL/min (ref >60)
Glucose, Bld: 152 mg/dL — ABNORMAL HIGH (ref 70–99)
Potassium: 3 mmol/L — ABNORMAL LOW (ref 3.5–5.1)
Sodium: 136 mmol/L (ref 135–145)

## 2023-05-02 LAB — SEDIMENTATION RATE: Sed Rate: 17 mm/hr (ref 0–30)

## 2023-05-02 LAB — HEMOGLOBIN A1C
Hgb A1c MFr Bld: 6.6 % — ABNORMAL HIGH (ref 4.8–5.6)
Mean Plasma Glucose: 142.72 mg/dL

## 2023-05-02 MED ORDER — SEMAGLUTIDE (1 MG/DOSE) 4 MG/3ML ~~LOC~~ SOPN
1.0000 mg | PEN_INJECTOR | SUBCUTANEOUS | 2 refills | Status: DC
Start: 2023-05-02 — End: 2023-08-05

## 2023-05-02 MED ORDER — CLONAZEPAM 0.25 MG PO TBDP
0.2500 mg | ORAL_TABLET | Freq: Two times a day (BID) | ORAL | 0 refills | Status: DC | PRN
Start: 2023-05-02 — End: 2023-10-14

## 2023-05-02 NOTE — Assessment & Plan Note (Signed)
Ozempic added last visit to metformin for improved control and aid with weight loss Lab Results  Component Value Date   HGBA1C 6.6 (H) 01/17/2023

## 2023-05-02 NOTE — Progress Notes (Signed)
Date:  05/02/2023   Name:  Nancy Skinner   DOB:  05/15/1966   MRN:  [id number]   Chief Complaint: PMR  Diabetes She presents for her follow-up diabetic visit. She has type 2 diabetes mellitus. Her disease course has been improving. Pertinent negatives for hypoglycemia include no headaches or tremors. Pertinent negatives for diabetes include no chest pain, no fatigue, no polydipsia and no polyuria. Current diabetic treatment includes oral agent (monotherapy) (and ozempic).   PMR - currently on 30 mg prednisone daily.  She feels well with no bone or muscle pain.  Lab Results  Component Value Date   NA 138 01/17/2023   K 3.8 01/17/2023   CO2 25 01/17/2023   GLUCOSE 110 (H) 01/17/2023   BUN 15 01/17/2023   CREATININE 0.81 01/17/2023   CALCIUM 9.4 01/17/2023   EGFR 85 01/17/2023   GFRNONAA >60 11/28/2022   Lab Results  Component Value Date   CHOL 227 (H) 01/17/2023   HDL 39 (L) 01/17/2023   LDLCALC 96 01/17/2023   LDLDIRECT 48 09/02/2019   TRIG 552 (HH) 01/17/2023   CHOLHDL 5.8 (H) 01/17/2023   Lab Results  Component Value Date   TSH 2.380 01/17/2023   Lab Results  Component Value Date   HGBA1C 6.6 (H) 01/17/2023   Lab Results  Component Value Date   WBC 9.5 01/17/2023   HGB 12.7 01/17/2023   HCT 37.8 01/17/2023   MCV 86 01/17/2023   PLT 341 01/17/2023   Lab Results  Component Value Date   ALT 34 02/17/2023   AST 35 02/17/2023   ALKPHOS 71 02/17/2023   BILITOT 0.1 (L) 02/17/2023   No results found for: "25OHVITD2", "25OHVITD3", "VD25OH"   Review of Systems  Constitutional:  Positive for unexpected weight change (has lost 8 lbs since starting Ozempic). Negative for appetite change, fatigue and fever.  HENT:  Negative for tinnitus and trouble swallowing.   Eyes:  Negative for visual disturbance.  Respiratory:  Negative for cough, chest tightness and shortness of breath.   Cardiovascular:  Negative for chest pain, palpitations and leg swelling.   Gastrointestinal:  Negative for abdominal pain.  Endocrine: Negative for polydipsia and polyuria.  Genitourinary:  Negative for dysuria and hematuria.  Musculoskeletal:  Negative for arthralgias.  Neurological:  Negative for tremors, numbness and headaches.  Psychiatric/Behavioral:  Negative for dysphoric mood.     Patient Active Problem List   Diagnosis Date Noted   BMI 34.0-34.9,adult 03/05/2023   Diabetes mellitus treated with oral medication (HCC) 03/05/2023   Current moderate episode of major depressive disorder without prior episode (HCC) 03/05/2023   PMR (polymyalgia rheumatica) (HCC) 02/26/2023   ESR raised 12/07/2022   Leukocytosis 08/08/2022   Status post hysterectomy 03/05/2022   Essential hypertension 04/16/2021   Hepatic steatosis 03/19/2021   Carpal tunnel syndrome of right wrist 07/10/2020   Screening for colon cancer    Coronary artery disease of native artery of native heart with stable angina pectoris (HCC) 01/18/2019   Major depression single episode, in partial remission (HCC) 01/18/2019   Neutrophilic leukocytosis 08/09/2018   Pulmonary nodule less than 6 cm determined by computed tomography of lung 07/30/2018   Night sweats 07/16/2018   Neck muscle spasm 07/15/2018   Mixed hyperlipidemia 07/15/2018   Tobacco use disorder, moderate, in sustained remission 07/15/2018   Migraine without aura and without status migrainosus, not intractable 06/24/2018   Gastroesophageal reflux disease without esophagitis 06/24/2018   Chronic midline low back pain without sciatica 06/24/2018  Allergies  Allergen Reactions   Oxycodone Other (See Comments)    "Black outs" and confusion   Dye Fdc Red [Red Dye] Other (See Comments)    Severe migraine    Past Surgical History:  Procedure Laterality Date   ABDOMINAL HYSTERECTOMY  2012   partial for bleeding   BACK SURGERY     BREAST BIOPSY Right    neg   BREAST BIOPSY Left 2015   4 bxs- neg   BREAST SURGERY Left     multiple biopsies   CARDIAC CATHETERIZATION     CATARACT EXTRACTION Right 12/19/2010   CATARACT EXTRACTION Left    CHOLECYSTECTOMY  1999   COLONOSCOPY WITH PROPOFOL N/A 03/31/2020   Procedure: COLONOSCOPY WITH PROPOFOL;  Surgeon: Midge Minium, MD;  Location: [facility] ENDOSCOPY;  Service: Endoscopy;  Laterality: N/A;   CORONARY PRESSURE/FFR STUDY N/A 07/22/2018   Procedure: INTRAVASCULAR PRESSURE WIRE/FFR STUDY;  Surgeon: Yvonne Kendall, MD;  Location: [facility] INVASIVE CV LAB;  Service: Cardiovascular;  Laterality: N/A;   EYE SURGERY     INSERTION OF MESH  03/27/2023   Procedure: INSERTION OF MESH;  Surgeon: Leafy Ro, MD;  Location: [facility] ORS;  Service: General;;   LEFT HEART CATH AND CORONARY ANGIOGRAPHY N/A 07/22/2018   Procedure: LEFT HEART CATH AND CORONARY ANGIOGRAPHY;  Surgeon: Yvonne Kendall, MD;  Location: [facility] INVASIVE CV LAB;  Service: Cardiovascular;  Laterality: N/A;   POSTERIOR LUMBAR FUSION 4 LEVEL  2000   l 4-5 fusion   VENTRAL HERNIA REPAIR N/A 03/27/2023   Procedure: HERNIA REPAIR VENTRAL ADULT, open;  Surgeon: Leafy Ro, MD;  Location: [facility] ORS;  Service: General;  Laterality: N/A;    Social History   Tobacco Use   Smoking status: Former    Packs/day: 0.25    Years: 40.00    Additional pack years: 0.00    Total pack years: 10.00    Types: Cigarettes    Quit date: 09/18/2019    Years since quitting: 3.6    Passive exposure: Past   Smokeless tobacco: Never  Vaping Use   Vaping Use: Never used  Substance Use Topics   Alcohol use: Not Currently    Comment: less than once a week.   Drug use: Never     Medication list has been reviewed and updated.  Current Meds  Medication Sig   albuterol (VENTOLIN HFA) 108 (90 Base) MCG/ACT inhaler INHALE 2 PUFFS INTO THE LUNGS EVERY 4 HOURS AS NEEDED FOR WHEEZE OR FOR SHORTNESS OF BREATH   aspirin EC 81 MG tablet Take 81 mg by mouth daily.   baclofen (LIORESAL) 10 MG tablet Take 1 tablet (10 mg total) by mouth 3  (three) times daily. (Patient taking differently: Take 10 mg by mouth 3 (three) times daily as needed for muscle spasms.)   buPROPion (WELLBUTRIN XL) 300 MG 24 hr tablet Take 1 tablet (300 mg total) by mouth daily.   clonazePAM (KLONOPIN) 0.25 MG disintegrating tablet Take 1 tablet (0.25 mg total) by mouth 2 (two) times daily as needed.   clotrimazole-betamethasone (LOTRISONE) cream APPLY 1 APPLICATION TOPICALLY 2 (TWO) TIMES DAILY. TO RASH ON LEGS (Patient taking differently: Apply 1 application  topically 2 (two) times daily as needed. To rash on legs)   cyclobenzaprine (FLEXERIL) 10 MG tablet TAKE 1 TABLET BY MOUTH THREE TIMES A DAY AS NEEDED FOR MUSCLE SPASM   escitalopram (LEXAPRO) 20 MG tablet TAKE 1 TABLET BY MOUTH EVERY DAY   Esomeprazole Magnesium (NEXIUM PO) Take 20 mg  by mouth daily.    fenofibrate (TRICOR) 145 MG tablet Take 1 tablet (145 mg total) by mouth daily.   furosemide (LASIX) 20 MG tablet TAKE 1 TABLET BY MOUTH EVERY DAY   hydrochlorothiazide (HYDRODIURIL) 25 MG tablet Take 1 tablet (25 mg total) by mouth daily.   HYDROcodone-acetaminophen (NORCO/VICODIN) 5-325 MG tablet Take 1-2 tablets by mouth every 6 (six) hours as needed for moderate pain.   ibuprofen (ADVIL) 600 MG tablet Take 1 tablet (600 mg total) by mouth every 6 (six) hours as needed.   KLOR-CON M10 10 MEQ tablet TAKE 1 TABLET BY MOUTH EVERY DAY   Lidocaine 4 % PTCH Apply 1 patch topically daily as needed (pain).    metFORMIN (GLUCOPHAGE) 500 MG tablet TAKE 1 TABLET BY MOUTH 2 TIMES DAILY WITH A MEAL.   Multiple Vitamin (MULTIVITAMIN) capsule Take 1 capsule by mouth daily.   ondansetron (ZOFRAN-ODT) 4 MG disintegrating tablet Take 1 tablet (4 mg total) by mouth every 8 (eight) hours as needed for nausea or vomiting.   predniSONE (DELTASONE) 10 MG tablet Take 4 tablets (40 mg total) by mouth daily with breakfast.   rosuvastatin (CRESTOR) 40 MG tablet Take 1 tablet (40 mg total) by mouth daily.   Semaglutide, 1  MG/DOSE, 4 MG/3ML SOPN Inject 1 mg into the skin once a week.   SUMAtriptan (IMITREX) 100 MG tablet TAKE 1 TABLET (100 MG TOTAL) BY MOUTH AS NEEDED FOR MIGRAINE. MAY REPEAT IN 2 HOURS IF NEEDED   triamcinolone (KENALOG) 0.025 % ointment Apply 1 application topically as needed. For rash   VRAYLAR 1.5 MG capsule TAKE 1 CAPSULE BY MOUTH DAILY.       05/02/2023    2:58 PM 03/21/2023   10:36 AM 03/05/2023    1:50 PM 02/17/2023    2:00 PM  GAD 7 : Generalized Anxiety Score  Nervous, Anxious, on Edge 2 3 1  0  Control/stop worrying 2 3 2  0  Worry too much - different things 2 0 2 0  Trouble relaxing 3 2 2 3   Restless 3 0 1 3  Easily annoyed or irritable 3 3 3 3   Afraid - awful might happen 0 0 1 0  Total GAD 7 Score 15 11 12 9   Anxiety Difficulty Somewhat difficult Somewhat difficult Somewhat difficult Extremely difficult       05/02/2023    2:54 PM 03/21/2023   10:36 AM 03/05/2023    1:50 PM  Depression screen PHQ 2/9  Decreased Interest 2 1 1   Down, Depressed, Hopeless 2 2 2   PHQ - 2 Score 4 3 3   Altered sleeping 3 2 1   Tired, decreased energy 3 2 2   Change in appetite 1 3 3   Feeling bad or failure about yourself  2 1 2   Trouble concentrating 0 0 0  Moving slowly or fidgety/restless 0 0 0  Suicidal thoughts  0 0  PHQ-9 Score 13 11 11   Difficult doing work/chores Somewhat difficult Not difficult at all Not difficult at all    BP Readings from Last 3 Encounters:  05/02/23 120/78  04/02/23 128/72  03/27/23 (!) 140/81    Physical Exam Vitals and nursing note reviewed.  Constitutional:      General: She is not in acute distress.    Appearance: She is well-developed.  HENT:     Head: Normocephalic and atraumatic.  Cardiovascular:     Rate and Rhythm: Normal rate and regular rhythm.     Pulses: Normal pulses.  Pulmonary:  Effort: Pulmonary effort is normal. No respiratory distress.  Musculoskeletal:     Right lower leg: No edema.     Left lower leg: No edema.  Skin:     General: Skin is warm and dry.     Findings: No rash.  Neurological:     Mental Status: She is alert and oriented to person, place, and time.  Psychiatric:        Mood and Affect: Mood normal.        Behavior: Behavior normal.    Diabetic Foot Exam - Simple   Simple Foot Form Diabetic Foot exam was performed with the following findings: Yes 05/02/2023  3:18 PM  Visual Inspection No deformities, no ulcerations, no other skin breakdown bilaterally: Yes Sensation Testing Intact to touch and monofilament testing bilaterally: Yes Pulse Check Posterior Tibialis and Dorsalis pulse intact bilaterally: Yes Comments      Wt Readings from Last 3 Encounters:  05/02/23 182 lb (82.6 kg)  04/02/23 190 lb (86.2 kg)  03/25/23 191 lb (86.6 kg)    BP 120/78   Pulse 88   Ht 5\' 3"  (1.6 m)   Wt 182 lb (82.6 kg)   SpO2 97%   BMI 32.24 kg/m   Assessment and Plan:  Problem List Items Addressed This Visit     Major depression single episode, in partial remission (HCC)   Relevant Medications   clonazePAM (KLONOPIN) 0.25 MG disintegrating tablet   PMR (polymyalgia rheumatica) (HCC) (Chronic)    Prednisone dose decreased to 30 mg one month ago. Symptoms are very well controlled. Will repeat ESR.   Lab Results  Component Value Date   ESRSEDRATE 17 04/02/2023  Will decrease to 20 mg prednisone daily.       Relevant Orders   Sedimentation rate   Diabetes mellitus treated with oral medication (HCC) - Primary    Ozempic added last visit to metformin for improved control and aid with weight loss Lab Results  Component Value Date   HGBA1C 6.6 (H) 01/17/2023        Relevant Medications   Semaglutide, 1 MG/DOSE, 4 MG/3ML SOPN   Other Relevant Orders   Basic metabolic panel   Hemoglobin A1c    Return in about 4 months (around 09/02/2023) for DM.   Partially dictated using Dragon software, any errors are not intentional.  Reubin Milan, MD [facility]  Medicine [place], [place]

## 2023-05-02 NOTE — Patient Instructions (Signed)
Reduce prednisone to 20 mg per day (2 tabs)  I will let you know when to come get the Sed Rate done for follow up.  Make appointment with me for 4 months.

## 2023-05-02 NOTE — Assessment & Plan Note (Addendum)
Prednisone dose decreased to 30 mg one month ago. Symptoms are very well controlled. Will repeat ESR.   Lab Results  Component Value Date   ESRSEDRATE 17 04/02/2023  Will decrease to 20 mg prednisone daily.

## 2023-05-13 ENCOUNTER — Ambulatory Visit
Admission: RE | Admit: 2023-05-13 | Discharge: 2023-05-13 | Disposition: A | Discharge status: Home / Self Care | Payer: 59 | Source: Ambulatory Visit | Attending: Internal Medicine | Admitting: Internal Medicine

## 2023-05-13 DIAGNOSIS — Z1231 Encounter for screening mammogram for malignant neoplasm of breast: Secondary | ICD-10-CM | POA: Insufficient documentation

## 2023-05-23 ENCOUNTER — Encounter: Payer: Self-pay | Admitting: Internal Medicine

## 2023-05-23 ENCOUNTER — Encounter: Payer: 59 | Admitting: Internal Medicine

## 2023-05-23 NOTE — Progress Notes (Deleted)
Date:  05/23/2023   Name:  Nancy Skinner   DOB:  Jun 04, 1966   MRN:  [id number]   Chief Complaint: No chief complaint on file.  HPI  Lab Results  Component Value Date   NA 136 05/02/2023   K 3.0 (L) 05/02/2023   CO2 24 05/02/2023   GLUCOSE 152 (H) 05/02/2023   BUN 14 05/02/2023   CREATININE 1.04 (H) 05/02/2023   CALCIUM 9.3 05/02/2023   EGFR 85 01/17/2023   GFRNONAA >60 05/02/2023   Lab Results  Component Value Date   CHOL 227 (H) 01/17/2023   HDL 39 (L) 01/17/2023   LDLCALC 96 01/17/2023   LDLDIRECT 48 09/02/2019   TRIG 552 (HH) 01/17/2023   CHOLHDL 5.8 (H) 01/17/2023   Lab Results  Component Value Date   TSH 2.380 01/17/2023   Lab Results  Component Value Date   HGBA1C 6.6 (H) 05/02/2023   Lab Results  Component Value Date   WBC 9.5 01/17/2023   HGB 12.7 01/17/2023   HCT 37.8 01/17/2023   MCV 86 01/17/2023   PLT 341 01/17/2023   Lab Results  Component Value Date   ALT 34 02/17/2023   AST 35 02/17/2023   ALKPHOS 71 02/17/2023   BILITOT 0.1 (L) 02/17/2023   No results found for: "25OHVITD2", "25OHVITD3", "VD25OH"   Review of Systems  Patient Active Problem List   Diagnosis Date Noted   BMI 34.0-34.9,adult 03/05/2023   Diabetes mellitus treated with oral medication (HCC) 03/05/2023   Current moderate episode of major depressive disorder without prior episode (HCC) 03/05/2023   PMR (polymyalgia rheumatica) (HCC) 02/26/2023   ESR raised 12/07/2022   Leukocytosis 08/08/2022   Status post hysterectomy 03/05/2022   Essential hypertension 04/16/2021   Hepatic steatosis 03/19/2021   Carpal tunnel syndrome of right wrist 07/10/2020   Screening for colon cancer    Coronary artery disease of native artery of native heart with stable angina pectoris (HCC) 01/18/2019   Major depression single episode, in partial remission (HCC) 01/18/2019   Neutrophilic leukocytosis 08/09/2018   Pulmonary nodule less than 6 cm determined by computed tomography of lung  07/30/2018   Night sweats 07/16/2018   Neck muscle spasm 07/15/2018   Mixed hyperlipidemia 07/15/2018   Tobacco use disorder, moderate, in sustained remission 07/15/2018   Migraine without aura and without status migrainosus, not intractable 06/24/2018   Gastroesophageal reflux disease without esophagitis 06/24/2018   Chronic midline low back pain without sciatica 06/24/2018    Allergies  Allergen Reactions   Oxycodone Other (See Comments)    "Black outs" and confusion   Dye Fdc Red [Red Dye] Other (See Comments)    Severe migraine    Past Surgical History:  Procedure Laterality Date   ABDOMINAL HYSTERECTOMY  2012   partial for bleeding   BACK SURGERY     BREAST BIOPSY Right    neg   BREAST BIOPSY Left 2015   4 bxs- neg   BREAST SURGERY Left    multiple biopsies   CARDIAC CATHETERIZATION     CATARACT EXTRACTION Right 12/19/2010   CATARACT EXTRACTION Left    CHOLECYSTECTOMY  1999   COLONOSCOPY WITH PROPOFOL N/A 03/31/2020   Procedure: COLONOSCOPY WITH PROPOFOL;  Surgeon: Midge Minium, MD;  Location: [facility] ENDOSCOPY;  Service: Endoscopy;  Laterality: N/A;   CORONARY PRESSURE/FFR STUDY N/A 07/22/2018   Procedure: INTRAVASCULAR PRESSURE WIRE/FFR STUDY;  Surgeon: Yvonne Kendall, MD;  Location: [facility] INVASIVE CV LAB;  Service: Cardiovascular;  Laterality: N/A;  EYE SURGERY     INSERTION OF MESH  03/27/2023   Procedure: INSERTION OF MESH;  Surgeon: Leafy Ro, MD;  Location: [facility] ORS;  Service: General;;   LEFT HEART CATH AND CORONARY ANGIOGRAPHY N/A 07/22/2018   Procedure: LEFT HEART CATH AND CORONARY ANGIOGRAPHY;  Surgeon: Yvonne Kendall, MD;  Location: [facility] INVASIVE CV LAB;  Service: Cardiovascular;  Laterality: N/A;   POSTERIOR LUMBAR FUSION 4 LEVEL  2000   l 4-5 fusion   VENTRAL HERNIA REPAIR N/A 03/27/2023   Procedure: HERNIA REPAIR VENTRAL ADULT, open;  Surgeon: Leafy Ro, MD;  Location: [facility] ORS;  Service: General;  Laterality: N/A;    Social History    Tobacco Use   Smoking status: Former    Packs/day: 0.25    Years: 40.00    Additional pack years: 0.00    Total pack years: 10.00    Types: Cigarettes    Quit date: 09/18/2019    Years since quitting: 3.6    Passive exposure: Past   Smokeless tobacco: Never  Vaping Use   Vaping Use: Never used  Substance Use Topics   Alcohol use: Not Currently    Comment: less than once a week.   Drug use: Never     Medication list has been reviewed and updated.  No outpatient medications have been marked as taking for the 05/23/23 encounter (Appointment) with Reubin Milan, MD.       05/02/2023    2:58 PM 03/21/2023   10:36 AM 03/05/2023    1:50 PM 02/17/2023    2:00 PM  GAD 7 : Generalized Anxiety Score  Nervous, Anxious, on Edge 2 3 1  0  Control/stop worrying 2 3 2  0  Worry too much - different things 2 0 2 0  Trouble relaxing 3 2 2 3   Restless 3 0 1 3  Easily annoyed or irritable 3 3 3 3   Afraid - awful might happen 0 0 1 0  Total GAD 7 Score 15 11 12 9   Anxiety Difficulty Somewhat difficult Somewhat difficult Somewhat difficult Extremely difficult       05/02/2023    2:54 PM 03/21/2023   10:36 AM 03/05/2023    1:50 PM  Depression screen PHQ 2/9  Decreased Interest 2 1 1   Down, Depressed, Hopeless 2 2 2   PHQ - 2 Score 4 3 3   Altered sleeping 3 2 1   Tired, decreased energy 3 2 2   Change in appetite 1 3 3   Feeling bad or failure about yourself  2 1 2   Trouble concentrating 0 0 0  Moving slowly or fidgety/restless 0 0 0  Suicidal thoughts  0 0  PHQ-9 Score 13 11 11   Difficult doing work/chores Somewhat difficult Not difficult at all Not difficult at all    BP Readings from Last 3 Encounters:  05/02/23 120/78  04/02/23 128/72  03/27/23 (!) 140/81    Physical Exam  Wt Readings from Last 3 Encounters:  05/02/23 182 lb (82.6 kg)  04/02/23 190 lb (86.2 kg)  03/25/23 191 lb (86.6 kg)    There were no vitals taken for this visit.  Assessment and Plan:  Problem  List Items Addressed This Visit   None   No follow-ups on file.   Partially dictated using Dragon software, any errors are not intentional.  Reubin Milan, MD [facility] [place], [place]

## 2023-05-25 ENCOUNTER — Other Ambulatory Visit: Payer: Self-pay | Admitting: Internal Medicine

## 2023-05-25 DIAGNOSIS — E118 Type 2 diabetes mellitus with unspecified complications: Secondary | ICD-10-CM

## 2023-05-25 NOTE — Progress Notes (Signed)
Error

## 2023-05-30 ENCOUNTER — Other Ambulatory Visit: Payer: Self-pay | Admitting: Internal Medicine

## 2023-05-30 DIAGNOSIS — E782 Mixed hyperlipidemia: Secondary | ICD-10-CM

## 2023-05-30 DIAGNOSIS — R609 Edema, unspecified: Secondary | ICD-10-CM

## 2023-06-22 ENCOUNTER — Other Ambulatory Visit: Payer: Self-pay | Admitting: Medical

## 2023-06-22 ENCOUNTER — Other Ambulatory Visit: Payer: Self-pay | Admitting: Internal Medicine

## 2023-06-22 DIAGNOSIS — M545 Low back pain, unspecified: Secondary | ICD-10-CM

## 2023-06-22 DIAGNOSIS — F324 Major depressive disorder, single episode, in partial remission: Secondary | ICD-10-CM

## 2023-06-23 NOTE — Telephone Encounter (Signed)
Called patient to schedule appointment.

## 2023-06-24 ENCOUNTER — Encounter: Payer: Self-pay | Admitting: Internal Medicine

## 2023-06-24 ENCOUNTER — Ambulatory Visit (INDEPENDENT_AMBULATORY_CARE_PROVIDER_SITE_OTHER): Payer: 59 | Admitting: Internal Medicine

## 2023-06-24 ENCOUNTER — Other Ambulatory Visit
Admission: RE | Admit: 2023-06-24 | Discharge: 2023-06-24 | Disposition: A | Discharge status: Home / Self Care | Payer: 59 | Attending: Internal Medicine | Admitting: Internal Medicine

## 2023-06-24 VITALS — BP 118/76 | HR 76 | Ht 63.0 in | Wt 182.0 lb

## 2023-06-24 DIAGNOSIS — Z9484 Stem cells transplant status: Secondary | ICD-10-CM | POA: Insufficient documentation

## 2023-06-24 DIAGNOSIS — E119 Type 2 diabetes mellitus without complications: Secondary | ICD-10-CM | POA: Insufficient documentation

## 2023-06-24 DIAGNOSIS — M353 Polymyalgia rheumatica: Secondary | ICD-10-CM

## 2023-06-24 LAB — SEDIMENTATION RATE: Sed Rate: 19 mm/hr (ref 0–30)

## 2023-06-24 NOTE — Assessment & Plan Note (Addendum)
Doing well on prednisone 10 mg per day for the past 2 weeks Symptoms almost completely resolved. No issues with blood sugar control - on ozempic for T2DM Will continue 10 mg per day for 2 more weeks then decrease to 5 mg per day for 4 weeks then 5 mg every other day if Sed Rate is normal.

## 2023-06-24 NOTE — Progress Notes (Signed)
Date:  06/24/2023   Name:  Nancy Skinner   DOB:  02/11/1966   MRN:  [id number]   Chief Complaint: Labs Only (Sed rate recheck.)  HPI PMR - last sed rate was 17 6 weeks ago.Sed Rate at onset of symptoms was 68.   Prednisone decreased to 20 mg per day last visit.  Currently taking 10 mg per day. No muscle pain or stiffness, no weakness.  No weight gain or other concerns.  Lab Results  Component Value Date   NA 136 05/02/2023   K 3.0 (L) 05/02/2023   CO2 24 05/02/2023   GLUCOSE 152 (H) 05/02/2023   BUN 14 05/02/2023   CREATININE 1.04 (H) 05/02/2023   CALCIUM 9.3 05/02/2023   EGFR 85 01/17/2023   GFRNONAA >60 05/02/2023   Lab Results  Component Value Date   CHOL 227 (H) 01/17/2023   HDL 39 (L) 01/17/2023   LDLCALC 96 01/17/2023   LDLDIRECT 48 09/02/2019   TRIG 552 (HH) 01/17/2023   CHOLHDL 5.8 (H) 01/17/2023   Lab Results  Component Value Date   TSH 2.380 01/17/2023   Lab Results  Component Value Date   HGBA1C 6.6 (H) 05/02/2023   Lab Results  Component Value Date   WBC 9.5 01/17/2023   HGB 12.7 01/17/2023   HCT 37.8 01/17/2023   MCV 86 01/17/2023   PLT 341 01/17/2023   Lab Results  Component Value Date   ALT 34 02/17/2023   AST 35 02/17/2023   ALKPHOS 71 02/17/2023   BILITOT 0.1 (L) 02/17/2023   No results found for: "25OHVITD2", "25OHVITD3", "VD25OH"   Review of Systems  Constitutional:  Negative for fatigue and unexpected weight change.  Musculoskeletal:  Negative for arthralgias, gait problem, joint swelling and myalgias.  Neurological:  Negative for dizziness, light-headedness and headaches.  Psychiatric/Behavioral:  Negative for dysphoric mood and sleep disturbance. The patient is not nervous/anxious.     Patient Active Problem List   Diagnosis Date Noted   BMI 34.0-34.9,adult 03/05/2023   Diabetes mellitus treated with oral medication (HCC) 03/05/2023   Current moderate episode of major depressive disorder without prior episode (HCC)  03/05/2023   PMR (polymyalgia rheumatica) (HCC) 02/26/2023   Status post hysterectomy 03/05/2022   Essential hypertension 04/16/2021   Hepatic steatosis 03/19/2021   Carpal tunnel syndrome of right wrist 07/10/2020   Screening for colon cancer    Coronary artery disease of native artery of native heart with stable angina pectoris (HCC) 01/18/2019   Major depression single episode, in partial remission (HCC) 01/18/2019   Neutrophilic leukocytosis 08/09/2018   Pulmonary nodule less than 6 cm determined by computed tomography of lung 07/30/2018   Night sweats 07/16/2018   Neck muscle spasm 07/15/2018   Mixed hyperlipidemia 07/15/2018   Tobacco use disorder, moderate, in sustained remission 07/15/2018   Migraine without aura and without status migrainosus, not intractable 06/24/2018   Gastroesophageal reflux disease without esophagitis 06/24/2018   Chronic midline low back pain without sciatica 06/24/2018    Allergies  Allergen Reactions   Oxycodone Other (See Comments)    "Black outs" and confusion   Dye Fdc Red [Red Dye] Other (See Comments)    Severe migraine    Past Surgical History:  Procedure Laterality Date   ABDOMINAL HYSTERECTOMY  2012   partial for bleeding   BACK SURGERY     BREAST BIOPSY Right    neg   BREAST BIOPSY Left 2015   4 bxs- neg   BREAST SURGERY Left  multiple biopsies   CARDIAC CATHETERIZATION     CATARACT EXTRACTION Right 12/19/2010   CATARACT EXTRACTION Left    CHOLECYSTECTOMY  1999   COLONOSCOPY WITH PROPOFOL N/A 03/31/2020   Procedure: COLONOSCOPY WITH PROPOFOL;  Surgeon: Midge Minium, MD;  Location: [facility] END[facility];  Service: Endoscopy;  Laterality: N/A;   CORONARY PRESSURE/FFR STUDY N/A 07/22/2018   Procedure: INTRAVASCULAR PRESSURE WIRE/FFR STUDY;  Surgeon: Yvonne Kendall, MD;  Location: [facility];  Service: Cardiovascular;  Laterality: N/A;   EYE SURGERY     INSERTION OF MESH  03/27/2023   Procedure: INSERTION OF MESH;  Surgeon:  Leafy Ro, MD;  Location: [facility] ORS;  Service: General;;   LEFT HEART CATH AND CORONARY ANGIOGRAPHY N/A 07/22/2018   Procedure: LEFT HEART CATH AND CORONARY ANGIOGRAPHY;  Surgeon: Yvonne Kendall, MD;  Location: [facility];  Service: Cardiovascular;  Laterality: N/A;   POSTERIOR LUMBAR FUSION 4 LEVEL  2000   l 4-5 fusion   VENTRAL HERNIA REPAIR N/A 03/27/2023   Procedure: HERNIA REPAIR VENTRAL ADULT, open;  Surgeon: Leafy Ro, MD;  Location: [facility] ORS;  Service: General;  Laterality: N/A;    Social History   Tobacco Use   Smoking status: Former    Packs/day: 0.25    Years: 40.00    Additional pack years: 0.00    Total pack years: 10.00    Types: Cigarettes    Quit date: 09/18/2019    Years since quitting: 3.7    Passive exposure: Past   Smokeless tobacco: Never  Vaping Use   Vaping Use: Never used  Substance Use Topics   Alcohol use: Not Currently    Comment: less than once a week.   Drug use: Never     Medication list has been reviewed and updated.  Current Meds  Medication Sig   albuterol (VENTOLIN HFA) 108 (90 Base) MCG/ACT inhaler INHALE 2 PUFFS INTO THE LUNGS EVERY 4 HOURS AS NEEDED FOR WHEEZE OR FOR SHORTNESS OF BREATH   aspirin EC 81 MG tablet Take 81 mg by mouth daily.   baclofen (LIORESAL) 10 MG tablet Take 1 tablet (10 mg total) by mouth 3 (three) times daily. (Patient taking differently: Take 10 mg by mouth 3 (three) times daily as needed for muscle spasms.)   buPROPion (WELLBUTRIN XL) 300 MG 24 hr tablet TAKE 1 TABLET BY MOUTH EVERY DAY   cariprazine (VRAYLAR) 1.5 MG capsule TAKE 1 CAPSULE BY MOUTH EVERY DAY   clonazePAM (KLONOPIN) 0.25 MG disintegrating tablet Take 1 tablet (0.25 mg total) by mouth 2 (two) times daily as needed.   clotrimazole-betamethasone (LOTRISONE) cream APPLY 1 APPLICATION TOPICALLY 2 (TWO) TIMES DAILY. TO RASH ON LEGS (Patient taking differently: Apply 1 application  topically 2 (two) times daily as needed. To rash on legs)    cyclobenzaprine (FLEXERIL) 10 MG tablet TAKE 1 TABLET BY MOUTH THREE TIMES A DAY AS NEEDED FOR MUSCLE SPASM   escitalopram (LEXAPRO) 20 MG tablet TAKE 1 TABLET BY MOUTH EVERY DAY   Esomeprazole Magnesium (NEXIUM PO) Take 20 mg by mouth daily.    fenofibrate (TRICOR) 145 MG tablet TAKE 1 TABLET BY MOUTH EVERY DAY   furosemide (LASIX) 20 MG tablet TAKE 1 TABLET BY MOUTH EVERY DAY   hydrochlorothiazide (HYDRODIURIL) 25 MG tablet Take 1 tablet (25 mg total) by mouth daily.   HYDROcodone-acetaminophen (NORCO/VICODIN) 5-325 MG tablet Take 1-2 tablets by mouth every 6 (six) hours as needed for moderate pain.   ibuprofen (ADVIL) 600 MG  tablet Take 1 tablet (600 mg total) by mouth every 6 (six) hours as needed.   KLOR-CON M10 10 MEQ tablet TAKE 1 TABLET BY MOUTH EVERY DAY   Lidocaine 4 % PTCH Apply 1 patch topically daily as needed (pain).    metFORMIN (GLUCOPHAGE) 500 MG tablet TAKE 1 TABLET BY MOUTH 2 TIMES DAILY WITH A MEAL.   Multiple Vitamin (MULTIVITAMIN) capsule Take 1 capsule by mouth daily.   ondansetron (ZOFRAN-ODT) 4 MG disintegrating tablet Take 1 tablet (4 mg total) by mouth every 8 (eight) hours as needed for nausea or vomiting.   predniSONE (DELTASONE) 10 MG tablet Take 4 tablets (40 mg total) by mouth daily with breakfast. (Patient taking differently: Take 10 mg by mouth daily with breakfast.)   rosuvastatin (CRESTOR) 40 MG tablet TAKE 1 TABLET BY MOUTH EVERY DAY   Semaglutide, 1 MG/DOSE, 4 MG/3ML SOPN Inject 1 mg into the skin once a week.   SUMAtriptan (IMITREX) 100 MG tablet TAKE 1 TABLET (100 MG TOTAL) BY MOUTH AS NEEDED FOR MIGRAINE. MAY REPEAT IN 2 HOURS IF NEEDED   triamcinolone (KENALOG) 0.025 % ointment Apply 1 application topically as needed. For rash       06/24/2023    2:25 PM 05/02/2023    2:58 PM 03/21/2023   10:36 AM 03/05/2023    1:50 PM  GAD 7 : Generalized Anxiety Score  Nervous, Anxious, on Edge 0 2 3 1   Control/stop worrying 0 2 3 2   Worry too much - different  things 0 2 0 2  Trouble relaxing 0 3 2 2   Restless 0 3 0 1  Easily annoyed or irritable 0 3 3 3   Afraid - awful might happen 0 0 0 1  Total GAD 7 Score 0 15 11 12   Anxiety Difficulty Not difficult at all Somewhat difficult Somewhat difficult Somewhat difficult       06/24/2023    2:25 PM 05/02/2023    2:54 PM 03/21/2023   10:36 AM  Depression screen PHQ 2/9  Decreased Interest 0 2 1  Down, Depressed, Hopeless 0 2 2  PHQ - 2 Score 0 4 3  Altered sleeping 0 3 2  Tired, decreased energy 0 3 2  Change in appetite 0 1 3  Feeling bad or failure about yourself  0 2 1  Trouble concentrating 0 0 0  Moving slowly or fidgety/restless 0 0 0  Suicidal thoughts 0  0  PHQ-9 Score 0 13 11  Difficult doing work/chores Not difficult at all Somewhat difficult Not difficult at all    BP Readings from Last 3 Encounters:  06/24/23 118/76  05/02/23 120/78  04/02/23 128/72    Physical Exam Vitals and nursing note reviewed.  Constitutional:      General: She is not in acute distress.    Appearance: She is well-developed.  HENT:     Head: Normocephalic and atraumatic.  Cardiovascular:     Rate and Rhythm: Normal rate and regular rhythm.  Pulmonary:     Effort: Pulmonary effort is normal. No respiratory distress.     Breath sounds: No wheezing or rhonchi.  Skin:    General: Skin is warm and dry.     Findings: No rash.  Neurological:     Mental Status: She is alert and oriented to person, place, and time.  Psychiatric:        Mood and Affect: Mood normal.        Behavior: Behavior normal.  Wt Readings from Last 3 Encounters:  06/24/23 182 lb (82.6 kg)  05/02/23 182 lb (82.6 kg)  04/02/23 190 lb (86.2 kg)    BP 118/76   Pulse 76   Ht 5\' 3"  (1.6 m)   Wt 182 lb (82.6 kg)   SpO2 96%   BMI 32.24 kg/m   Assessment and Plan:  Problem List Items Addressed This Visit     PMR (polymyalgia rheumatica) (HCC) - Primary (Chronic)    Doing well on prednisone 10 mg per day for the past  2 weeks Symptoms almost completely resolved. No issues with blood sugar control - on ozempic for T2DM Will continue 10 mg per day for 2 more weeks then decrease to 5 mg per day for 4 weeks then 5 mg every other day if Sed Rate is normal.      Relevant Orders   Sedimentation rate    No follow-ups on file.   Partially dictated using Dragon software, any errors are not intentional.  Reubin Milan, MD [facility] Medicine [place], [place]

## 2023-07-25 ENCOUNTER — Other Ambulatory Visit: Payer: Self-pay | Admitting: Internal Medicine

## 2023-08-05 ENCOUNTER — Other Ambulatory Visit: Payer: Self-pay

## 2023-08-05 DIAGNOSIS — E119 Type 2 diabetes mellitus without complications: Secondary | ICD-10-CM

## 2023-08-05 MED ORDER — SEMAGLUTIDE (1 MG/DOSE) 4 MG/3ML ~~LOC~~ SOPN
1.0000 mg | PEN_INJECTOR | SUBCUTANEOUS | 0 refills | Status: DC
Start: 2023-08-05 — End: 2023-09-10

## 2023-08-11 ENCOUNTER — Other Ambulatory Visit: Payer: Self-pay | Admitting: Internal Medicine

## 2023-08-11 DIAGNOSIS — E119 Type 2 diabetes mellitus without complications: Secondary | ICD-10-CM

## 2023-08-11 DIAGNOSIS — F321 Major depressive disorder, single episode, moderate: Secondary | ICD-10-CM

## 2023-08-11 DIAGNOSIS — G8929 Other chronic pain: Secondary | ICD-10-CM

## 2023-08-11 DIAGNOSIS — E118 Type 2 diabetes mellitus with unspecified complications: Secondary | ICD-10-CM

## 2023-08-12 NOTE — Telephone Encounter (Signed)
Requested medications are due for refill today.  yes  Requested medications are on the active medications list.  yes  Last refill. 06/23/2023 #90 0 rf  Future visit scheduled.   yes  Notes to clinic.  Refill not delegated.    Requested Prescriptions  Pending Prescriptions Disp Refills   cyclobenzaprine (FLEXERIL) 10 MG tablet [Pharmacy Med Name: CYCLOBENZAPRINE 10 MG TABLET] 90 tablet 0    Sig: TAKE 1 TABLET BY MOUTH THREE TIMES A DAY AS NEEDED FOR MUSCLE SPASM     Not Delegated - Analgesics:  Muscle Relaxants Failed - 08/11/2023  7:45 AM      Failed - This refill cannot be delegated      Passed - Valid encounter within last 6 months    Recent Outpatient Visits           1 month ago PMR (polymyalgia rheumatica) (HCC)   [place] [facility] at [facility], Nyoka Cowden, MD   3 months ago Diabetes mellitus treated with oral medication [facility])   [place] [facility] at [facility], Nyoka Cowden, MD   4 months ago PMR (polymyalgia rheumatica) [facility])   [place] [facility] at [facility], Nyoka Cowden, MD   4 months ago Ventral hernia without obstruction or gangrene   [place] [facility] at [facility], Nyoka Cowden, MD   5 months ago Major depression single episode, in partial remission [facility])   [place] [facility] at [facility], Nyoka Cowden, MD       Future Appointments             In 3 weeks Judithann Graves Nyoka Cowden, MD [facility] Primary [facility] at [facility], [facility]   In 5 months Judithann Graves, Nyoka Cowden, MD [facility] Primary Care & Sports Medicine at [facility], [facility]            Signed Prescriptions Disp Refills   escitalopram (LEXAPRO) 20 MG tablet 90 tablet 1    Sig: TAKE 1 TABLET BY MOUTH EVERY DAY     Psychiatry:  Antidepressants - SSRI Passed - 08/11/2023  7:45 AM      Passed - Completed PHQ-2 or  PHQ-9 in the last 360 days      Passed - Valid encounter within last 6 months    Recent Outpatient Visits           1 month ago PMR (polymyalgia rheumatica) (HCC)   [place] [facility] at [facility], Nyoka Cowden, MD   3 months ago Diabetes mellitus treated with oral medication [facility])   [place] [facility] at [facility], Nyoka Cowden, MD   4 months ago PMR (polymyalgia rheumatica) [facility])   [place] [facility] Medicine at [facility], Nyoka Cowden, MD   4 months ago Ventral hernia without obstruction or gangrene   [place] [facility] Medicine at [facility], Nyoka Cowden, MD   5 months ago Major depression single episode, in partial remission [facility])   [place] [facility] Medicine at [facility], Nyoka Cowden, MD       Future Appointments             In 3 weeks Judithann Graves, Nyoka Cowden, MD [facility] Primary [facility] & Sports Medicine at [facility]   In 5 months Judithann Graves, Nyoka Cowden, MD [facility] Primary Care & Sports Medicine at [facility], [facility]            Refused Prescriptions Disp Refills   OZEMPIC, 0.25 OR 0.5 MG/DOSE, 2 MG/3ML SOPN [Pharmacy Med Name: OZEMPIC 0.25-0.5 MG/DOSE PEN]      Sig: INJECT 0.5 MG INTO THE SKIN ONE TIME PER WEEK     Endocrinology:  Diabetes - GLP-1 Receptor Agonists - semaglutide Failed - 08/11/2023  7:45 AM      Failed - HBA1C in normal range and within 180 days    Hemoglobin A1C  Date Value Ref Range Status  06/26/2018 5.9  Final   Hgb A1c MFr Bld  Date Value Ref Range Status  05/02/2023 6.6 (H) 4.8 - 5.6 % Final    Comment:    (NOTE) Pre diabetes:          5.7%-6.4%  Diabetes:              >6.4%  Glycemic control for   <7.0% adults with diabetes          Failed - Cr in normal range and within 360 days    Creatinine, Ser  Date Value Ref Range Status  05/02/2023 1.04 (H) 0.44 - 1.00 mg/dL Final          Passed - Valid encounter within last 6 months    Recent Outpatient Visits           1 month ago PMR (polymyalgia rheumatica) (HCC)   [place] [facility] at [facility], Nyoka Cowden, MD   3 months ago Diabetes mellitus treated with oral medication [facility])   [place] Primary Care & Sports Medicine at [facility], Nyoka Cowden, MD   4 months ago PMR (polymyalgia rheumatica) [facility])   [place] [facility] at [facility], Nyoka Cowden, MD   4 months ago Ventral hernia without obstruction or gangrene   [place] [facility] Medicine at [facility], Nyoka Cowden, MD   5 months ago Major depression single episode, in partial remission [facility])   [place] [facility] & [facility] Medicine at [facility], Nyoka Cowden, MD       Future Appointments             In 3 weeks Judithann Graves Nyoka Cowden, MD [facility] Primary Care & Sports Medicine at [facility], [facility]   In 5 months Judithann Graves, Nyoka Cowden, MD [facility] Primary Care & Sports Medicine at [facility], [facility]             OZEMPIC, 1 MG/DOSE, 4 MG/3ML SOPN [Pharmacy Med Name: OZEMPIC 4 MG/3 ML (1 MG/DOSE)]      Sig: INJECT 1MG  INTO THE SKIN ONCE A WEEK     Endocrinology:  Diabetes - GLP-1 Receptor Agonists - semaglutide Failed - 08/11/2023  7:45 AM      Failed - HBA1C in normal range and within 180 days    Hemoglobin A1C  Date Value Ref Range Status  06/26/2018 5.9  Final   Hgb A1c MFr Bld  Date Value Ref Range Status  05/02/2023 6.6 (H) 4.8 - 5.6 % Final    Comment:    (NOTE) Pre diabetes:          5.7%-6.4%  Diabetes:              >6.4%  Glycemic control  for   <7.0% adults with diabetes          Failed - Cr in normal range and within 360 days    Creatinine, Ser  Date Value Ref Range Status  05/02/2023 1.04 (H) 0.44 - 1.00 mg/dL Final         Passed - Valid encounter within last 6 months    Recent Outpatient Visits            1 month ago PMR (polymyalgia rheumatica) (HCC)   [place] [facility] at [facility], Nyoka Cowden, MD   3 months ago Diabetes mellitus treated with oral medication [facility])   [place] [facility] at [facility], Nyoka Cowden, MD   4 months ago PMR (polymyalgia rheumatica) [facility])   [place] [facility] Medicine at [facility], Nyoka Cowden, MD   4 months ago Ventral hernia without obstruction or gangrene   [place] [facility] at [facility], Nyoka Cowden, MD   5 months ago Major depression single episode, in partial remission [facility])   [place] [facility] Medicine at [facility], Nyoka Cowden, MD       Future Appointments             In 3 weeks Judithann Graves Nyoka Cowden, MD [facility] Primary Care & Sports Medicine at [facility], [facility]   In 5 months Judithann Graves, Nyoka Cowden, MD [facility] Primary Care & Sports Medicine at [facility], [facility]

## 2023-08-12 NOTE — Telephone Encounter (Signed)
Requested Prescriptions  Pending Prescriptions Disp Refills   escitalopram (LEXAPRO) 20 MG tablet [Pharmacy Med Name: ESCITALOPRAM 20 MG TABLET] 90 tablet 1    Sig: TAKE 1 TABLET BY MOUTH EVERY DAY     Psychiatry:  Antidepressants - SSRI Passed - 08/11/2023  7:45 AM      Passed - Completed PHQ-2 or PHQ-9 in the last 360 days      Passed - Valid encounter within last 6 months    Recent Outpatient Visits           1 month ago PMR (polymyalgia rheumatica) (HCC)   [place] [facility] at [facility], Nyoka Cowden, MD   3 months ago Diabetes mellitus treated with oral medication [facility])   [place] [facility] at [facility], Nyoka Cowden, MD   4 months ago PMR (polymyalgia rheumatica) [facility])   [place] [facility] at [facility], Nyoka Cowden, MD   4 months ago Ventral hernia without obstruction or gangrene   [place] [facility] at [facility], Nyoka Cowden, MD   5 months ago Major depression single episode, in partial remission [facility])   [place] [facility] at [facility], Nyoka Cowden, MD       Future Appointments             In 3 weeks Reubin Milan, MD [facility] at [facility], [facility]   In 5 months Reubin Milan, MD [facility] at [facility], [facility]             OZEMPIC, 0.25 OR 0.5 MG/DOSE, 2 MG/3ML SOPN [Pharmacy Med Name: OZEMPIC 0.25-0.5 MG/DOSE PEN]      Sig: INJECT 0.5 MG INTO THE SKIN ONE TIME PER WEEK     Endocrinology:  Diabetes - GLP-1 Receptor Agonists - semaglutide Failed - 08/11/2023  7:45 AM      Failed - HBA1C in normal range and within 180 days    Hemoglobin A1C  Date Value Ref Range Status  06/26/2018 5.9  Final   Hgb A1c MFr Bld  Date Value Ref Range Status  05/02/2023 6.6 (H) 4.8 - 5.6 % Final    Comment:    (NOTE) Pre  diabetes:          5.7%-6.4%  Diabetes:              >6.4%  Glycemic control for   <7.0% adults with diabetes          Failed - Cr in normal range and within 360 days    Creatinine, Ser  Date Value Ref Range Status  05/02/2023 1.04 (H) 0.44 - 1.00 mg/dL Final         Passed - Valid encounter within last 6 months    Recent Outpatient Visits           1 month ago PMR (polymyalgia rheumatica) (HCC)   [place] [facility] at [facility], Nyoka Cowden, MD   3 months ago Diabetes mellitus treated with oral medication [facility])   [place] [facility] at [facility], Nyoka Cowden, MD   4 months ago PMR (polymyalgia rheumatica) [facility])   [place] [facility] at [facility], Nyoka Cowden, MD   4 months ago Ventral hernia  without obstruction or gangrene   [place] [facility] at [facility], Nyoka Cowden, MD   5 months ago Major depression single episode, in partial remission [facility])   [place] [facility] at [facility], Nyoka Cowden, MD       Future Appointments             In 3 weeks Reubin Milan, MD [facility] at [facility], [facility]   In 5 months Reubin Milan, MD [facility] at [facility], [facility]             OZEMPIC, 1 MG/DOSE, 4 MG/3ML SOPN [Pharmacy Med Name: OZEMPIC 4 MG/3 ML (1 MG/DOSE)]      Sig: INJECT 1MG  INTO THE SKIN ONCE A WEEK     Endocrinology:  Diabetes - GLP-1 Receptor Agonists - semaglutide Failed - 08/11/2023  7:45 AM      Failed - HBA1C in normal range and within 180 days    Hemoglobin A1C  Date Value Ref Range Status  06/26/2018 5.9  Final   Hgb A1c MFr Bld  Date Value Ref Range Status  05/02/2023 6.6 (H) 4.8 - 5.6 % Final    Comment:    (NOTE) Pre diabetes:          5.7%-6.4%  Diabetes:              >6.4%  Glycemic control  for   <7.0% adults with diabetes          Failed - Cr in normal range and within 360 days    Creatinine, Ser  Date Value Ref Range Status  05/02/2023 1.04 (H) 0.44 - 1.00 mg/dL Final         Passed - Valid encounter within last 6 months    Recent Outpatient Visits           1 month ago PMR (polymyalgia rheumatica) (HCC)   [place] [facility] at [facility], Nyoka Cowden, MD   3 months ago Diabetes mellitus treated with oral medication [facility])   [place] [facility] at [facility], Nyoka Cowden, MD   4 months ago PMR (polymyalgia rheumatica) [facility])   [place] [facility] at [facility], Nyoka Cowden, MD   4 months ago Ventral hernia without obstruction or gangrene   [place] [facility] at [facility], Nyoka Cowden, MD   5 months ago Major depression single episode, in partial remission [facility])   [place] [facility] at [facility], Nyoka Cowden, MD       Future Appointments             In 3 weeks Judithann Graves Nyoka Cowden, MD [facility] Primary Care & Sports Medicine at [facility], [facility]   In 5 months Judithann Graves, Nyoka Cowden, MD [facility] Primary Care & Sports Medicine at [facility], [facility]             cyclobenzaprine (FLEXERIL) 10 MG tablet [Pharmacy Med Name: CYCLOBENZAPRINE 10 MG TABLET] 90 tablet 0    Sig: TAKE 1 TABLET BY MOUTH THREE TIMES A DAY AS NEEDED FOR MUSCLE SPASM     Not Delegated - Analgesics:  Muscle Relaxants Failed - 08/11/2023  7:45 AM      Failed - This refill cannot be  delegated      Passed - Valid encounter within last 6 months    Recent Outpatient Visits           1 month ago PMR (polymyalgia rheumatica) (HCC)   [place] [facility] at [facility], Nyoka Cowden, MD   3 months ago Diabetes mellitus treated with oral medication [facility])   [place] [facility] at [facility], Nyoka Cowden, MD   4 months ago PMR (polymyalgia rheumatica) [facility])   [place] [facility] at [facility], Nyoka Cowden, MD   4 months ago Ventral hernia without obstruction or gangrene   [place] [facility] at [facility], Nyoka Cowden, MD   5 months ago Major depression single episode, in partial remission [facility])   [place] [facility] at [facility], Nyoka Cowden, MD       Future Appointments             In 3 weeks Judithann Graves Nyoka Cowden, MD [facility] Health Primary Care & Sports Medicine at [facility], [facility]   In 5 months Judithann Graves, Nyoka Cowden, MD [facility] Health Primary Care & Sports Medicine at [facility], [facility]

## 2023-09-02 ENCOUNTER — Ambulatory Visit: Payer: 59 | Admitting: Internal Medicine

## 2023-09-02 NOTE — Progress Notes (Deleted)
Date:  09/02/2023   Name:  Nancy Skinner   DOB:  24-Nov-1966   MRN:  [id number]   Chief Complaint: No chief complaint on file.  Diabetes She presents for her follow-up diabetic visit. She has type 2 diabetes mellitus.   PMR - last sed rate 19; dose was 10 mg - advised to take 5 mg daily x 4 weeks then 5 mg every other day.  Lab Results  Component Value Date   NA 136 05/02/2023   K 3.0 (L) 05/02/2023   CO2 24 05/02/2023   GLUCOSE 152 (H) 05/02/2023   BUN 14 05/02/2023   CREATININE 1.04 (H) 05/02/2023   CALCIUM 9.3 05/02/2023   EGFR 85 01/17/2023   GFRNONAA >60 05/02/2023   Lab Results  Component Value Date   CHOL 227 (H) 01/17/2023   HDL 39 (L) 01/17/2023   LDLCALC 96 01/17/2023   LDLDIRECT 48 09/02/2019   TRIG 552 (HH) 01/17/2023   CHOLHDL 5.8 (H) 01/17/2023   Lab Results  Component Value Date   TSH 2.380 01/17/2023   Lab Results  Component Value Date   HGBA1C 6.6 (H) 05/02/2023   Lab Results  Component Value Date   WBC 9.5 01/17/2023   HGB 12.7 01/17/2023   HCT 37.8 01/17/2023   MCV 86 01/17/2023   PLT 341 01/17/2023   Lab Results  Component Value Date   ALT 34 02/17/2023   AST 35 02/17/2023   ALKPHOS 71 02/17/2023   BILITOT 0.1 (L) 02/17/2023   Lab Results  Component Value Date   ESRSEDRATE 19 06/24/2023    No results found for: "25OHVITD2", "25OHVITD3", "VD25OH"   Review of Systems  Patient Active Problem List   Diagnosis Date Noted   BMI 34.0-34.9,adult 03/05/2023   Type II diabetes mellitus with complication (HCC) 03/05/2023   Current moderate episode of major depressive disorder without prior episode (HCC) 03/05/2023   PMR (polymyalgia rheumatica) (HCC) 02/26/2023   Status post hysterectomy 03/05/2022   Essential hypertension 04/16/2021   Hepatic steatosis 03/19/2021   Carpal tunnel syndrome of right wrist 07/10/2020   Screening for colon cancer    Coronary artery disease of native artery of native heart with stable angina pectoris  (HCC) 01/18/2019   Major depression single episode, in partial remission (HCC) 01/18/2019   Neutrophilic leukocytosis 08/09/2018   Pulmonary nodule less than 6 cm determined by computed tomography of lung 07/30/2018   Night sweats 07/16/2018   Neck muscle spasm 07/15/2018   Mixed hyperlipidemia 07/15/2018   Tobacco use disorder, moderate, in sustained remission 07/15/2018   Migraine without aura and without status migrainosus, not intractable 06/24/2018   Gastroesophageal reflux disease without esophagitis 06/24/2018   Chronic midline low back pain without sciatica 06/24/2018    Allergies  Allergen Reactions   Oxycodone Other (See Comments)    "Black outs" and confusion   Dye Fdc Red [Red Dye #40 (Allura Red)] Other (See Comments)    Severe migraine    Past Surgical History:  Procedure Laterality Date   ABDOMINAL HYSTERECTOMY  2012   partial for bleeding   BACK SURGERY     BREAST BIOPSY Right    neg   BREAST BIOPSY Left 2015   4 bxs- neg   BREAST SURGERY Left    multiple biopsies   CARDIAC CATHETERIZATION     CATARACT EXTRACTION Right 12/19/2010   CATARACT EXTRACTION Left    CHOLECYSTECTOMY  1999   COLONOSCOPY WITH PROPOFOL N/A 03/31/2020   Procedure: COLONOSCOPY WITH  PROPOFOL;  Surgeon: Midge Minium, MD;  Location[facility] END[facility]COPY;  Service: Endoscopy;  Laterality: N/A;   CORONARY PRESSURE/FFR STUDY N/A 07/22/2018   Procedure: INTRAVASCULAR PRESSURE WIRE/FFR STUDY;  Surgeon: Yvonne Kendall, MD;  Location: [facility] INVASIVE CV LAB;  Service: Cardiovascular;  Laterality: N/A;   EYE SURGERY     INSERTION OF MESH  03/27/2023   Procedure: INSERTION OF MESH;  Surgeon: Leafy Ro, MD;  Location: [facility] ORS;  Service: General;;   LEFT HEART CATH AND CORONARY ANGIOGRAPHY N/A 07/22/2018   Procedure: LEFT HEART CATH AND CORONARY ANGIOGRAPHY;  Surgeon: Yvonne Kendall, MD;  Location: [facility] INVASIVE CV LAB;  Service: Cardiovascular;  Laterality: N/A;   POSTERIOR LUMBAR FUSION 4 LEVEL   2000   l 4-5 fusion   VENTRAL HERNIA REPAIR N/A 03/27/2023   Procedure: HERNIA REPAIR VENTRAL ADULT, open;  Surgeon: Leafy Ro, MD;  Location: [facility] ORS;  Service: General;  Laterality: N/A;    Social History   Tobacco Use   Smoking status: Former    Current packs/day: 0.00    Average packs/day: 0.3 packs/day for 40.0 years (10.0 ttl pk-yrs)    Types: Cigarettes    Start date: 09/18/1979    Quit date: 09/18/2019    Years since quitting: 3.9    Passive exposure: Past   Smokeless tobacco: Never  Vaping Use   Vaping status: Never Used  Substance Use Topics   Alcohol use: Not Currently    Comment: less than once a week.   Drug use: Never     Medication list has been reviewed and updated.  No outpatient medications have been marked as taking for the 09/02/23 encounter (Appointment) with Reubin Milan, MD.       06/24/2023    2:25 PM 05/02/2023    2:58 PM 03/21/2023   10:36 AM 03/05/2023    1:50 PM  GAD 7 : Generalized Anxiety Score  Nervous, Anxious, on Edge 0 2 3 1   Control/stop worrying 0 2 3 2   Worry too much - different things 0 2 0 2  Trouble relaxing 0 3 2 2   Restless 0 3 0 1  Easily annoyed or irritable 0 3 3 3   Afraid - awful might happen 0 0 0 1  Total GAD 7 Score 0 15 11 12   Anxiety Difficulty Not difficult at all Somewhat difficult Somewhat difficult Somewhat difficult       06/24/2023    2:25 PM 05/02/2023    2:54 PM 03/21/2023   10:36 AM  Depression screen PHQ 2/9  Decreased Interest 0 2 1  Down, Depressed, Hopeless 0 2 2  PHQ - 2 Score 0 4 3  Altered sleeping 0 3 2  Tired, decreased energy 0 3 2  Change in appetite 0 1 3  Feeling bad or failure about yourself  0 2 1  Trouble concentrating 0 0 0  Moving slowly or fidgety/restless 0 0 0  Suicidal thoughts 0  0  PHQ-9 Score 0 13 11  Difficult doing work/chores Not difficult at all Somewhat difficult Not difficult at all    BP Readings from Last 3 Encounters:  06/24/23 118/76  05/02/23 120/78   04/02/23 128/72    Physical Exam  Wt Readings from Last 3 Encounters:  06/24/23 182 lb (82.6 kg)  05/02/23 182 lb (82.6 kg)  04/02/23 190 lb (86.2 kg)    There were no vitals taken for this visit.  Assessment and Plan:  Problem List Items Addressed This Visit  Unprioritized   Type II diabetes mellitus with complication (HCC) - Primary    Blood sugars stable without hypoglycemic symptoms or events. Current regimen is metformin and Ozempic. Changes made last visit are none. Lab Results  Component Value Date   HGBA1C 6.6 (H) 05/02/2023         PMR (polymyalgia rheumatica) (HCC) (Chronic)    Last sed rate 10. Now on prednisone  Will repeat sed rate and taper/dc prednisone      Other Visit Diagnoses     Long term current use of oral hypoglycemic drug           No follow-ups on file.    Reubin Milan, MD [facility] Primary Care and [facility] Medicine [place]

## 2023-09-02 NOTE — Assessment & Plan Note (Deleted)
Blood sugars stable without hypoglycemic symptoms or events. Current regimen is metformin and Ozempic. Changes made last visit are none. Lab Results  Component Value Date   HGBA1C 6.6 (H) 05/02/2023

## 2023-09-02 NOTE — Assessment & Plan Note (Deleted)
Last sed rate 10. Now on prednisone  Will repeat sed rate and taper/dc prednisone

## 2023-09-10 ENCOUNTER — Other Ambulatory Visit: Payer: Self-pay | Admitting: Internal Medicine

## 2023-09-10 DIAGNOSIS — G8929 Other chronic pain: Secondary | ICD-10-CM

## 2023-09-10 DIAGNOSIS — E119 Type 2 diabetes mellitus without complications: Secondary | ICD-10-CM

## 2023-09-18 ENCOUNTER — Other Ambulatory Visit: Payer: Self-pay | Admitting: Internal Medicine

## 2023-09-18 DIAGNOSIS — G43009 Migraine without aura, not intractable, without status migrainosus: Secondary | ICD-10-CM

## 2023-09-19 ENCOUNTER — Other Ambulatory Visit: Payer: Self-pay | Admitting: Internal Medicine

## 2023-09-19 DIAGNOSIS — F324 Major depressive disorder, single episode, in partial remission: Secondary | ICD-10-CM

## 2023-09-22 ENCOUNTER — Telehealth: Payer: Self-pay

## 2023-09-22 NOTE — Telephone Encounter (Signed)
Patient requesting refill for clonazepam.  Please advise.

## 2023-09-22 NOTE — Telephone Encounter (Signed)
Please review.  KP

## 2023-09-22 NOTE — Telephone Encounter (Signed)
Requested medications are due for refill today.  yes  Requested medications are on the active medications list.  yes  Last refill. 05/02/2023 #60 0 rf  Future visit scheduled.   yes  Notes to clinic.  Refill not delegated.    Requested Prescriptions  Pending Prescriptions Disp Refills   clonazePAM (KLONOPIN) 0.25 MG disintegrating tablet [Pharmacy Med Name: CLONAZEPAM 0.25 MG ODT] 60 tablet 0    Sig: TAKE 1 TABLET BY MOUTH 2 TIMES DAILY AS NEEDED.     Not Delegated - Psychiatry: Anxiolytics/Hypnotics 2 Failed - 09/19/2023  6:00 PM      Failed - This refill cannot be delegated      Failed - Urine Drug Screen completed in last 360 days      Passed - Patient is not pregnant      Passed - Valid encounter within last 6 months    Recent Outpatient Visits           3 months ago PMR (polymyalgia rheumatica) (HCC)   [place] [facility] at [facility], Nyoka Cowden, MD   4 months ago Diabetes mellitus treated with oral medication [facility])   [place] [facility] at [facility], Nyoka Cowden, MD   5 months ago PMR (polymyalgia rheumatica) [facility])   [place] [facility] at [facility], Nyoka Cowden, MD   6 months ago Ventral hernia without obstruction or gangrene   [place] [facility] at [facility], Nyoka Cowden, MD   6 months ago Major depression single episode, in partial remission [facility])   [place] [facility] Medicine at [facility], Nyoka Cowden, MD       Future Appointments             In 4 months Judithann Graves, Nyoka Cowden, MD [facility] Health Primary Care & Sports Medicine at [facility], [facility]

## 2023-10-07 ENCOUNTER — Other Ambulatory Visit: Payer: Self-pay | Admitting: Internal Medicine

## 2023-10-07 DIAGNOSIS — G8929 Other chronic pain: Secondary | ICD-10-CM

## 2023-10-12 ENCOUNTER — Other Ambulatory Visit: Payer: Self-pay | Admitting: Internal Medicine

## 2023-10-12 DIAGNOSIS — E119 Type 2 diabetes mellitus without complications: Secondary | ICD-10-CM

## 2023-10-14 ENCOUNTER — Telehealth: Payer: Self-pay | Admitting: Internal Medicine

## 2023-10-14 ENCOUNTER — Other Ambulatory Visit: Payer: Self-pay | Admitting: Internal Medicine

## 2023-10-14 DIAGNOSIS — F324 Major depressive disorder, single episode, in partial remission: Secondary | ICD-10-CM

## 2023-10-14 NOTE — Telephone Encounter (Signed)
Medication Refill - Medication: clonazePAM (KLONOPIN) 0.25 MG disintegrating tablet   Has the patient contacted their pharmacy? Yes.   (Agent: If no, request that the patient contact the pharmacy for the refill. If patient does not wish to contact the pharmacy document the reason why and proceed with request.) (Agent: If yes, when and what did the pharmacy advise?)  Preferred Pharmacy (with phone number or street name):  [facility] [phone] [name], [place] - [address]  [address] [place] [address]  Phone: [phone] Fax: [phone]   Has the patient been seen for an appointment in the last year OR does the patient have an upcoming appointment? Yes.    Agent: Please be advised that RX refills may take up to 3 business days. We ask that you follow-up with your pharmacy.

## 2023-10-14 NOTE — Telephone Encounter (Signed)
Requested Prescriptions  Pending Prescriptions Disp Refills   Semaglutide, 1 MG/DOSE, (OZEMPIC, 1 MG/DOSE,) 4 MG/3ML SOPN [Pharmacy Med Name: OZEMPIC 4 MG/3 ML (1 MG/DOSE)] 3 mL 0    Sig: INJECT 1 MG INTO THE SKIN ONE TIME PER WEEK     Endocrinology:  Diabetes - GLP-1 Receptor Agonists - semaglutide Failed - 10/12/2023  1:35 PM      Failed - HBA1C in normal range and within 180 days    Hemoglobin A1C  Date Value Ref Range Status  06/26/2018 5.9  Final   Hgb A1c MFr Bld  Date Value Ref Range Status  05/02/2023 6.6 (H) 4.8 - 5.6 % Final    Comment:    (NOTE) Pre diabetes:          5.7%-6.4%  Diabetes:              >6.4%  Glycemic control for   <7.0% adults with diabetes          Failed - Cr in normal range and within 360 days    Creatinine, Ser  Date Value Ref Range Status  05/02/2023 1.04 (H) 0.44 - 1.00 mg/dL Final         Passed - Valid encounter within last 6 months    Recent Outpatient Visits           3 months ago PMR (polymyalgia rheumatica) (HCC)   [place] [facility] at [facility], Nyoka Cowden, MD   5 months ago Diabetes mellitus treated with oral medication [facility])   [place] [facility] at [facility], Nyoka Cowden, MD   6 months ago PMR (polymyalgia rheumatica) [facility])   [place] [facility] Sports Medicine at [facility], Nyoka Cowden, MD   6 months ago Ventral hernia without obstruction or gangrene   [place] [facility] Medicine at [facility], Nyoka Cowden, MD   7 months ago Major depression single episode, in partial remission [facility])   [place] Primary Care & Sports Medicine at [facility], Nyoka Cowden, MD       Future Appointments             In 3 months Judithann Graves, Nyoka Cowden, MD [facility] Health Primary Care & Sports Medicine at [facility], [facility]

## 2023-10-14 NOTE — Telephone Encounter (Signed)
Copied from CRM [id number]. Topic: Appointment Scheduling - Scheduling Inquiry for Clinic >> Oct 14, 2023 12:01 PM Franchot Heidelberg wrote: Reason for CRM: Pt returned call for scheduling, says she wants to go downstairs for labs only. Needs orders in chart  Per

## 2023-10-15 NOTE — Telephone Encounter (Signed)
Requested medication (s) are due for refill today: yes  Requested medication (s) are on the active medication list: yes  Last refill:    Future visit scheduled: yes  Notes to clinic:  Unable to refill per protocol, cannot delegate.      Requested Prescriptions  Pending Prescriptions Disp Refills   clonazePAM (KLONOPIN) 0.25 MG disintegrating tablet 60 tablet 0    Sig: Take 1 tablet (0.25 mg total) by mouth 2 (two) times daily as needed.     Not Delegated - Psychiatry: Anxiolytics/Hypnotics 2 Failed - 10/14/2023  1:33 PM      Failed - This refill cannot be delegated      Failed - Urine Drug Screen completed in last 360 days      Passed - Patient is not pregnant      Passed - Valid encounter within last 6 months    Recent Outpatient Visits           3 months ago PMR (polymyalgia rheumatica) (HCC)   [place] [facility] at [facility], Nyoka Cowden, MD   5 months ago Diabetes mellitus treated with oral medication [facility])   [place] Primary Care & Sports Medicine at [facility], Nyoka Cowden, MD   6 months ago PMR (polymyalgia rheumatica) [facility])   [place] Primary Care & Sports Medicine at [facility], Nyoka Cowden, MD   6 months ago Ventral hernia without obstruction or gangrene   [place] Primary Care & Sports Medicine at [facility], Nyoka Cowden, MD   7 months ago Major depression single episode, in partial remission [facility])   [place] Primary Care & Sports Medicine at [facility], Nyoka Cowden, MD       Future Appointments             In 3 months Judithann Graves, Nyoka Cowden, MD [facility] Health Primary Care & Sports Medicine at [facility], [facility]

## 2023-10-16 MED ORDER — CLONAZEPAM 0.25 MG PO TBDP
0.2500 mg | ORAL_TABLET | Freq: Two times a day (BID) | ORAL | 0 refills | Status: DC | PRN
Start: 2023-10-16 — End: 2023-12-16

## 2023-11-08 ENCOUNTER — Other Ambulatory Visit: Payer: Self-pay | Admitting: Internal Medicine

## 2023-11-08 DIAGNOSIS — G8929 Other chronic pain: Secondary | ICD-10-CM

## 2023-11-11 NOTE — Telephone Encounter (Signed)
Requested medication (s) are due for refill today: yes  Requested medication (s) are on the active medication list: yes    Last refill: 10/08/23  #90  0 refills  Future visit scheduled yes 11/12/23  Notes to clinic:Not delegated, please review.  Requested Prescriptions  Pending Prescriptions Disp Refills   cyclobenzaprine (FLEXERIL) 10 MG tablet [Pharmacy Med Name: CYCLOBENZAPRINE 10 MG TABLET] 90 tablet 0    Sig: TAKE 1 TABLET BY MOUTH THREE TIMES A DAY AS NEEDED FOR MUSCLE SPASM     Not Delegated - Analgesics:  Muscle Relaxants Failed - 11/08/2023  3:20 PM      Failed - This refill cannot be delegated      Passed - Valid encounter within last 6 months    Recent Outpatient Visits           4 months ago PMR (polymyalgia rheumatica) (HCC)   [place] [facility] Care [facility] Medicine at [facility], Nyoka Cowden, MD   6 months ago Diabetes mellitus treated with oral medication [facility])   [place] Primary Care & Sports Medicine at [facility], Nyoka Cowden, MD   7 months ago PMR (polymyalgia rheumatica) [facility])   [place] Primary Care & Sports Medicine at [facility], Nyoka Cowden, MD   7 months ago Ventral hernia without obstruction or gangrene   [place] Primary Care & Sports Medicine at [facility], Nyoka Cowden, MD   8 months ago Major depression single episode, in partial remission [facility])   [place] Primary Care & Sports Medicine at [facility], Nyoka Cowden, MD       Future Appointments             Tomorrow Judithann Graves, Nyoka Cowden, MD [facility] Health Primary Care & Sports Medicine at [facility], [facility]   In 2 months End, Cristal Deer, MD [facility]   In 2 months Judithann Graves, Nyoka Cowden, MD [facility] Health Primary Care & Sports Medicine at [facility], [facility]

## 2023-11-12 ENCOUNTER — Ambulatory Visit: Payer: 59 | Admitting: Internal Medicine

## 2023-11-12 NOTE — Assessment & Plan Note (Deleted)
Clinically stable on Vraylar, Lexapro and Bupropion with good response, No SI or HI reported. No change in management at this time.

## 2023-11-12 NOTE — Progress Notes (Deleted)
Date:  11/12/2023   Name:  Nancy Skinner   DOB:  06-Feb-1966   MRN:  [id number]   Chief Complaint: No chief complaint on file.  Hypertension This is a chronic problem. The problem is controlled. Pertinent negatives include no chest pain, headaches, palpitations or shortness of breath. Past treatments include diuretics. Hypertensive end-organ damage includes CAD/MI. There is no history of kidney disease or CVA.  Diabetes She presents for her follow-up diabetic visit. She has type 2 diabetes mellitus. Her disease course has been stable. Pertinent negatives for hypoglycemia include no headaches, nervousness/anxiousness or tremors. Pertinent negatives for diabetes include no chest pain, no fatigue, no polydipsia and no polyuria. Pertinent negatives for diabetic complications include no CVA. Current diabetic treatments: metformin and Ozempic. She is compliant with treatment all of the time.  Depression        This is a chronic problem.The problem is unchanged.  Associated symptoms include myalgias.  Associated symptoms include no fatigue, no appetite change and no headaches.  Past treatments include SSRIs - Selective serotonin reuptake inhibitors (Vraylar and Bupropion). PMR -   Review of Systems  Constitutional:  Negative for appetite change, fatigue, fever and unexpected weight change.  HENT:  Negative for tinnitus and trouble swallowing.   Eyes:  Negative for visual disturbance.  Respiratory:  Negative for cough, chest tightness and shortness of breath.   Cardiovascular:  Negative for chest pain, palpitations and leg swelling.  Gastrointestinal:  Negative for abdominal pain.  Endocrine: Negative for polydipsia and polyuria.  Genitourinary:  Negative for dysuria and hematuria.  Musculoskeletal:  Positive for myalgias. Negative for arthralgias.  Neurological:  Negative for tremors, numbness and headaches.  Psychiatric/Behavioral:  Positive for depression. Negative for dysphoric mood and  sleep disturbance. The patient is not nervous/anxious.      Lab Results  Component Value Date   NA 136 05/02/2023   K 3.0 (L) 05/02/2023   CO2 24 05/02/2023   GLUCOSE 152 (H) 05/02/2023   BUN 14 05/02/2023   CREATININE 1.04 (H) 05/02/2023   CALCIUM 9.3 05/02/2023   EGFR 85 01/17/2023   GFRNONAA >60 05/02/2023   Lab Results  Component Value Date   CHOL 227 (H) 01/17/2023   HDL 39 (L) 01/17/2023   LDLCALC 96 01/17/2023   LDLDIRECT 48 09/02/2019   TRIG 552 (HH) 01/17/2023   CHOLHDL 5.8 (H) 01/17/2023   Lab Results  Component Value Date   TSH 2.380 01/17/2023   Lab Results  Component Value Date   HGBA1C 6.6 (H) 05/02/2023   Lab Results  Component Value Date   WBC 9.5 01/17/2023   HGB 12.7 01/17/2023   HCT 37.8 01/17/2023   MCV 86 01/17/2023   PLT 341 01/17/2023   Lab Results  Component Value Date   ALT 34 02/17/2023   AST 35 02/17/2023   ALKPHOS 71 02/17/2023   BILITOT 0.1 (L) 02/17/2023   No results found for: "25OHVITD2", "25OHVITD3", "VD25OH"   Patient Active Problem List   Diagnosis Date Noted   BMI 34.0-34.9,adult 03/05/2023   Type II diabetes mellitus with complication (HCC) 03/05/2023   Current moderate episode of major depressive disorder without prior episode (HCC) 03/05/2023   PMR (polymyalgia rheumatica) (HCC) 02/26/2023   Status post hysterectomy 03/05/2022   Essential hypertension 04/16/2021   Hepatic steatosis 03/19/2021   Carpal tunnel syndrome of right wrist 07/10/2020   Screening for colon cancer    Coronary artery disease of native artery of native heart with stable angina pectoris (  HCC) 01/18/2019   Major depression single episode, in partial remission (HCC) 01/18/2019   Neutrophilic leukocytosis 08/09/2018   Pulmonary nodule less than 6 cm determined by computed tomography of lung 07/30/2018   Night sweats 07/16/2018   Neck muscle spasm 07/15/2018   Mixed hyperlipidemia 07/15/2018   Tobacco use disorder, moderate, in sustained  remission 07/15/2018   Migraine without aura and without status migrainosus, not intractable 06/24/2018   Gastroesophageal reflux disease without esophagitis 06/24/2018   Chronic midline low back pain without sciatica 06/24/2018    Allergies  Allergen Reactions   Oxycodone Other (See Comments)    "Black outs" and confusion   Dye Fdc Red [Red Dye #40 (Allura Red)] Other (See Comments)    Severe migraine    Past Surgical History:  Procedure Laterality Date   ABDOMINAL HYSTERECTOMY  2012   partial for bleeding   BACK SURGERY     BREAST BIOPSY Right    neg   BREAST BIOPSY Left 2015   4 bxs- neg   BREAST SURGERY Left    multiple biopsies   CARDIAC CATHETERIZATION     CATARACT EXTRACTION Right 12/19/2010   CATARACT EXTRACTION Left    CHOLECYSTECTOMY  1999   COLONOSCOPY WITH PROPOFOL N/A 03/31/2020   Procedure: COLONOSCOPY WITH PROPOFOL;  Surgeon: Midge Minium, MD;  Location: [facility];  Service: Endoscopy;  Laterality: N/A;   CORONARY PRESSURE/FFR STUDY N/A 07/22/2018   Procedure: INTRAVASCULAR PRESSURE WIRE/FFR STUDY;  Surgeon: Yvonne Kendall, MD;  Location: [facility];  Service: Cardiovascular;  Laterality: N/A;   EYE SURGERY     INSERTION OF MESH  03/27/2023   Procedure: INSERTION OF MESH;  Surgeon: Leafy Ro, MD;  Location: [facility] OR[facility];  Service: General;;   LEFT HEART CATH AND CORONARY ANGIOGRAPHY N/A 07/22/2018   Procedure: LEFT HEART CATH AND CORONARY ANGIOGRAPHY;  Surgeon: Yvonne Kendall, MD;  Location: [facility];  Service: Cardiovascular;  Laterality: N/A;   POSTERIOR LUMBAR FUSION 4 LEVEL  2000   l 4-5 fusion   VENTRAL HERNIA REPAIR N/A 03/27/2023   Procedure: HERNIA REPAIR VENTRAL ADULT, open;  Surgeon: Leafy Ro, MD;  Location: [facility] ORS;  Service: General;  Laterality: N/A;    Social History   Tobacco Use   Smoking status: Former    Current packs/day: 0.00    Average packs/day: 0.3 packs/day for 40.0 years (10.0 ttl pk-yrs)     Types: Cigarettes    Start date: 09/18/1979    Quit date: 09/18/2019    Years since quitting: 4.1    Passive exposure: Past   Smokeless tobacco: Never  Vaping Use   Vaping status: Never Used  Substance Use Topics   Alcohol use: Not Currently    Comment: less than once a week.   Drug use: Never     Medication list has been reviewed and updated.  No outpatient medications have been marked as taking for the 11/12/23 encounter (Appointment) with Reubin Milan, MD.       06/24/2023    2:25 PM 05/02/2023    2:58 PM 03/21/2023   10:36 AM 03/05/2023    1:50 PM  GAD 7 : Generalized Anxiety Score  Nervous, Anxious, on Edge 0 2 3 1   Control/stop worrying 0 2 3 2   Worry too much - different things 0 2 0 2  Trouble relaxing 0 3 2 2   Restless 0 3 0 1  Easily annoyed or irritable 0 3 3 3   Afraid -  awful might happen 0 0 0 1  Total GAD 7 Score 0 15 11 12   Anxiety Difficulty Not difficult at all Somewhat difficult Somewhat difficult Somewhat difficult       06/24/2023    2:25 PM 05/02/2023    2:54 PM 03/21/2023   10:36 AM  Depression screen PHQ 2/9  Decreased Interest 0 2 1  Down, Depressed, Hopeless 0 2 2  PHQ - 2 Score 0 4 3  Altered sleeping 0 3 2  Tired, decreased energy 0 3 2  Change in appetite 0 1 3  Feeling bad or failure about yourself  0 2 1  Trouble concentrating 0 0 0  Moving slowly or fidgety/restless 0 0 0  Suicidal thoughts 0  0  PHQ-9 Score 0 13 11  Difficult doing work/chores Not difficult at all Somewhat difficult Not difficult at all    BP Readings from Last 3 Encounters:  06/24/23 118/76  05/02/23 120/78  04/02/23 128/72    Physical Exam  Wt Readings from Last 3 Encounters:  06/24/23 182 lb (82.6 kg)  05/02/23 182 lb (82.6 kg)  04/02/23 190 lb (86.2 kg)    There were no vitals taken for this visit.  Assessment and Plan:  Problem List Items Addressed This Visit       Unprioritized   Current moderate episode of major depressive disorder without  prior episode (HCC)    Clinically stable on Vraylar, Lexapro and Bupropion with good response, No SI or HI reported. No change in management at this time.       Essential hypertension - Primary (Chronic)    Controlled BP with normal exam. Current regimen is hydrochlorothiazide and PRN lasix. Will continue same medications; encourage continued reduced sodium diet.       PMR (polymyalgia rheumatica) (HCC) (Chronic)    Lab Results  Component Value Date   ESRSEDRATE 19 06/24/2023  Instructed to taper prednisone down to 5 mg per day until next visit. Will recheck ESR today.       Type II diabetes mellitus with complication (HCC)    Blood sugars stable without hypoglycemic symptoms or events. Currently managed with metformin and Ozempic Changes made last visit are resuming ozempic. Lab Results  Component Value Date   HGBA1C 6.6 (H) 05/02/2023          No follow-ups on file.    Reubin Milan, MD [facility] [place]

## 2023-11-12 NOTE — Assessment & Plan Note (Deleted)
Controlled BP with normal exam. Current regimen is hydrochlorothiazide and PRN lasix. Will continue same medications; encourage continued reduced sodium diet.

## 2023-11-12 NOTE — Assessment & Plan Note (Deleted)
Blood sugars stable without hypoglycemic symptoms or events. Currently managed with metformin and Ozempic Changes made last visit are resuming ozempic. Lab Results  Component Value Date   HGBA1C 6.6 (H) 05/02/2023

## 2023-11-12 NOTE — Assessment & Plan Note (Deleted)
Lab Results  Component Value Date   ESRSEDRATE 19 06/24/2023  Instructed to taper prednisone down to 5 mg per day until next visit. Will recheck ESR today.

## 2023-12-02 ENCOUNTER — Ambulatory Visit: Payer: 59 | Admitting: Internal Medicine

## 2023-12-11 ENCOUNTER — Other Ambulatory Visit: Payer: Self-pay | Admitting: Internal Medicine

## 2023-12-11 DIAGNOSIS — M545 Low back pain, unspecified: Secondary | ICD-10-CM

## 2023-12-16 ENCOUNTER — Other Ambulatory Visit
Admission: RE | Admit: 2023-12-16 | Discharge: 2023-12-16 | Disposition: A | Discharge status: Home / Self Care | Payer: 59 | Attending: Internal Medicine | Admitting: Internal Medicine

## 2023-12-16 ENCOUNTER — Encounter: Payer: Self-pay | Admitting: Internal Medicine

## 2023-12-16 ENCOUNTER — Ambulatory Visit (INDEPENDENT_AMBULATORY_CARE_PROVIDER_SITE_OTHER): Payer: 59 | Admitting: Internal Medicine

## 2023-12-16 VITALS — BP 128/72 | HR 72 | Ht 63.0 in | Wt 186.0 lb

## 2023-12-16 DIAGNOSIS — I10 Essential (primary) hypertension: Secondary | ICD-10-CM | POA: Diagnosis present

## 2023-12-16 DIAGNOSIS — E7849 Other hyperlipidemia: Secondary | ICD-10-CM | POA: Diagnosis present

## 2023-12-16 DIAGNOSIS — E1169 Type 2 diabetes mellitus with other specified complication: Secondary | ICD-10-CM | POA: Diagnosis not present

## 2023-12-16 DIAGNOSIS — E118 Type 2 diabetes mellitus with unspecified complications: Secondary | ICD-10-CM | POA: Diagnosis not present

## 2023-12-16 DIAGNOSIS — M353 Polymyalgia rheumatica: Secondary | ICD-10-CM | POA: Diagnosis not present

## 2023-12-16 DIAGNOSIS — F324 Major depressive disorder, single episode, in partial remission: Secondary | ICD-10-CM

## 2023-12-16 DIAGNOSIS — F321 Major depressive disorder, single episode, moderate: Secondary | ICD-10-CM

## 2023-12-16 DIAGNOSIS — Z7984 Long term (current) use of oral hypoglycemic drugs: Secondary | ICD-10-CM

## 2023-12-16 DIAGNOSIS — E785 Hyperlipidemia, unspecified: Secondary | ICD-10-CM

## 2023-12-16 LAB — COMPREHENSIVE METABOLIC PANEL
ALT: 23 U/L (ref 0–44)
AST: 28 U/L (ref 15–41)
Albumin: 4.3 g/dL (ref 3.5–5.0)
Alkaline Phosphatase: 34 U/L — ABNORMAL LOW (ref 38–126)
Anion gap: 10 (ref 5–15)
BUN: 14 mg/dL (ref 6–20)
CO2: 24 mmol/L (ref 22–32)
Calcium: 9.1 mg/dL (ref 8.9–10.3)
Chloride: 101 mmol/L (ref 98–111)
Creatinine, Ser: 0.91 mg/dL (ref 0.44–1.00)
GFR, Estimated: >60 mL/min (ref >60)
Glucose, Bld: 104 mg/dL — ABNORMAL HIGH (ref 70–99)
Potassium: 3.7 mmol/L (ref 3.5–5.1)
Sodium: 135 mmol/L (ref 135–145)
Total Bilirubin: 0.2 mg/dL (ref 0.0–1.2)
Total Protein: 7.8 g/dL (ref 6.5–8.1)

## 2023-12-16 LAB — LIPID PANEL
Cholesterol: 215 mg/dL — ABNORMAL HIGH (ref 0–200)
HDL: 43 mg/dL (ref >40)
LDL Cholesterol: 104 mg/dL — ABNORMAL HIGH (ref 0–99)
Total CHOL/HDL Ratio: 5 {ratio}
Triglycerides: 340 mg/dL — ABNORMAL HIGH (ref <150)
VLDL: 68 mg/dL — ABNORMAL HIGH (ref 0–40)

## 2023-12-16 LAB — SEDIMENTATION RATE: Sed Rate: 15 mm/h (ref 0–30)

## 2023-12-16 MED ORDER — CLONAZEPAM 0.25 MG PO TBDP: 0.2500 mg | ORAL_TABLET | Freq: Two times a day (BID) | ORAL | 0 refills | Status: DC | PRN

## 2023-12-16 MED ORDER — TIRZEPATIDE 2.5 MG/0.5ML ~~LOC~~ SOAJ: 2.5000 mg | SUBCUTANEOUS | 0 refills | Status: DC

## 2023-12-16 MED ORDER — ROSUVASTATIN CALCIUM 40 MG PO TABS: 40.0000 mg | ORAL_TABLET | Freq: Every day | ORAL | 1 refills | Status: DC

## 2023-12-16 NOTE — Assessment & Plan Note (Signed)
Doing well - tapered off of prednisone. Will recheck ESR

## 2023-12-16 NOTE — Assessment & Plan Note (Signed)
Depression currently active due to holiday stresses, weight gain, etc Will continue current regimen for now.

## 2023-12-16 NOTE — Assessment & Plan Note (Signed)
 Controlled BP with normal exam. Current regimen is hydrochlorothiazide and lasix 10 mg. Will continue same medications; encourage continued reduced sodium diet.

## 2023-12-16 NOTE — Telephone Encounter (Signed)
 Requested medication (s) are due for refill today: yes  Requested medication (s) are on the active medication list: yes    Last refill: 11/12/23  #90  0 refills  Future visit scheduled yes 01/28/24  Notes to clinic:Not delegated, please review.  Requested Prescriptions  Pending Prescriptions Disp Refills   cyclobenzaprine  (FLEXERIL ) 10 MG tablet [Pharmacy Med Name: CYCLOBENZAPRINE  10 MG TABLET] 90 tablet 0    Sig: TAKE 1 TABLET BY MOUTH THREE TIMES A DAY AS NEEDED FOR MUSCLE SPASM     Not Delegated - Analgesics:  Muscle Relaxants Failed - 12/16/2023 12:41 PM      Failed - This refill cannot be delegated      Passed - Valid encounter within last 6 months    Recent Outpatient Visits           Today Essential hypertension   [place] [facility] at [facility], Leita DEL, MD   5 months ago PMR (polymyalgia rheumatica) [facility])   [place] [facility] at [facility], Leita DEL, MD   7 months ago Diabetes mellitus treated with oral medication [facility])   [place] [facility] at [facility], Leita DEL, MD   8 months ago PMR (polymyalgia rheumatica) [facility]   [place] [facility] at [facility], Leita DEL, MD   9 months ago Ventral hernia without obstruction or gangrene   [facility] at [facility], Leita DEL, MD       Future Appointments             In 1 month End, Lonni, MD [facility] at [place]   In 1 month Justus, Leita DEL, MD [facility] at [facility]   In 1 month Justus, Leita DEL, MD [facility] at [facility], [facility]

## 2023-12-16 NOTE — Assessment & Plan Note (Addendum)
 Blood sugars stable without hypoglycemic symptoms or events. Currently managed with MTF and Ozempic . Changes made last visit are none.  She feels that Ozempic  is not controlling her appetite.  Will change to Mounjaro  and titrate up monthly. Lab Results  Component Value Date   HGBA1C 6.6 (H) 05/02/2023

## 2023-12-16 NOTE — Assessment & Plan Note (Signed)
 LDL is  Lab Results  Component Value Date   LDLCALC 96 01/17/2023   Current regimen is Crestor and Fenofibrate.  Tolerating medications well without issues.

## 2023-12-16 NOTE — Progress Notes (Signed)
 Date:  12/16/2023   Name:  Nancy Skinner   DOB:  Jul 02, 1966   MRN:  [id number]   Chief Complaint: Diabetes and PMR  Hypertension This is a chronic problem. The problem is controlled. Pertinent negatives include no chest pain, headaches, palpitations or shortness of breath. Past treatments include diuretics. Hypertensive end-organ damage includes CAD/MI. There is no history of kidney disease or CVA.  Diabetes She presents for her follow-up diabetic visit. She has type 2 diabetes mellitus. Her disease course has been stable. Hypoglycemia symptoms include nervousness/anxiousness. Pertinent negatives for hypoglycemia include no dizziness or headaches. Pertinent negatives for diabetes include no chest pain, no fatigue and no weakness. Pertinent negatives for diabetic complications include no CVA. Current diabetic treatments: MTF and Ozempic .  Depression        This is a chronic problem.  The problem has been gradually worsening (did not have a great Christmas and very down about her weight gain) since onset.  Associated symptoms include no fatigue and no headaches.  Compliance with treatment is good. Hyperlipidemia This is a chronic problem. Recent lipid tests were reviewed and are high. Pertinent negatives include no chest pain or shortness of breath. Current antihyperlipidemic treatment includes statins and fibric acid derivatives.    Review of Systems  Constitutional:  Negative for fatigue and unexpected weight change.  HENT:  Negative for nosebleeds and trouble swallowing.   Eyes:  Negative for visual disturbance.  Respiratory:  Negative for cough, chest tightness, shortness of breath and wheezing.   Cardiovascular:  Negative for chest pain, palpitations and leg swelling.  Gastrointestinal:  Negative for abdominal pain, constipation and diarrhea.  Musculoskeletal:  Negative for arthralgias and gait problem.  Neurological:  Negative for dizziness, weakness, light-headedness and  headaches.  Psychiatric/Behavioral:  Positive for depression, dysphoric mood and sleep disturbance. The patient is nervous/anxious.      Lab Results  Component Value Date   NA 136 05/02/2023   K 3.0 (L) 05/02/2023   CO2 24 05/02/2023   GLUCOSE 152 (H) 05/02/2023   BUN 14 05/02/2023   CREATININE 1.04 (H) 05/02/2023   CALCIUM  9.3 05/02/2023   EGFR 85 01/17/2023   GFRNONAA >60 05/02/2023   Lab Results  Component Value Date   CHOL 227 (H) 01/17/2023   HDL 39 (L) 01/17/2023   LDLCALC 96 01/17/2023   LDLDIRECT 48 09/02/2019   TRIG 552 (HH) 01/17/2023   CHOLHDL 5.8 (H) 01/17/2023   Lab Results  Component Value Date   TSH 2.380 01/17/2023   Lab Results  Component Value Date   HGBA1C 6.6 (H) 05/02/2023   Lab Results  Component Value Date   WBC 9.5 01/17/2023   HGB 12.7 01/17/2023   HCT 37.8 01/17/2023   MCV 86 01/17/2023   PLT 341 01/17/2023   Lab Results  Component Value Date   ALT 34 02/17/2023   AST 35 02/17/2023   ALKPHOS 71 02/17/2023   BILITOT 0.1 (L) 02/17/2023   No results found for: MARIEN BOLLS, VD25OH   Patient Active Problem List   Diagnosis Date Noted   BMI 34.0-34.9,adult 03/05/2023   Type II diabetes mellitus with complication (HCC) 03/05/2023   Current moderate episode of major depressive disorder without prior episode (HCC) 03/05/2023   PMR (polymyalgia rheumatica) (HCC) 02/26/2023   Status post hysterectomy 03/05/2022   Essential hypertension 04/16/2021   Hepatic steatosis 03/19/2021   Carpal tunnel syndrome of right wrist 07/10/2020   Coronary artery disease of native artery of native heart with  stable angina pectoris (HCC) 01/18/2019   Major depression single episode, in partial remission (HCC) 01/18/2019   Neutrophilic leukocytosis 08/09/2018   Pulmonary nodule less than 6 cm determined by computed tomography of lung 07/30/2018   Night sweats 07/16/2018   Neck muscle spasm 07/15/2018   Hyperlipidemia associated with type 2  diabetes mellitus (HCC) 07/15/2018   Tobacco use disorder, moderate, in sustained remission 07/15/2018   Migraine without aura and without status migrainosus, not intractable 06/24/2018   Gastroesophageal reflux disease without esophagitis 06/24/2018   Chronic midline low back pain without sciatica 06/24/2018    Allergies  Allergen Reactions   Oxycodone Other (See Comments)    Black outs and confusion   Dye Fdc Red [Red Dye #40 (Allura Red)] Other (See Comments)    Severe migraine    Past Surgical History:  Procedure Laterality Date   ABDOMINAL HYSTERECTOMY  2012   partial for bleeding   BACK SURGERY     BREAST BIOPSY Right    neg   BREAST BIOPSY Left 2015   4 bxs- neg   BREAST SURGERY Left    multiple biopsies   CARDIAC CATHETERIZATION     CATARACT EXTRACTION Right 12/19/2010   CATARACT EXTRACTION Left    CHOLECYSTECTOMY  1999   COLONOSCOPY WITH PROPOFOL  N/A 03/31/2020   Procedure: COLONOSCOPY WITH PROPOFOL ;  Surgeon: Jinny Carmine, MD;  Location: [facility];  Service: Endoscopy;  Laterality: N/A;   CORONARY PRESSURE/FFR STUDY N/A 07/22/2018   Procedure: INTRAVASCULAR PRESSURE WIRE/FFR STUDY;  Surgeon: Mady Bruckner, MD;  Location: [facility];  Service: Cardiovascular;  Laterality: N/A;   EYE SURGERY     INSERTION OF MESH  03/27/2023   Procedure: INSERTION OF MESH;  Surgeon: Jordis Laneta FALCON, MD;  Location: [facility] OR[facility];  Service: General;;   LEFT HEART CATH AND CORONARY ANGIOGRAPHY N/A 07/22/2018   Procedure: LEFT HEART CATH AND CORONARY ANGIOGRAPHY;  Surgeon: Mady Bruckner, MD;  Location: [facility];  Service: Cardiovascular;  Laterality: N/A;   POSTERIOR LUMBAR FUSION 4 LEVEL  2000   l 4-5 fusion   VENTRAL HERNIA REPAIR N/A 03/27/2023   Procedure: HERNIA REPAIR VENTRAL ADULT, open;  Surgeon: Jordis Laneta FALCON, MD;  Location: [facility] OR[facility];  Service: General;  Laterality: N/A;    Social History   Tobacco Use   Smoking status: Former    Current  packs/day: 0.00    Average packs/day: 0.3 packs/day for 40.0 years (10.0 ttl pk-yrs)    Types: Cigarettes    Start date: 09/18/1979    Quit date: 09/18/2019    Years since quitting: 4.2    Passive exposure: Past   Smokeless tobacco: Never  Vaping Use   Vaping status: Never Used  Substance Use Topics   Alcohol use: Not Currently    Comment: less than once a week.   Drug use: Never     Medication list has been reviewed and updated.  Current Meds  Medication Sig   albuterol  (VENTOLIN  HFA) 108 (90 Base) MCG/ACT inhaler INHALE 2 PUFFS INTO THE LUNGS EVERY 4 HOURS AS NEEDED FOR WHEEZE OR FOR SHORTNESS OF BREATH   aspirin  EC 81 MG tablet Take 81 mg by mouth daily.   baclofen  (LIORESAL ) 10 MG tablet Take 1 tablet (10 mg total) by mouth 3 (three) times daily. (Patient taking differently: Take 10 mg by mouth 3 (three) times daily as needed for muscle spasms.)   buPROPion  (WELLBUTRIN  XL) 300 MG 24 hr tablet TAKE 1 TABLET BY MOUTH  EVERY DAY   cariprazine  (VRAYLAR ) 1.5 MG capsule TAKE 1 CAPSULE BY MOUTH EVERY DAY   clotrimazole -betamethasone  (LOTRISONE ) cream APPLY 1 APPLICATION TOPICALLY 2 (TWO) TIMES DAILY. TO RASH ON LEGS (Patient taking differently: Apply 1 application  topically 2 (two) times daily as needed. To rash on legs)   cyclobenzaprine  (FLEXERIL ) 10 MG tablet TAKE 1 TABLET BY MOUTH THREE TIMES A DAY AS NEEDED FOR MUSCLE SPASM   escitalopram  (LEXAPRO ) 20 MG tablet TAKE 1 TABLET BY MOUTH EVERY DAY   Esomeprazole Magnesium (NEXIUM PO) Take 20 mg by mouth daily.    fenofibrate  (TRICOR ) 145 MG tablet TAKE 1 TABLET BY MOUTH EVERY DAY   furosemide  (LASIX ) 20 MG tablet TAKE 1 TABLET BY MOUTH EVERY DAY   hydrochlorothiazide  (HYDRODIURIL ) 25 MG tablet Take 1 tablet (25 mg total) by mouth daily.   ibuprofen  (ADVIL ) 600 MG tablet Take 1 tablet (600 mg total) by mouth every 6 (six) hours as needed.   KLOR-CON  M10 10 MEQ tablet TAKE 1 TABLET BY MOUTH EVERY DAY   Lidocaine  4 % PTCH Apply 1 patch  topically daily as needed (pain).    metFORMIN  (GLUCOPHAGE ) 500 MG tablet TAKE 1 TABLET BY MOUTH TWICE A DAY WITH FOOD   Multiple Vitamin (MULTIVITAMIN) capsule Take 1 capsule by mouth daily.   ondansetron  (ZOFRAN -ODT) 4 MG disintegrating tablet Take 1 tablet (4 mg total) by mouth every 8 (eight) hours as needed for nausea or vomiting.   SUMAtriptan  (IMITREX ) 100 MG tablet TAKE 1 TABLET (100 MG TOTAL) BY MOUTH AS NEEDED FOR MIGRAINE. MAY REPEAT IN 2 HOURS IF NEEDED   tirzepatide  (MOUNJARO ) 2.5 MG/0.5ML Pen Inject 2.5 mg into the skin once a week.   triamcinolone  (KENALOG ) 0.025 % ointment Apply 1 application topically as needed. For rash   [DISCONTINUED] clonazePAM  (KLONOPIN ) 0.25 MG disintegrating tablet Take 1 tablet (0.25 mg total) by mouth 2 (two) times daily as needed.   [DISCONTINUED] rosuvastatin  (CRESTOR ) 40 MG tablet TAKE 1 TABLET BY MOUTH EVERY DAY   [DISCONTINUED] Semaglutide , 1 MG/DOSE, (OZEMPIC , 1 MG/DOSE,) 4 MG/3ML SOPN INJECT 1 MG INTO THE SKIN ONE TIME PER WEEK       12/16/2023   11:26 AM 06/24/2023    2:25 PM 05/02/2023    2:58 PM 03/21/2023   10:36 AM  GAD 7 : Generalized Anxiety Score  Nervous, Anxious, on Edge 3 0 2 3  Control/stop worrying 3 0 2 3  Worry too much - different things 3 0 2 0  Trouble relaxing 3 0 3 2  Restless 3 0 3 0  Easily annoyed or irritable 3 0 3 3  Afraid - awful might happen 3 0 0 0  Total GAD 7 Score 21 0 15 11  Anxiety Difficulty Extremely difficult Not difficult at all Somewhat difficult Somewhat difficult       12/16/2023   11:25 AM 06/24/2023    2:25 PM 05/02/2023    2:54 PM  Depression screen PHQ 2/9  Decreased Interest 3 0 2  Down, Depressed, Hopeless 3 0 2  PHQ - 2 Score 6 0 4  Altered sleeping 3 0 3  Tired, decreased energy 3 0 3  Change in appetite 3 0 1  Feeling bad or failure about yourself  3 0 2  Trouble concentrating 1 0 0  Moving slowly or fidgety/restless 0 0 0  Suicidal thoughts 0 0   PHQ-9 Score 19 0 13  Difficult  doing work/chores  Not difficult at all Somewhat difficult  BP Readings from Last 3 Encounters:  12/16/23 128/72  06/24/23 118/76  05/02/23 120/78    Physical Exam Vitals and nursing note reviewed.  Constitutional:      General: She is not in acute distress.    Appearance: She is well-developed.  HENT:     Head: Normocephalic and atraumatic.  Cardiovascular:     Rate and Rhythm: Normal rate and regular rhythm.     Pulses: Normal pulses.  Pulmonary:     Effort: Pulmonary effort is normal. No respiratory distress.     Breath sounds: No wheezing or rhonchi.  Musculoskeletal:     Cervical back: Normal range of motion.     Right lower leg: No edema.     Left lower leg: No edema.  Lymphadenopathy:     Cervical: No cervical adenopathy.  Skin:    General: Skin is warm and dry.     Findings: No rash.  Neurological:     General: No focal deficit present.     Mental Status: She is alert and oriented to person, place, and time.     Gait: Gait normal.  Psychiatric:        Mood and Affect: Mood normal.        Behavior: Behavior normal.     Wt Readings from Last 3 Encounters:  12/16/23 186 lb (84.4 kg)  06/24/23 182 lb (82.6 kg)  05/02/23 182 lb (82.6 kg)    BP 128/72   Pulse 72   Ht 5' 3 (1.6 m)   Wt 186 lb (84.4 kg)   SpO2 98%   BMI 32.95 kg/m   Assessment and Plan:  Problem List Items Addressed This Visit       Unprioritized   Hyperlipidemia associated with type 2 diabetes mellitus (HCC)   LDL is  Lab Results  Component Value Date   LDLCALC 96 01/17/2023   Current regimen is Crestor  and Fenofibrate .  Tolerating medications well without issues.       Relevant Medications   tirzepatide  (MOUNJARO ) 2.5 MG/0.5ML Pen   rosuvastatin  (CRESTOR ) 40 MG tablet   Other Relevant Orders   Lipid panel   Major depression single episode, in partial remission (HCC)   Depression currently active due to holiday stresses, weight gain, etc Will continue current regimen  for now.      Relevant Medications   clonazePAM  (KLONOPIN ) 0.25 MG disintegrating tablet   Essential hypertension - Primary (Chronic)   Controlled BP with normal exam. Current regimen is hydrochlorothiazide  and lasix  10 mg. Will continue same medications; encourage continued reduced sodium diet.       Relevant Medications   rosuvastatin  (CRESTOR ) 40 MG tablet   Other Relevant Orders   Comprehensive metabolic panel   PMR (polymyalgia rheumatica) (HCC) (Chronic)   Doing well - tapered off of prednisone . Will recheck ESR      Relevant Orders   Sedimentation rate   Type II diabetes mellitus with complication (HCC)   Blood sugars stable without hypoglycemic symptoms or events. Currently managed with MTF and Ozempic . Changes made last visit are none.  She feels that Ozempic  is not controlling her appetite.  Will change to Mounjaro  and titrate up monthly. Lab Results  Component Value Date   HGBA1C 6.6 (H) 05/02/2023         Relevant Medications   tirzepatide  (MOUNJARO ) 2.5 MG/0.5ML Pen   rosuvastatin  (CRESTOR ) 40 MG tablet   Other Relevant Orders   Hemoglobin A1c   Microalbumin / creatinine urine ratio  Current moderate episode of major depressive disorder without prior episode (HCC)   Clinically stable on Bupropion  and Vraylar  with good response, No SI or HI reported. No change in management at this time.       Other Visit Diagnoses       Long term current use of oral hypoglycemic drug           Return in about 4 months (around 04/14/2024) for DM, HTN, Depression.    Leita HILARIO Adie, MD [facility] [place]

## 2023-12-16 NOTE — Telephone Encounter (Signed)
Please review.  KP

## 2023-12-16 NOTE — Assessment & Plan Note (Addendum)
>>  ASSESSMENT AND PLAN FOR CURRENT MODERATE EPISODE OF MAJOR DEPRESSIVE DISORDER WITHOUT PRIOR EPISODE (HCC) WRITTEN ON 12/16/2023  9:57 AM BY Oswald Pott H, MD  Clinically stable on Bupropion  and Vraylar  with good response, No SI or HI reported. No change in management at this time.    >>ASSESSMENT AND PLAN FOR MAJOR DEPRESSION SINGLE EPISODE, IN PARTIAL REMISSION (HCC) WRITTEN ON 12/16/2023 11:53 AM BY Yamen Castrogiovanni, LEITA DEL, MD  Depression currently active due to holiday stresses, weight gain, etc Will continue current regimen for now.

## 2023-12-17 LAB — HEMOGLOBIN A1C
Hgb A1c MFr Bld: 6.3 % — ABNORMAL HIGH (ref 4.8–5.6)
Mean Plasma Glucose: 134 mg/dL

## 2023-12-18 LAB — MICROALBUMIN / CREATININE URINE RATIO
Creatinine, Urine: 27.3 mg/dL
Microalb Creat Ratio: <11 mg/g{creat} (ref 0–29)
Microalb, Ur: <3 ug/mL — ABNORMAL HIGH

## 2024-01-13 ENCOUNTER — Other Ambulatory Visit: Payer: Self-pay | Admitting: Internal Medicine

## 2024-01-13 ENCOUNTER — Other Ambulatory Visit: Payer: Self-pay

## 2024-01-13 DIAGNOSIS — E118 Type 2 diabetes mellitus with unspecified complications: Secondary | ICD-10-CM

## 2024-01-13 MED ORDER — TIRZEPATIDE 5 MG/0.5ML ~~LOC~~ SOAJ: 5.0000 mg | SUBCUTANEOUS | 0 refills | Status: DC

## 2024-01-15 ENCOUNTER — Other Ambulatory Visit: Payer: Self-pay | Admitting: Internal Medicine

## 2024-01-15 DIAGNOSIS — M545 Low back pain, unspecified: Secondary | ICD-10-CM

## 2024-01-16 NOTE — Telephone Encounter (Signed)
Requested medication (s) are due for refill today -yes  Requested medication (s) are on the active medication list -yes  Future visit scheduled -yes  Last refill: 11/12/23 #90  Notes to clinic: non delegated Rx  Requested Prescriptions  Pending Prescriptions Disp Refills   cyclobenzaprine (FLEXERIL) 10 MG tablet [Pharmacy Med Name: CYCLOBENZAPRINE 10 MG TABLET] 90 tablet 0    Sig: TAKE 1 TABLET BY MOUTH THREE TIMES A DAY AS NEEDED FOR MUSCLE SPASM     Not Delegated - Analgesics:  Muscle Relaxants Failed - 01/16/2024  8:27 AM      Failed - This refill cannot be delegated      Passed - Valid encounter within last 6 months    Recent Outpatient Visits           1 month ago Essential hypertension   [place] [facility] at [facility], Nyoka Cowden, MD   6 months ago PMR (polymyalgia rheumatica) [facility])   [place] [facility] at [facility], Nyoka Cowden, MD   8 months ago Diabetes mellitus treated with oral medication [facility])   [place] [facility] at [facility], Nyoka Cowden, MD   9 months ago PMR (polymyalgia rheumatica) [facility])   [place] [facility] at [facility], Nyoka Cowden, MD   10 months ago Ventral hernia without obstruction or gangrene   [place] [facility] at [facility], Nyoka Cowden, MD       Future Appointments             In 1 week Judithann Graves Nyoka Cowden, MD [facility] at [facility], [facility]   In 2 weeks Fransico Michael, Cadence H, PA-C [facility] [place]   In 4 weeks Judithann Graves, Nyoka Cowden, MD [facility] at [facility], [facility]               Requested Prescriptions  Pending Prescriptions Disp Refills   cyclobenzaprine (FLEXERIL) 10 MG tablet [Pharmacy Med Name: CYCLOBENZAPRINE 10 MG TABLET] 90 tablet 0    Sig: TAKE 1 TABLET BY MOUTH THREE  TIMES A DAY AS NEEDED FOR MUSCLE SPASM     Not Delegated - Analgesics:  Muscle Relaxants Failed - 01/16/2024  8:27 AM      Failed - This refill cannot be delegated      Passed - Valid encounter within last 6 months    Recent Outpatient Visits           1 month ago Essential hypertension   [place] [facility] at [facility], Nyoka Cowden, MD   6 months ago PMR (polymyalgia rheumatica) [facility])   [place] [facility] at [facility], Nyoka Cowden, MD   8 months ago Diabetes mellitus treated with oral medication [facility])   [place] [facility] at [facility], Nyoka Cowden, MD   9 months ago PMR (polymyalgia rheumatica) [facility])   [place] [facility] at [facility], Nyoka Cowden, MD   10 months ago Ventral hernia without obstruction or gangrene   [facility] at [facility], Nyoka Cowden, MD       Future Appointments             In 1 week  Reubin Milan, MD [facility] Medicine at [facility], [facility]   In 2 weeks Fransico Michael, Cadence H, PA-C [place] [facility] [place] at [place]   In 4 weeks Judithann Graves, Nyoka Cowden, MD [facility] at [facility], [facility]

## 2024-01-26 ENCOUNTER — Ambulatory Visit: Payer: 59 | Admitting: Internal Medicine

## 2024-01-28 ENCOUNTER — Encounter: Payer: Self-pay | Admitting: Internal Medicine

## 2024-01-28 NOTE — Progress Notes (Deleted)
Date:  01/28/2024   Name:  Nancy Skinner   DOB:  Dec 11, 1966   MRN:  [id number]   Chief Complaint: No chief complaint on file. Nancy Skinner is a 58 y.o. female who presents today for her Complete Annual Exam. She feels {DESC; WELL/FAIRLY WELL/POORLY:18703}. She reports exercising ***. She reports she is sleeping {DESC; WELL/FAIRLY WELL/POORLY:18703}. Breast complaints ***.  Health Maintenance  Topic Date Due   Pneumococcal Vaccination (1 of 2 - PCV) Never done   Eye exam for diabetics  Never done   DTaP/Tdap/Td vaccine (1 - Tdap) Never done   COVID-19 Vaccine (3 - Moderna risk series) 11/24/2020   Flu Shot  03/15/2024*   Zoster (Shingles) Vaccine (1 of 2) 03/15/2024*   HIV Screening  12/15/2024*   Complete foot exam   05/01/2024   Mammogram  05/12/2024   Hemoglobin A1C  06/14/2024   Yearly kidney function blood test for diabetes  12/15/2024   Yearly kidney health urinalysis for diabetes  12/15/2024   Colon Cancer Screening  03/31/2030   Hepatitis C Screening  Completed   HPV Vaccine  Aged Out  *Topic was postponed. The date shown is not the original due date.    Diabetes She presents for her follow-up diabetic visit. She has type 2 diabetes mellitus. Her disease course has been stable. Pertinent negatives for hypoglycemia include no dizziness, headaches, nervousness/anxiousness or tremors. Pertinent negatives for diabetes include no chest pain, no fatigue, no polydipsia and no polyuria. Current diabetic treatments: MTF and Moujaro.  Hyperlipidemia This is a chronic problem. The problem is uncontrolled. Recent lipid tests were reviewed and are high. Pertinent negatives include no chest pain or shortness of breath. Current antihyperlipidemic treatment includes statins and fibric acid derivatives.    Review of Systems  Constitutional:  Negative for chills, fatigue and fever.  HENT:  Negative for congestion, hearing loss, tinnitus, trouble swallowing and voice change.   Eyes:   Negative for visual disturbance.  Respiratory:  Negative for cough, chest tightness, shortness of breath and wheezing.   Cardiovascular:  Negative for chest pain, palpitations and leg swelling.  Gastrointestinal:  Negative for abdominal pain, constipation, diarrhea and vomiting.  Endocrine: Negative for polydipsia and polyuria.  Genitourinary:  Negative for dysuria, frequency, genital sores, vaginal bleeding and vaginal discharge.  Musculoskeletal:  Negative for arthralgias, gait problem and joint swelling.  Skin:  Negative for color change and rash.  Neurological:  Negative for dizziness, tremors, light-headedness and headaches.  Hematological:  Negative for adenopathy. Does not bruise/bleed easily.  Psychiatric/Behavioral:  Negative for dysphoric mood and sleep disturbance. The patient is not nervous/anxious.      Lab Results  Component Value Date   NA 135 12/16/2023   K 3.7 12/16/2023   CO2 24 12/16/2023   GLUCOSE 104 (H) 12/16/2023   BUN 14 12/16/2023   CREATININE 0.91 12/16/2023   CALCIUM 9.1 12/16/2023   EGFR 85 01/17/2023   GFRNONAA >60 12/16/2023   Lab Results  Component Value Date   CHOL 215 (H) 12/16/2023   HDL 43 12/16/2023   LDLCALC 104 (H) 12/16/2023   LDLDIRECT 48 09/02/2019   TRIG 340 (H) 12/16/2023   CHOLHDL 5.0 12/16/2023   Lab Results  Component Value Date   TSH 2.380 01/17/2023   Lab Results  Component Value Date   HGBA1C 6.3 (H) 12/16/2023   Lab Results  Component Value Date   WBC 9.5 01/17/2023   HGB 12.7 01/17/2023   HCT 37.8 01/17/2023   MCV  86 01/17/2023   PLT 341 01/17/2023   Lab Results  Component Value Date   ALT 23 12/16/2023   AST 28 12/16/2023   ALKPHOS 34 (L) 12/16/2023   BILITOT 0.2 12/16/2023   No results found for: "25OHVITD2", "25OHVITD3", "VD25OH"   Patient Active Problem List   Diagnosis Date Noted   BMI 34.0-34.9,adult 03/05/2023   Type II diabetes mellitus with complication (HCC) 03/05/2023   PMR (polymyalgia  rheumatica) (HCC) 02/26/2023   Status post hysterectomy 03/05/2022   Essential hypertension 04/16/2021   Hepatic steatosis 03/19/2021   Carpal tunnel syndrome of right wrist 07/10/2020   Coronary artery disease of native artery of native heart with stable angina pectoris (HCC) 01/18/2019   Current moderate episode of major depressive disorder without prior episode (HCC) 01/18/2019   Neutrophilic leukocytosis 08/09/2018   Pulmonary nodule less than 6 cm determined by computed tomography of lung 07/30/2018   Night sweats 07/16/2018   Neck muscle spasm 07/15/2018   Hyperlipidemia associated with type 2 diabetes mellitus (HCC) 07/15/2018   Tobacco use disorder, moderate, in sustained remission 07/15/2018   Migraine without aura and without status migrainosus, not intractable 06/24/2018   Gastroesophageal reflux disease without esophagitis 06/24/2018   Chronic midline low back pain without sciatica 06/24/2018    Allergies  Allergen Reactions   Oxycodone Other (See Comments)    "Black outs" and confusion   Dye Fdc Red [Red Dye #40 (Allura Red)] Other (See Comments)    Severe migraine    Past Surgical History:  Procedure Laterality Date   ABDOMINAL HYSTERECTOMY  2012   partial for bleeding   BACK SURGERY     BREAST BIOPSY Right    neg   BREAST BIOPSY Left 2015   4 bxs- neg   BREAST SURGERY Left    multiple biopsies   CARDIAC CATHETERIZATION     CATARACT EXTRACTION Right 12/19/2010   CATARACT EXTRACTION Left    CHOLECYSTECTOMY  1999   COLONOSCOPY WITH PROPOFOL N/A 03/31/2020   Procedure: COLONOSCOPY WITH PROPOFOL;  Surgeon: Midge Minium, MD;  Location: [facility] ENDOSCOPY;  Service: Endoscopy;  Laterality: N/A;   CORONARY PRESSURE/FFR STUDY N/A 07/22/2018   Procedure: INTRAVASCULAR PRESSURE WIRE/FFR STUDY;  Surgeon: Yvonne Kendall, MD;  Location: [facility] INVASIVE CV LAB;  Service: Cardiovascular;  Laterality: N/A;   EYE SURGERY     INSERTION OF MESH  03/27/2023   Procedure: INSERTION  OF MESH;  Surgeon: Leafy Ro, MD;  Location: [facility] ORS;  Service: General;;   LEFT HEART CATH AND CORONARY ANGIOGRAPHY N/A 07/22/2018   Procedure: LEFT HEART CATH AND CORONARY ANGIOGRAPHY;  Surgeon: Yvonne Kendall, MD;  Location: [facility] INVASIVE CV LAB;  Service: Cardiovascular;  Laterality: N/A;   POSTERIOR LUMBAR FUSION 4 LEVEL  2000   l 4-5 fusion   VENTRAL HERNIA REPAIR N/A 03/27/2023   Procedure: HERNIA REPAIR VENTRAL ADULT, open;  Surgeon: Leafy Ro, MD;  Location: [facility] ORS;  Service: General;  Laterality: N/A;    Social History   Tobacco Use   Smoking status: Former    Current packs/day: 0.00    Average packs/day: 0.3 packs/day for 40.0 years (10.0 ttl pk-yrs)    Types: Cigarettes    Start date: 09/18/1979    Quit date: 09/18/2019    Years since quitting: 4.3    Passive exposure: Past   Smokeless tobacco: Never  Vaping Use   Vaping status: Never Used  Substance Use Topics   Alcohol use: Not Currently    Comment: less  than once a week.   Drug use: Never     Medication list has been reviewed and updated.  No outpatient medications have been marked as taking for the 01/28/24 encounter (Appointment) with Reubin Milan, MD.       12/16/2023   11:26 AM 06/24/2023    2:25 PM 05/02/2023    2:58 PM 03/21/2023   10:36 AM  GAD 7 : Generalized Anxiety Score  Nervous, Anxious, on Edge 3 0 2 3  Control/stop worrying 3 0 2 3  Worry too much - different things 3 0 2 0  Trouble relaxing 3 0 3 2  Restless 3 0 3 0  Easily annoyed or irritable 3 0 3 3  Afraid - awful might happen 3 0 0 0  Total GAD 7 Score 21 0 15 11  Anxiety Difficulty Extremely difficult Not difficult at all Somewhat difficult Somewhat difficult       12/16/2023   11:25 AM 06/24/2023    2:25 PM 05/02/2023    2:54 PM  Depression screen PHQ 2/9  Decreased Interest 3 0 2  Down, Depressed, Hopeless 3 0 2  PHQ - 2 Score 6 0 4  Altered sleeping 3 0 3  Tired, decreased energy 3 0 3  Change in appetite 3  0 1  Feeling bad or failure about yourself  3 0 2  Trouble concentrating 1 0 0  Moving slowly or fidgety/restless 0 0 0  Suicidal thoughts 0 0   PHQ-9 Score 19 0 13  Difficult doing work/chores  Not difficult at all Somewhat difficult    BP Readings from Last 3 Encounters:  12/16/23 128/72  06/24/23 118/76  05/02/23 120/78    Physical Exam Vitals and nursing note reviewed.  Constitutional:      General: She is not in acute distress.    Appearance: She is well-developed.  HENT:     Head: Normocephalic and atraumatic.     Right Ear: Tympanic membrane and ear canal normal.     Left Ear: Tympanic membrane and ear canal normal.     Nose:     Right Sinus: No maxillary sinus tenderness.     Left Sinus: No maxillary sinus tenderness.  Eyes:     General: No scleral icterus.       Right eye: No discharge.        Left eye: No discharge.     Conjunctiva/sclera: Conjunctivae normal.  Neck:     Thyroid: No thyromegaly.     Vascular: No carotid bruit.  Cardiovascular:     Rate and Rhythm: Normal rate and regular rhythm.     Pulses: Normal pulses.     Heart sounds: Normal heart sounds.  Pulmonary:     Effort: Pulmonary effort is normal. No respiratory distress.     Breath sounds: No wheezing.  Abdominal:     General: Bowel sounds are normal.     Palpations: Abdomen is soft.     Tenderness: There is no abdominal tenderness.  Musculoskeletal:     Cervical back: Normal range of motion. No erythema.     Right lower leg: No edema.     Left lower leg: No edema.  Lymphadenopathy:     Cervical: No cervical adenopathy.  Skin:    General: Skin is warm and dry.     Findings: No rash.  Neurological:     Mental Status: She is alert and oriented to person, place, and time.     Cranial  Nerves: No cranial nerve deficit.     Sensory: No sensory deficit.     Deep Tendon Reflexes: Reflexes are normal and symmetric.  Psychiatric:        Attention and Perception: Attention normal.         Mood and Affect: Mood normal.     Wt Readings from Last 3 Encounters:  12/16/23 186 lb (84.4 kg)  06/24/23 182 lb (82.6 kg)  05/02/23 182 lb (82.6 kg)    There were no vitals taken for this visit.  Assessment and Plan:  Problem List Items Addressed This Visit       Unprioritized   Coronary artery disease of native artery of native heart with stable angina pectoris (HCC) (Chronic)   No recurrent angina or shortness of breath. On ASA, statin Lab Results  Component Value Date   LDLCALC 104 (H) 12/16/2023         Essential hypertension (Chronic)   Controlled BP with normal exam. Current regimen is hydrochlorothiazide and lasix. Will continue same medications; encourage continued reduced sodium diet.       PMR (polymyalgia rheumatica) (HCC) (Chronic)   Type II diabetes mellitus with complication (HCC)   Blood sugars stable without hypoglycemic symptoms or events. Currently managed with MTF and Mounjaro. Changes made last visit are none. Lab Results  Component Value Date   HGBA1C 6.3 (H) 12/16/2023         Current moderate episode of major depressive disorder without prior episode [facility])   Other Visit Diagnoses       Annual physical exam    -  Primary     Encounter for screening mammogram for breast cancer           No follow-ups on file.    Reubin Milan, MD [facility] [place]

## 2024-01-28 NOTE — Assessment & Plan Note (Deleted)
Blood sugars stable without hypoglycemic symptoms or events. Currently managed with MTF and Mounjaro. Changes made last visit are none. Lab Results  Component Value Date   HGBA1C 6.3 (H) 12/16/2023

## 2024-01-28 NOTE — Assessment & Plan Note (Deleted)
Controlled BP with normal exam. Current regimen is hydrochlorothiazide and lasix. Will continue same medications; encourage continued reduced sodium diet.

## 2024-01-28 NOTE — Assessment & Plan Note (Deleted)
No recurrent angina or shortness of breath. On ASA, statin Lab Results  Component Value Date   LDLCALC 104 (H) 12/16/2023

## 2024-02-01 ENCOUNTER — Other Ambulatory Visit: Payer: Self-pay | Admitting: Internal Medicine

## 2024-02-01 DIAGNOSIS — R609 Edema, unspecified: Secondary | ICD-10-CM

## 2024-02-02 ENCOUNTER — Ambulatory Visit: Payer: 59 | Attending: Medical | Admitting: Medical

## 2024-02-02 NOTE — Progress Notes (Deleted)
  Cardiology Office Note:  .   Date:  02/02/2024  ID:  Nancy Skinner, DOB October 24, 1966, MRN [id number] PCP: Reubin Milan, MD  [facility] Providers Cardiologist:  Yvonne Kendall, MD { Click to update primary MD,subspecialty MD or APP then REFRESH:1}   History of Present Illness: .   Nancy Skinner is a 58 y.o. female nonobstructive coronary disease, hyperlipidemia, migraine disorder, depression, tobacco use, and hypertension who presents for 12 month follow-up.    She was admitted to the hospital 07/2018 with intermittent chest pain. Diagnostic cardiac catheterization with moderate nonobstructive LAD disease estimated at 60% with FFR 0.82, otherwise normal LCx and RCA. LVEF 55-65% with normal LVEDP and no aortic stenosis. She was recommended for medical therapy. Previous intolerance to Imdur with headache. She did stop Atorvastatin due to rash but she resumed and rash did not recur. Her Metoprolol has been previously de-escalated to fatigue with improvement in symptoms.    The patient was last seen 05/2022 and was overall stable from a cardiac perspective. She reported 50lbs weight loss with Ozempic.   Today,  ROS: ***  Studies Reviewed: .        *** Risk Assessment/Calculations:   {Does this patient have ATRIAL FIBRILLATION?:[phone]} No BP recorded.  {Refresh Note OR Click here to enter BP  :1}***       Physical Exam:   VS:  There were no vitals taken for this visit.   Wt Readings from Last 3 Encounters:  12/16/23 186 lb (84.4 kg)  06/24/23 182 lb (82.6 kg)  05/02/23 182 lb (82.6 kg)    GEN: Well nourished, well developed in no acute distress NECK: No JVD; No carotid bruits CARDIAC: ***RRR, no murmurs, rubs, gallops RESPIRATORY:  Clear to auscultation without rales, wheezing or rhonchi  ABDOMEN: Soft, non-tender, non-distended EXTREMITIES:  No edema; No deformity   ASSESSMENT AND PLAN: .   ***    {Are you ordering a CV Procedure (e.g. stress test, cath, DCCV,  TEE, etc)?   Press F2        :[id number]}  Dispo: ***  Signed, [place] David Stall, PA-C

## 2024-02-02 NOTE — Telephone Encounter (Signed)
Requested Prescriptions  Pending Prescriptions Disp Refills   furosemide (LASIX) 20 MG tablet [Pharmacy Med Name: FUROSEMIDE 20 MG TABLET] 90 tablet 0    Sig: TAKE 1 TABLET BY MOUTH EVERY DAY     Cardiovascular:  Diuretics - Loop Failed - 02/02/2024  3:34 PM      Failed - Mg Level in normal range and within 180 days    No results found for: "MG"       Passed - K in normal range and within 180 days    Potassium  Date Value Ref Range Status  12/16/2023 3.7 3.5 - 5.1 mmol/L Final         Passed - Ca in normal range and within 180 days    Calcium  Date Value Ref Range Status  12/16/2023 9.1 8.9 - 10.3 mg/dL Final         Passed - Na in normal range and within 180 days    Sodium  Date Value Ref Range Status  12/16/2023 135 135 - 145 mmol/L Final  01/17/2023 138 134 - 144 mmol/L Final         Passed - Cr in normal range and within 180 days    Creatinine, Ser  Date Value Ref Range Status  12/16/2023 0.91 0.44 - 1.00 mg/dL Final         Passed - Cl in normal range and within 180 days    Chloride  Date Value Ref Range Status  12/16/2023 101 98 - 111 mmol/L Final         Passed - Last BP in normal range    BP Readings from Last 1 Encounters:  12/16/23 128/72         Passed - Valid encounter within last 6 months    Recent Outpatient Visits           1 month ago Essential hypertension   [place] [facility] at [facility], Nyoka Cowden, MD   7 months ago PMR (polymyalgia rheumatica) [facility])   [place] [facility] at [facility], Nyoka Cowden, MD   9 months ago Diabetes mellitus treated with oral medication [facility])   [place] [facility] at [facility], Nyoka Cowden, MD   10 months ago PMR (polymyalgia rheumatica) [facility])   [place] [facility] at [facility], Nyoka Cowden, MD   10 months ago Ventral hernia without obstruction or gangrene   [facility]  Primary Care & [facility] Medicine at [facility], Nyoka Cowden, MD       Future Appointments             In 1 week Judithann Graves, Nyoka Cowden, MD [facility] at [facility]

## 2024-02-07 ENCOUNTER — Other Ambulatory Visit: Payer: Self-pay | Admitting: Internal Medicine

## 2024-02-07 DIAGNOSIS — F324 Major depressive disorder, single episode, in partial remission: Secondary | ICD-10-CM

## 2024-02-09 NOTE — Telephone Encounter (Signed)
 Requested Prescriptions  Pending Prescriptions Disp Refills   buPROPion (WELLBUTRIN XL) 300 MG 24 hr tablet [Pharmacy Med Name: BUPROPION HCL XL 300 MG TABLET] 90 tablet 1    Sig: TAKE 1 TABLET BY MOUTH EVERY DAY     Psychiatry: Antidepressants - bupropion Passed - 02/09/2024  2:44 PM      Passed - Cr in normal range and within 360 days    Creatinine, Ser  Date Value Ref Range Status  12/16/2023 0.91 0.44 - 1.00 mg/dL Final         Passed - AST in normal range and within 360 days    AST  Date Value Ref Range Status  12/16/2023 28 15 - 41 U/L Final         Passed - ALT in normal range and within 360 days    ALT  Date Value Ref Range Status  12/16/2023 23 0 - 44 U/L Final         Passed - Completed PHQ-2 or PHQ-9 in the last 360 days      Passed - Last BP in normal range    BP Readings from Last 1 Encounters:  12/16/23 128/72         Passed - Valid encounter within last 6 months    Recent Outpatient Visits           1 month ago Essential hypertension   [place] [facility] at [facility], Nyoka Cowden, MD   7 months ago PMR (polymyalgia rheumatica) [facility])   [place] [facility] at [facility], Nyoka Cowden, MD   9 months ago Diabetes mellitus treated with oral medication [facility])   [place] [facility] Medicine at [facility], Nyoka Cowden, MD   10 months ago PMR (polymyalgia rheumatica) [facility])   [place] [facility] at [facility], Nyoka Cowden, MD   10 months ago Ventral hernia without obstruction or gangrene   [facility] at [facility], Nyoka Cowden, MD       Future Appointments             In 4 days Judithann Graves Nyoka Cowden, MD [facility] Primary Care & [facility] Medicine at [facility], [facility]

## 2024-02-11 ENCOUNTER — Other Ambulatory Visit: Payer: Self-pay | Admitting: Internal Medicine

## 2024-02-11 DIAGNOSIS — E118 Type 2 diabetes mellitus with unspecified complications: Secondary | ICD-10-CM

## 2024-02-11 MED ORDER — TIRZEPATIDE 7.5 MG/0.5ML ~~LOC~~ SOAJ: 7.5000 mg | SUBCUTANEOUS | 1 refills | Status: DC

## 2024-02-13 ENCOUNTER — Encounter: Payer: Self-pay | Admitting: Internal Medicine

## 2024-02-13 ENCOUNTER — Ambulatory Visit: Payer: 59 | Admitting: Internal Medicine

## 2024-02-13 VITALS — BP 128/74 | HR 80 | Ht 63.0 in | Wt 186.0 lb

## 2024-02-13 DIAGNOSIS — I10 Essential (primary) hypertension: Secondary | ICD-10-CM

## 2024-02-13 DIAGNOSIS — I25118 Atherosclerotic heart disease of native coronary artery with other forms of angina pectoris: Secondary | ICD-10-CM

## 2024-02-13 DIAGNOSIS — E118 Type 2 diabetes mellitus with unspecified complications: Secondary | ICD-10-CM

## 2024-02-13 DIAGNOSIS — F321 Major depressive disorder, single episode, moderate: Secondary | ICD-10-CM

## 2024-02-13 DIAGNOSIS — Z7984 Long term (current) use of oral hypoglycemic drugs: Secondary | ICD-10-CM

## 2024-02-13 LAB — POCT GLYCOSYLATED HEMOGLOBIN (HGB A1C): Hemoglobin A1C: 6.1 % — AB (ref 4.0–5.6)

## 2024-02-13 MED ORDER — CARIPRAZINE HCL 3 MG PO CAPS: 3.0000 mg | ORAL_CAPSULE | Freq: Every day | ORAL | 1 refills | Status: DC

## 2024-02-13 MED ORDER — CLONAZEPAM 0.25 MG PO TBDP: 0.2500 mg | ORAL_TABLET | Freq: Two times a day (BID) | ORAL | 0 refills | Status: DC | PRN

## 2024-02-13 MED ORDER — ESCITALOPRAM OXALATE 20 MG PO TABS: 20.0000 mg | ORAL_TABLET | Freq: Every day | ORAL | 1 refills | Status: DC

## 2024-02-13 NOTE — Assessment & Plan Note (Addendum)
 Blood sugars stable without hypoglycemic symptoms or events. Currently managed with [facility] and MTF. Changes made last visit are none. Lab Results  Component Value Date   HGBA1C 6.1 (A) 02/13/2024  Continue current therapy.

## 2024-02-13 NOTE — Assessment & Plan Note (Addendum)
 Having more chest pain with recent stress Recommend follow up with Cardiology Continue statin and ASA.  She states that cardiology took her off of BB.

## 2024-02-13 NOTE — Assessment & Plan Note (Signed)
 Controlled BP with normal exam. Current regimen is Lasix. Will continue same medications; encourage continued reduced sodium diet.

## 2024-02-13 NOTE — Assessment & Plan Note (Signed)
 Persistent moderate to severe symptoms Will continue Lexapro and Bupropion Increase Vraylar to 3 mg; continue Clonazepam PRN

## 2024-02-13 NOTE — Progress Notes (Signed)
 Date:  02/13/2024   Name:  Nancy Skinner   DOB:  23-Nov-1966   MRN:  [id number]   Chief Complaint: Diabetes, Hypertension, and Depression  Diabetes She presents for her follow-up diabetic visit. She has type 2 diabetes mellitus. Her disease course has been stable. Hypoglycemia symptoms include nervousness/anxiousness. Pertinent negatives for hypoglycemia include no headaches or tremors. Associated symptoms include chest pain. Pertinent negatives for diabetes include no fatigue, no polydipsia and no polyuria. Current diabetic treatments: MTF and Mounjaro.  Hypertension This is a chronic problem. The problem is controlled. Associated symptoms include chest pain. Pertinent negatives include no headaches, palpitations or shortness of breath. Past treatments include diuretics. The current treatment provides significant improvement. Hypertensive end-organ damage includes CAD/MI.  Depression        This is a chronic problem.The problem is unchanged.  Associated symptoms include no fatigue, no appetite change and no headaches.  Past treatments include SSRIs - Selective serotonin reuptake inhibitors (with bupropion and Vraylar 1.5 gm).   Review of Systems  Constitutional:  Negative for appetite change, fatigue, fever and unexpected weight change.  HENT:  Negative for tinnitus and trouble swallowing.   Eyes:  Negative for visual disturbance.  Respiratory:  Negative for cough, chest tightness and shortness of breath.   Cardiovascular:  Positive for chest pain. Negative for palpitations and leg swelling.  Gastrointestinal:  Negative for abdominal pain.  Endocrine: Negative for polydipsia and polyuria.  Genitourinary:  Negative for dysuria and hematuria.  Musculoskeletal:  Negative for arthralgias.  Neurological:  Negative for tremors, numbness and headaches.  Psychiatric/Behavioral:  Positive for depression, dysphoric mood and sleep disturbance. The patient is nervous/anxious.      Lab Results   Component Value Date   NA 135 12/16/2023   K 3.7 12/16/2023   CO2 24 12/16/2023   GLUCOSE 104 (H) 12/16/2023   BUN 14 12/16/2023   CREATININE 0.91 12/16/2023   CALCIUM 9.1 12/16/2023   EGFR 85 01/17/2023   GFRNONAA >60 12/16/2023   Lab Results  Component Value Date   CHOL 215 (H) 12/16/2023   HDL 43 12/16/2023   LDLCALC 104 (H) 12/16/2023   LDLDIRECT 48 09/02/2019   TRIG 340 (H) 12/16/2023   CHOLHDL 5.0 12/16/2023   Lab Results  Component Value Date   TSH 2.380 01/17/2023   Lab Results  Component Value Date   HGBA1C 6.1 (A) 02/13/2024   Lab Results  Component Value Date   WBC 9.5 01/17/2023   HGB 12.7 01/17/2023   HCT 37.8 01/17/2023   MCV 86 01/17/2023   PLT 341 01/17/2023   Lab Results  Component Value Date   ALT 23 12/16/2023   AST 28 12/16/2023   ALKPHOS 34 (L) 12/16/2023   BILITOT 0.2 12/16/2023   No results found for: "25OHVITD2", "25OHVITD3", "VD25OH"   Patient Active Problem List   Diagnosis Date Noted   BMI 34.0-34.9,adult 03/05/2023   Type II diabetes mellitus with complication (HCC) 03/05/2023   PMR (polymyalgia rheumatica) (HCC) 02/26/2023   Status post hysterectomy 03/05/2022   Essential hypertension 04/16/2021   Hepatic steatosis 03/19/2021   Carpal tunnel syndrome of right wrist 07/10/2020   Coronary artery disease of native artery of native heart with stable angina pectoris (HCC) 01/18/2019   Current moderate episode of major depressive disorder without prior episode (HCC) 01/18/2019   Neutrophilic leukocytosis 08/09/2018   Pulmonary nodule less than 6 cm determined by computed tomography of lung 07/30/2018   Night sweats 07/16/2018   Neck muscle  spasm 07/15/2018   Hyperlipidemia associated with type 2 diabetes mellitus (HCC) 07/15/2018   Tobacco use disorder, moderate, in sustained remission 07/15/2018   Migraine without aura and without status migrainosus, not intractable 06/24/2018   Gastroesophageal reflux disease without  esophagitis 06/24/2018   Chronic midline low back pain without sciatica 06/24/2018    Allergies  Allergen Reactions   Oxycodone Other (See Comments)    "Black outs" and confusion   Dye Fdc Red [Red Dye #40 (Allura Red)] Other (See Comments)    Severe migraine    Past Surgical History:  Procedure Laterality Date   ABDOMINAL HYSTERECTOMY  2012   partial for bleeding   BACK SURGERY     BREAST BIOPSY Right    neg   BREAST BIOPSY Left 2015   4 bxs- neg   BREAST SURGERY Left    multiple biopsies   CARDIAC CATHETERIZATION     CATARACT EXTRACTION Right 12/19/2010   CATARACT EXTRACTION Left    CHOLECYSTECTOMY  1999   COLONOSCOPY WITH PROPOFOL N/A 03/31/2020   Procedure: COLONOSCOPY WITH PROPOFOL;  Surgeon: Midge Minium, MD;  Location: [facility] ENDOSCOPY;  Service: Endoscopy;  Laterality: N/A;   CORONARY PRESSURE/FFR STUDY N/A 07/22/2018   Procedure: INTRAVASCULAR PRESSURE WIRE/FFR STUDY;  Surgeon: Yvonne Kendall, MD;  Location: [facility] INVASIVE CV [facility];  Service: Cardiovascular;  Laterality: N/A;   EYE SURGERY     INSERTION OF MESH  03/27/2023   Procedure: INSERTION OF MESH;  Surgeon: Leafy Ro, MD;  Location: [facility] ORS;  Service: General;;   LEFT HEART CATH AND CORONARY ANGIOGRAPHY N/A 07/22/2018   Procedure: LEFT HEART CATH AND CORONARY ANGIOGRAPHY;  Surgeon: Yvonne Kendall, MD;  Location: [facility] INV[facility] CV [facility];  Service: Cardiovascular;  Laterality: N/A;   POSTERIOR LUMBAR FUSION 4 LEVEL  2000   l 4-5 fusion   VENTRAL HERNIA REPAIR N/A 03/27/2023   Procedure: HERNIA REPAIR VENTRAL ADULT, open;  Surgeon: Leafy Ro, MD;  Location: [facility] ORS;  Service: General;  Laterality: N/A;    Social History   Tobacco Use   Smoking status: Former    Current packs/day: 0.00    Average packs/day: 0.3 packs/day for 40.0 years (10.0 ttl pk-yrs)    Types: Cigarettes    Start date: 09/18/1979    Quit date: 09/18/2019    Years since quitting: 4.4    Passive exposure: Past   Smokeless tobacco:  Never  Vaping Use   Vaping status: Never Used  Substance Use Topics   Alcohol use: Not Currently    Comment: less than once a week.   Drug use: Never     Medication list has been reviewed and updated.  Current Meds  Medication Sig   albuterol (VENTOLIN HFA) 108 (90 Base) MCG/ACT inhaler INHALE 2 PUFFS INTO THE LUNGS EVERY 4 HOURS AS NEEDED FOR WHEEZE OR FOR SHORTNESS OF BREATH   aspirin EC 81 MG tablet Take 81 mg by mouth daily.   baclofen (LIORESAL) 10 MG tablet Take 1 tablet (10 mg total) by mouth 3 (three) times daily. (Patient taking differently: Take 10 mg by mouth 3 (three) times daily as needed for muscle spasms.)   buPROPion (WELLBUTRIN XL) 300 MG 24 hr tablet TAKE 1 TABLET BY MOUTH EVERY DAY   clotrimazole-betamethasone (LOTRISONE) cream APPLY 1 APPLICATION TOPICALLY 2 (TWO) TIMES DAILY. TO RASH ON LEGS (Patient taking differently: Apply 1 application  topically 2 (two) times daily as needed. To rash on legs)   cyclobenzaprine (FLEXERIL) 10 MG tablet TAKE  1 TABLET BY MOUTH THREE TIMES A DAY AS NEEDED FOR MUSCLE SPASM   Esomeprazole Magnesium (NEXIUM PO) Take 20 mg by mouth daily.    fenofibrate (TRICOR) 145 MG tablet TAKE 1 TABLET BY MOUTH EVERY DAY   furosemide (LASIX) 20 MG tablet TAKE 1 TABLET BY MOUTH EVERY DAY   hydrochlorothiazide (HYDRODIURIL) 25 MG tablet Take 1 tablet (25 mg total) by mouth daily.   ibuprofen (ADVIL) 600 MG tablet Take 1 tablet (600 mg total) by mouth every 6 (six) hours as needed.   KLOR-CON M10 10 MEQ tablet TAKE 1 TABLET BY MOUTH EVERY DAY   Lidocaine 4 % PTCH Apply 1 patch topically daily as needed (pain).    metFORMIN (GLUCOPHAGE) 500 MG tablet TAKE 1 TABLET BY MOUTH TWICE A DAY WITH FOOD   Multiple Vitamin (MULTIVITAMIN) capsule Take 1 capsule by mouth daily.   rosuvastatin (CRESTOR) 40 MG tablet Take 1 tablet (40 mg total) by mouth daily.   SUMAtriptan (IMITREX) 100 MG tablet TAKE 1 TABLET (100 MG TOTAL) BY MOUTH AS NEEDED FOR MIGRAINE. MAY  REPEAT IN 2 HOURS IF NEEDED   tirzepatide (MOUNJARO) 7.5 MG/0.5ML Pen Inject 7.5 mg into the skin once a week.   triamcinolone (KENALOG) 0.025 % ointment Apply 1 application topically as needed. For rash   [DISCONTINUED] cariprazine (VRAYLAR) 1.5 MG capsule TAKE 1 CAPSULE BY MOUTH EVERY DAY   [DISCONTINUED] clonazePAM (KLONOPIN) 0.25 MG disintegrating tablet Take 1 tablet (0.25 mg total) by mouth 2 (two) times daily as needed.   [DISCONTINUED] escitalopram (LEXAPRO) 20 MG tablet TAKE 1 TABLET BY MOUTH EVERY DAY       02/13/2024    3:05 PM 12/16/2023   11:26 AM 06/24/2023    2:25 PM 05/02/2023    2:58 PM  GAD 7 : Generalized Anxiety Score  Nervous, Anxious, on Edge 3 3 0 2  Control/stop worrying 3 3 0 2  Worry too much - different things 3 3 0 2  Trouble relaxing 3 3 0 3  Restless 3 3 0 3  Easily annoyed or irritable 3 3 0 3  Afraid - awful might happen 3 3 0 0  Total GAD 7 Score 21 21 0 15  Anxiety Difficulty Extremely difficult Extremely difficult Not difficult at all Somewhat difficult       02/13/2024    3:04 PM 12/16/2023   11:25 AM 06/24/2023    2:25 PM  Depression screen PHQ 2/9  Decreased Interest 3 3 0  Down, Depressed, Hopeless 3 3 0  PHQ - 2 Score 6 6 0  Altered sleeping 3 3 0  Tired, decreased energy 3 3 0  Change in appetite 3 3 0  Feeling bad or failure about yourself  3 3 0  Trouble concentrating 1 1 0  Moving slowly or fidgety/restless 0 0 0  Suicidal thoughts 0 0 0  PHQ-9 Score 19 19 0  Difficult doing work/chores Very difficult  Not difficult at all    BP Readings from Last 3 Encounters:  02/13/24 128/74  12/16/23 128/72  06/24/23 118/76    Physical Exam Vitals and nursing note reviewed.  Constitutional:      General: She is not in acute distress.    Appearance: Normal appearance. She is well-developed.  HENT:     Head: Normocephalic and atraumatic.  Neck:     Vascular: No carotid bruit.  Cardiovascular:     Rate and Rhythm: Normal rate and  regular rhythm.  Pulmonary:  Effort: Pulmonary effort is normal. No respiratory distress.     Breath sounds: No wheezing or rhonchi.  Musculoskeletal:     Cervical back: Normal range of motion.     Right lower leg: No edema.     Left lower leg: No edema.  Lymphadenopathy:     Cervical: No cervical adenopathy.  Skin:    General: Skin is warm and dry.     Findings: No rash.  Neurological:     General: No focal deficit present.     Mental Status: She is alert and oriented to person, place, and time.  Psychiatric:        Mood and Affect: Mood normal.        Behavior: Behavior normal.     Wt Readings from Last 3 Encounters:  02/13/24 186 lb (84.4 kg)  12/16/23 186 lb (84.4 kg)  06/24/23 182 lb (82.6 kg)    BP 128/74   Pulse 80   Ht 5\' 3"  (1.6 m)   Wt 186 lb (84.4 kg)   SpO2 98%   BMI 32.95 kg/m   Assessment and Plan:  Problem List Items Addressed This Visit       Unprioritized   Coronary artery disease of native artery of native heart with stable angina pectoris (HCC) (Chronic)   Having more chest pain with recent stress Recommend follow up with Cardiology Continue statin and ASA.  She states that cardiology took her off of BB.      Relevant Medications   escitalopram (LEXAPRO) 20 MG tablet   clonazePAM (KLONOPIN) 0.25 MG disintegrating tablet   Essential hypertension - Primary (Chronic)   Controlled BP with normal exam. Current regimen is Lasix. Will continue same medications; encourage continued reduced sodium diet.       Type II diabetes mellitus with complication (HCC)   Blood sugars stable without hypoglycemic symptoms or events. Currently managed with [facility] and MTF. Changes made last visit are none. Lab Results  Component Value Date   HGBA1C 6.1 (A) 02/13/2024  Continue current therapy.       Relevant Orders   POCT glycosylated hemoglobin (Hb A1C) (Completed)   Current moderate episode of major depressive disorder without prior episode  (HCC)   Persistent moderate to severe symptoms Will continue Lexapro and Bupropion Increase Vraylar to 3 mg; continue Clonazepam PRN      Relevant Medications   cariprazine (VRAYLAR) 3 MG capsule   escitalopram (LEXAPRO) 20 MG tablet   clonazePAM (KLONOPIN) 0.25 MG disintegrating tablet   Other Visit Diagnoses       Long term current use of oral hypoglycemic drug           Return in about 4 months (around 06/12/2024) for CPX.    Reubin Milan, MD [facility] [place]

## 2024-03-11 ENCOUNTER — Encounter: Payer: Self-pay | Admitting: Internal Medicine

## 2024-03-11 DIAGNOSIS — E118 Type 2 diabetes mellitus with unspecified complications: Secondary | ICD-10-CM

## 2024-03-12 ENCOUNTER — Other Ambulatory Visit: Payer: Self-pay | Admitting: Internal Medicine

## 2024-03-12 DIAGNOSIS — E118 Type 2 diabetes mellitus with unspecified complications: Secondary | ICD-10-CM

## 2024-03-12 MED ORDER — TIRZEPATIDE 10 MG/0.5ML ~~LOC~~ SOAJ: 10.0000 mg | SUBCUTANEOUS | 0 refills | Status: DC

## 2024-03-16 ENCOUNTER — Encounter: Payer: Self-pay | Admitting: Medical

## 2024-03-16 ENCOUNTER — Ambulatory Visit: Attending: Medical | Admitting: Medical

## 2024-03-16 VITALS — BP 126/70 | HR 80 | Ht 63.0 in | Wt 189.0 lb

## 2024-03-16 DIAGNOSIS — I10 Essential (primary) hypertension: Secondary | ICD-10-CM | POA: Diagnosis not present

## 2024-03-16 DIAGNOSIS — R079 Chest pain, unspecified: Secondary | ICD-10-CM

## 2024-03-16 DIAGNOSIS — I25118 Atherosclerotic heart disease of native coronary artery with other forms of angina pectoris: Secondary | ICD-10-CM

## 2024-03-16 DIAGNOSIS — E782 Mixed hyperlipidemia: Secondary | ICD-10-CM | POA: Diagnosis not present

## 2024-03-16 NOTE — Patient Instructions (Signed)
 Medication Instructions:  Your physician recommends that you continue on your current medications as directed. Please refer to the Current Medication list given to you today.   *If you need a refill on your cardiac medications before your next appointment, please call your pharmacy*  Lab Work: No labs ordered today   Testing/Procedures:    Please report to Radiology at [facility] Main Entrance, medical mall, 30 mins prior to your test.  [address]  [place], [place]  How to Prepare for Your Cardiac PET/CT Stress Test:  Nothing to eat or drink, except water, 3 hours prior to arrival time.  NO caffeine/decaffeinated products, or chocolate 12 hours prior to arrival. (Please note decaffeinated beverages (teas/coffees) still contain caffeine).  If you have caffeine within 12 hours prior, the test will need to be rescheduled.  Medication instructions: Do not take erectile dysfunction medications for 72 hours prior to test (sildenafil, tadalafil) Do not take nitrates (isosorbide mononitrate, Ranexa) the day before or day of test Do not take tamsulosin the day before or morning of test Hold theophylline containing medications for 12 hours. Hold Dipyridamole 48 hours prior to the test.  Diabetic Preparation: If able to eat breakfast prior to 3 hour fasting, you may take all medications, including your insulin. Do not worry if you miss your breakfast dose of insulin - start at your next meal. If you do not eat prior to 3 hour fast-Hold all diabetes (oral and insulin) medications. Patients who wear a continuous glucose monitor MUST remove the device prior to scanning.  You may take your remaining medications with water.  NO perfume, cologne or lotion on chest or abdomen area. FEMALES - Please avoid wearing dresses to this appointment.  Total time is 1 to 2 hours; you may want to bring reading material for the waiting time.  IF YOU THINK YOU MAY BE PREGNANT,  OR ARE NURSING PLEASE INFORM THE TECHNOLOGIST.  In preparation for your appointment, medication and supplies will be purchased.  Appointment availability is limited, so if you need to cancel or reschedule, please call the Radiology Department Scheduler at [phone] 24 hours in advance to avoid a cancellation fee of $100.00  What to Expect When you Arrive:  Once you arrive and check in for your appointment, you will be taken to a preparation room within the Radiology Department.  A technologist or Nurse will obtain your medical history, verify that you are correctly prepped for the exam, and explain the procedure.  Afterwards, an IV will be started in your arm and electrodes will be placed on your skin for EKG monitoring during the stress portion of the exam. Then you will be escorted to the PET/CT scanner.  There, staff will get you positioned on the scanner and obtain a blood pressure and EKG.  During the exam, you will continue to be connected to the EKG and blood pressure machines.  A small, safe amount of a radioactive tracer will be injected in your IV to obtain a series of pictures of your heart along with an injection of a stress agent.    After your Exam:  It is recommended that you eat a meal and drink a caffeinated beverage to counter act any effects of the stress agent.  Drink plenty of fluids for the remainder of the day and urinate frequently for the first couple of hours after the exam.  Your doctor will inform you of your test results within 7-10 business days.  For  more information and frequently asked questions, please visit our website: [email or web address]  For questions about your test or how to prepare for your test, please call: Cardiac Imaging Nurse Navigators Office: [phone]   Follow-Up: At [facility], you and your health needs are our priority.  As part of our continuing mission to provide you with exceptional heart care, our providers  are all part of one team.  This team includes your primary Cardiologist (physician) and Advanced Practice Providers or APPs (Physician Assistants and Nurse Practitioners) who all work together to provide you with the care you need, when you need it.  Your next appointment:   1 month(s)  Provider:   You may see Yvonne Kendall, MD or one of the following Advanced Practice Providers on your designated Care Team:   Nicolasa Ducking, NP Ames Dura, PA-C Eula Listen, PA-C Cadence [place], PA-C Charlsie Quest, NP Carlos Levering, NP    We recommend signing up for the patient portal called "MyChart".  Sign up information is provided on this After Visit Summary.  MyChart is used to connect with patients for Virtual Visits (Telemedicine).  Patients are able to view lab/test results, encounter notes, upcoming appointments, etc.  Non-urgent messages can be sent to your provider as well.   To learn more about what you can do with MyChart, go to [email or web address].

## 2024-03-16 NOTE — Progress Notes (Signed)
 Cardiology Office Note:  .   Date:  03/16/2024  ID:  Nancy Skinner, DOB [birth year], MRN [id number] PCP: Nancy Milan, MD  [facility] Providers Cardiologist:  Nancy Kendall, MD {  History of Present Illness: .   Nancy Skinner is a 58 y.o. female with a hx of nonobstructive coronary disease, hyperlipidemia, migraine disorder, depression, tobacco use, and hypertension who presents for 12 month follow-up/chest pain.    She was admitted to the hospital 07/2018 with intermittent chest pain. Diagnostic cardiac catheterization with moderate nonobstructive LAD disease estimated at 60% with FFR 0.82, otherwise normal LCx and RCA. LVEF 55-65% with normal LVEDP and no aortic stenosis. She was recommended for medical therapy. Previous intolerance to Imdur with headache. She did stop Atorvastatin due to rash but she resumed and rash did not recur. Her Metoprolol has been previously de-escalated to fatigue with improvement in symptoms.   The patient was last seen 05/2022 and was doing well from a cardiac perspective.   Today, the patient reports chest pain in the center of her chest.  If she pushes on her chest the pain stops. It's a stabbing pain that comes and goes. This may occur a couple times a week. She feels it is stress related. She has not taken ASA or SL NTG. She has associated SOB. No N/V. Sometimes has a bad headahce and takes migraine medication. She has no lower leg edema. No palpitations or heart racing.   Studies Reviewed: Nancy Skinner Kitchen   EKG Interpretation Date/Time:  Tuesday March 16 2024 15:10:00 EDT Ventricular Rate:  80 PR Interval:  182 QRS Duration:  88 QT Interval:  380 QTC Calculation: 438 R Axis:   24  Text Interpretation: Normal sinus rhythm Normal ECG When compared with ECG of 26-Mar-2023 15:29, No significant change was found Confirmed by Nancy Skinner, Ambrose Wile ([id number]) on 03/16/2024 3:25:09 PM    Cardiac CTA 02/2021 IMPRESSION: 1. Coronary calcium score of 16. This was 86th  percentile for age and sex matched control.   2. Normal coronary origin with right dominance.   3. Non calcified plaque causing moderate stenosis (50%) in the proximal LAD   4. CAD-RADS 3. Moderate stenosis. Consider symptom-guided anti-ischemic pharmacotherapy as well as risk factor modification per guideline directed care.   5. Additional analysis with CT FFR will be submitted and reported separately.   1. Left Main:  No significant stenosis.   2. LAD: No significant stenosis.  Mid LAD with FFRct 0.88 3. LCX: No significant stenosis.  FFRct 0.87 4. RCA: No significant stenosis.  FFRct 0.93   IMPRESSION: 1.  CT FFR analysis didn't show any significant stenosis.  [facility] 2019 Conclusions: Moderate focal mid LAD stenosis, that is not hemodynamically significant by iFR/FFR. No significant coronary artery disease involving the LCx or RCA. Normal left ventricular systolic function and filling pressure.   Recommendations: Optimize medical therapy.  Will continue metoprolol succinate 12.5 mg daily and add isosorbide mononitrate 15 mg daily. Initiate atorvastatin 40 mg daily to prevent progression of CAD. Follow-up as an outpatient in 2 to 4 weeks to reassess symptoms. Seed with CT of the chest and further work-up of recently diagnosed lung nodule.   Recommend Aspirin 81mg  daily for moderate CAD.    Nancy Kendall, MD [facility] Pager: [phone]      Physical Exam:   VS:  BP 126/70 (BP Location: Left Arm)   Pulse 80   Ht 5\' 3"  (1.6 m)   Wt 189 lb (85.7 kg)  SpO2 94%   BMI 33.48 kg/m    Wt Readings from Last 3 Encounters:  03/16/24 189 lb (85.7 kg)  02/13/24 186 lb (84.4 kg)  12/16/23 186 lb (84.4 kg)    GEN: Well nourished, well developed in no acute distress NECK: No JVD; No carotid bruits CARDIAC: RRR, no murmurs, rubs, gallops RESPIRATORY:  Clear to auscultation without rales, wheezing or rhonchi  ABDOMEN: Soft, non-tender, non-distended EXTREMITIES:   No edema; No deformity   ASSESSMENT AND PLAN: .    Chest pain Nonobstructive CAD She reports intermittent chest pain that sounds more atypical. She feels it is related to stress. Prior heart cath in 2019 showed mLAD stenosis 60%. Cardiac CTA 2022 showed coronary calcium score of 16 with noncalcified plaque int he LAD causing 50% stenosis, FFR was negative. I will order a Cardiac PET stress test. Continue ASA 81mg  daily and Crestor 40mg  daily.   HLD LDL 104, TG 340, total chol 215, HDL 43. Continue Crestor 40mg  daily.   HTN BP is normal today, continue lasix 20mg  daily.     Informed Consent   Shared Decision Making/Informed Consent The risks [chest pain, shortness of breath, cardiac arrhythmias, dizziness, blood pressure fluctuations, myocardial infarction, stroke/transient ischemic attack, nausea, vomiting, allergic reaction, radiation exposure, metallic taste sensation and life-threatening complications (estimated to be 1 in 10,000)], benefits (risk stratification, diagnosing coronary artery disease, treatment guidance) and alternatives of a cardiac PET stress test were discussed in detail with Nancy Skinner and she agrees to proceed.     Dispo: Follow-up in 1 month  Signed, Nancy Membreno David Stall, PA-C

## 2024-03-29 ENCOUNTER — Other Ambulatory Visit: Payer: Self-pay | Admitting: Internal Medicine

## 2024-03-29 DIAGNOSIS — Z1231 Encounter for screening mammogram for malignant neoplasm of breast: Secondary | ICD-10-CM

## 2024-04-13 ENCOUNTER — Ambulatory Visit: Admitting: Internal Medicine

## 2024-04-13 ENCOUNTER — Other Ambulatory Visit (INDEPENDENT_AMBULATORY_CARE_PROVIDER_SITE_OTHER): Payer: Self-pay | Admitting: Radiology

## 2024-04-13 ENCOUNTER — Other Ambulatory Visit: Payer: Self-pay | Admitting: Internal Medicine

## 2024-04-13 ENCOUNTER — Ambulatory Visit: Admitting: Family Medicine

## 2024-04-13 ENCOUNTER — Encounter: Payer: Self-pay | Admitting: Family Medicine

## 2024-04-13 VITALS — BP 94/66 | HR 90 | Ht 63.0 in | Wt 188.0 lb

## 2024-04-13 DIAGNOSIS — M2392 Unspecified internal derangement of left knee: Secondary | ICD-10-CM

## 2024-04-13 DIAGNOSIS — E118 Type 2 diabetes mellitus with unspecified complications: Secondary | ICD-10-CM

## 2024-04-13 MED ORDER — TIRZEPATIDE 12.5 MG/0.5ML ~~LOC~~ SOAJ: 12.5000 mg | SUBCUTANEOUS | 0 refills | Status: DC

## 2024-04-13 MED ORDER — DICLOFENAC SODIUM 50 MG PO TBEC: 50.0000 mg | DELAYED_RELEASE_TABLET | Freq: Two times a day (BID) | ORAL | 0 refills | Status: DC | PRN

## 2024-04-13 NOTE — Telephone Encounter (Signed)
PT response

## 2024-04-13 NOTE — Telephone Encounter (Signed)
 Pt response.  KP

## 2024-04-13 NOTE — Patient Instructions (Addendum)
 You have just been given a cortisone injection to reduce pain and inflammation. After the injection you may notice immediate relief of pain as a result of the Lidocaine . It is important to rest the area of the injection for 24 to 48 hours after the injection. There is a possibility of some temporary increased discomfort and swelling for up to 72 hours until the cortisone begins to work. If you do have pain, simply rest the joint and use ice. If you can tolerate over the counter medications, you can try Tylenol , Aleve, or Advil  for added relief per package instructions.  Patient Plan  Left Knee Injury:  Take diclofenac twice daily with food until symptoms improve. Use a hinge knee brace for support. After the injection, rest, apply ice, and limit activities to daily tasks for two days. Resume normal activities after two days, but continue using the knee brace for two weeks. Monitor symptoms and schedule a follow-up appointment in two weeks. Contact your healthcare provider if symptoms do not improve or worsen, as imaging may be needed.  Red Flags:  - If you experience increased pain, swelling, or instability, contact your healthcare provider immediately.

## 2024-04-13 NOTE — Progress Notes (Signed)
 Primary Care / Sports Medicine Office Visit  Patient Information:  Patient ID: Nancy Skinner, female DOB: [birth year] Age: 58 y.o. MRN: [id number]   Nancy Skinner is a pleasant 58 y.o. female presenting with the following:  Chief Complaint  Patient presents with   Knee Pain    Left knee pain x 2 weeks. Patient has been using lidocaine  patches, nervive cream, and muscle relaxer's. She is having difficulty sit to stand , weight bearing, and walking. The pain is on the medial aspect of her knee. She is also having swelling with the pain.     Vitals:   04/13/24 1450  BP: 94/66  Pulse: 90  SpO2: 98%   Vitals:   04/13/24 1450  Weight: 188 lb (85.3 kg)  Height: 5\' 3"  (1.6 m)   Body mass index is 33.3 kg/m.  No results found.   Independent interpretation of notes and tests performed by another provider:   None  Procedures performed:   Procedure:  Injection after aspiration of left knee under ultrasound guidance. Ultrasound guidance utilized for suprapatellar approach, effusion noted, confirmation of full aspiration documented Samsung HS60 device utilized with permanent recording / reporting. Verbal informed consent obtained and verified. Skin prepped in a sterile fashion. Ethyl chloride for topical local analgesia.  Completed without difficulty and tolerated well. Aspirate: 23 cc noninflammatory fluid Medication: triamcinolone  acetonide 40 mg/mL suspension for injection 1 mL total and 10 mL lidocaine  1% without epinephrine  utilized for needle placement anesthetic Advised to contact for fevers/chills, erythema, induration, drainage, or persistent bleeding.   Pertinent History, Exam, Impression, and Recommendations:   Problem List Items Addressed This Visit     Internal derangement of left knee - Primary   History of Present Illness Nancy Skinner is a 58 year old female who presents with left knee pain and swelling following a hyperextension injury.  Approximately  two and a half weeks ago, she sustained a hyperextension injury to her left knee while playing with her large dogs, leading to significant pain and swelling. She uses over-the-counter ibuprofen  and a topical treatment with lidocaine  and miochol, with limited relief from ibuprofen  but some benefit from the topical treatment.  The pain is sometimes sharp and sometimes stiff, primarily at the medial joint line and anterior aspect of the knee. 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 These symptoms have significantly affected her daily activities, including walking and job performance. She has avoided wearing her usual three-inch heels since the injury due to fear of falling.  There are no previous knee issues or similar injuries. She experiences occasional sharp pain and stiffness but no locking or significant buckling of the knee.  Physical Exam INSPECTION: Mild swelling of the left knee, no ecchymosis or erythema. PALPATION: 2+ effusion in the left knee. Tenderness at the medial joint line, anterior aspect of the left knee, and secondary tenderness at the pes anserine bursa. RANGE OF MOTION: Range of motion 0 to 90 degrees, limited by painful terminal flexion. STRENGTH: 5/5 strength in hip flexion, elicits anterior knee pain. SPECIAL TESTS: McMurray test localizes medially, negative for valgus and varus stress, negative anterior and posterior drawer tests.  LABS A1c: 6.1% (02/13/2024)  Assessment and Plan Internal Derangement Left Knee Anteromedial left knee pain in the left knee post-hyperextension injury. Pain is sharp, shooting, with stiffness and instability. Tenderness at medial joint line and minimally to pes anserine bursa. There is some focality to  the medial meniscus. Negative stress tests. Informed consent obtained for corticosteroid injection. - Administer intra-articular corticosteroid injection with  lidocaine  after aspirating knee, performed under ultrasound guidance. - Prescribe diclofenac to take twice daily with food until symptoms respond to cortisone. - Advise use of hinge knee brace for support. - Instruct on post-procedure care: rest, ice, limit activities to ADLs for two days. - Advise resumption of normal activities after two days, continue knee brace for two weeks. - Monitor symptoms, reassess in two weeks. - Consider imaging if symptoms persist or worsen.      Relevant Medications   diclofenac (VOLTAREN) 50 MG EC tablet   Other Relevant Orders   US  LIMITED JOINT SPACE STRUCTURES LOW LEFT     Orders & Medications Medications:  Meds ordered this encounter  Medications   diclofenac (VOLTAREN) 50 MG EC tablet    Sig: Take 1 tablet (50 mg total) by mouth 2 (two) times daily as needed.    Dispense:  30 tablet    Refill:  0   Orders Placed This Encounter  Procedures   US  LIMITED JOINT SPACE STRUCTURES LOW LEFT     No follow-ups on file.     Ma Saupe, MD, [facility]   Primary Care Sports Medicine Primary Care and Sports Medicine at [facility]

## 2024-04-13 NOTE — Assessment & Plan Note (Addendum)
 History of Present Illness Nancy Skinner is a 58 year old female who presents with left knee pain and swelling following a hyperextension injury.  Approximately two and a half weeks ago, she sustained a hyperextension injury to her left knee while playing with her large dogs, leading to significant pain and swelling. She uses over-the-counter ibuprofen  and a topical treatment with lidocaine  and miochol, with limited relief from ibuprofen  but some benefit from the topical treatment.  The pain is sometimes sharp and sometimes stiff, primarily at the medial joint line and anterior aspect of the knee. 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 These symptoms have significantly affected her daily activities, including walking and job performance. She has avoided wearing her usual three-inch heels since the injury due to fear of falling.  There are no previous knee issues or similar injuries. She experiences occasional sharp pain and stiffness but no locking or significant buckling of the knee.  Physical Exam INSPECTION: Mild swelling of the left knee, no ecchymosis or erythema. PALPATION: 2+ effusion in the left knee. Tenderness at the medial joint line, anterior aspect of the left knee, and secondary tenderness at the pes anserine bursa. RANGE OF MOTION: Range of motion 0 to 90 degrees, limited by painful terminal flexion. STRENGTH: 5/5 strength in hip flexion, elicits anterior knee pain. SPECIAL TESTS: McMurray test localizes medially, negative for valgus and varus stress, negative anterior and posterior drawer tests.  LABS A1c: 6.1% (02/13/2024)  Assessment and Plan Internal Derangement Left Knee Anteromedial left knee pain in the left knee post-hyperextension injury. Pain is sharp, shooting, with stiffness and instability. Tenderness at medial joint line and minimally to pes anserine bursa. There is some focality to  the medial meniscus. Negative stress tests. Informed consent obtained for corticosteroid injection. - Administer intra-articular corticosteroid injection with lidocaine  after aspirating knee, performed under ultrasound guidance. - Prescribe diclofenac to take twice daily with food until symptoms respond to cortisone. - Advise use of hinge knee brace for support. - Instruct on post-procedure care: rest, ice, limit activities to ADLs for two days. - Advise resumption of normal activities after two days, continue knee brace for two weeks. - Monitor symptoms, reassess in two weeks. - Consider imaging if symptoms persist or worsen.

## 2024-04-13 NOTE — Progress Notes (Unsigned)
 Date:  04/13/2024   Name:  Nancy Skinner   DOB:  1966-05-30   MRN:  [id number]   Chief Complaint: No chief complaint on file.  HPI  Review of Systems   Lab Results  Component Value Date   NA 135 12/16/2023   K 3.7 12/16/2023   CO2 24 12/16/2023   GLUCOSE 104 (H) 12/16/2023   BUN 14 12/16/2023   CREATININE 0.91 12/16/2023   CALCIUM  9.1 12/16/2023   EGFR 85 01/17/2023   GFRNONAA >60 12/16/2023   Lab Results  Component Value Date   CHOL 215 (H) 12/16/2023   HDL 43 12/16/2023   LDLCALC 104 (H) 12/16/2023   LDLDIRECT 48 09/02/2019   TRIG 340 (H) 12/16/2023   CHOLHDL 5.0 12/16/2023   Lab Results  Component Value Date   TSH 2.380 01/17/2023   Lab Results  Component Value Date   HGBA1C 6.1 (A) 02/13/2024   Lab Results  Component Value Date   WBC 9.5 01/17/2023   HGB 12.7 01/17/2023   HCT 37.8 01/17/2023   MCV 86 01/17/2023   PLT 341 01/17/2023   Lab Results  Component Value Date   ALT 23 12/16/2023   AST 28 12/16/2023   ALKPHOS 34 (L) 12/16/2023   BILITOT 0.2 12/16/2023   No results found for: "25OHVITD2", "25OHVITD3", "VD25OH"   Patient Active Problem List   Diagnosis Date Noted   BMI 34.0-34.9,adult 03/05/2023   Type II diabetes mellitus with complication (HCC) 03/05/2023   PMR (polymyalgia rheumatica) (HCC) 02/26/2023   Status post hysterectomy 03/05/2022   Essential hypertension 04/16/2021   Hepatic steatosis 03/19/2021   Carpal tunnel syndrome of right wrist 07/10/2020   Coronary artery disease of native artery of native heart with stable angina pectoris (HCC) 01/18/2019   Current moderate episode of major depressive disorder without prior episode (HCC) 01/18/2019   Neutrophilic leukocytosis 08/09/2018   Pulmonary nodule less than 6 cm determined by computed tomography of lung 07/30/2018   Night sweats 07/16/2018   Neck muscle spasm 07/15/2018   Hyperlipidemia associated with type 2 diabetes mellitus (HCC) 07/15/2018   Tobacco use disorder,  moderate, in sustained remission 07/15/2018   Migraine without aura and without status migrainosus, not intractable 06/24/2018   Gastroesophageal reflux disease without esophagitis 06/24/2018   Chronic midline low back pain without sciatica 06/24/2018    Allergies  Allergen Reactions   Oxycodone Other (See Comments)    "Black outs" and confusion   Dye Fdc Red [Red Dye #40 (Allura Red)] Other (See Comments)    Severe migraine    Past Surgical History:  Procedure Laterality Date   ABDOMINAL HYSTERECTOMY  2012   partial for bleeding   BACK SURGERY     BREAST BIOPSY Right    neg   BREAST BIOPSY Left 2015   4 bxs- neg   BREAST SURGERY Left    multiple biopsies   CARDIAC CATHETERIZATION     CATARACT EXTRACTION Right 12/19/2010   CATARACT EXTRACTION Left    CHOLECYSTECTOMY  1999   COLONOSCOPY WITH PROPOFOL  N/A 03/31/2020   Procedure: COLONOSCOPY WITH PROPOFOL ;  Surgeon: Marnee Sink, MD;  Location: [facility] ENDOSCOPY;  Service: Endoscopy;  Laterality: N/A;   CORONARY PRESSURE/FFR STUDY N/A 07/22/2018   Procedure: INTRAVASCULAR PRESSURE WIRE/FFR STUDY;  Surgeon: Sammy Crisp, MD;  Location: [facility] INVASIVE CV LAB;  Service: Cardiovascular;  Laterality: N/A;   EYE SURGERY     INSERTION OF MESH  03/27/2023   Procedure: INSERTION OF MESH;  Surgeon: Dana Duncan,  Marcial Setting, MD;  Location: [facility] ORS;  Service: General;;   LEFT HEART CATH AND CORONARY ANGIOGRAPHY N/A 07/22/2018   Procedure: LEFT HEART CATH AND CORONARY ANGIOGRAPHY;  Surgeon: Sammy Crisp, MD;  Location: [facility] INVASIVE CV LAB;  Service: Cardiovascular;  Laterality: N/A;   POSTERIOR LUMBAR FUSION 4 LEVEL  2000   l 4-5 fusion   VENTRAL HERNIA REPAIR N/A 03/27/2023   Procedure: HERNIA REPAIR VENTRAL ADULT, open;  Surgeon: Alben Alma, MD;  Location: [facility] ORS;  Service: General;  Laterality: N/A;    Social History   Tobacco Use   Smoking status: Former    Current packs/day: 0.00    Average packs/day: 0.3 packs/day for 40.0  years (10.0 ttl pk-yrs)    Types: Cigarettes    Start date: 09/18/1979    Quit date: 09/18/2019    Years since quitting: 4.5    Passive exposure: Past   Smokeless tobacco: Never  Vaping Use   Vaping status: Never Used  Substance Use Topics   Alcohol use: Not Currently    Comment: less than once a week.   Drug use: Never     Medication list has been reviewed and updated.  No outpatient medications have been marked as taking for the 04/13/24 encounter (Orders Only) with Sheron Dixons, MD.       02/13/2024    3:05 PM 12/16/2023   11:26 AM 06/24/2023    2:25 PM 05/02/2023    2:58 PM  GAD 7 : Generalized Anxiety Score  Nervous, Anxious, on Edge 3 3 0 2  Control/stop worrying 3 3 0 2  Worry too much - different things 3 3 0 2  Trouble relaxing 3 3 0 3  Restless 3 3 0 3  Easily annoyed or irritable 3 3 0 3  Afraid - awful might happen 3 3 0 0  Total GAD 7 Score 21 21 0 15  Anxiety Difficulty Extremely difficult Extremely difficult Not difficult at all Somewhat difficult       02/13/2024    3:04 PM 12/16/2023   11:25 AM 06/24/2023    2:25 PM  Depression screen PHQ 2/9  Decreased Interest 3 3 0  Down, Depressed, Hopeless 3 3 0  PHQ - 2 Score 6 6 0  Altered sleeping 3 3 0  Tired, decreased energy 3 3 0  Change in appetite 3 3 0  Feeling bad or failure about yourself  3 3 0  Trouble concentrating 1 1 0  Moving slowly or fidgety/restless 0 0 0  Suicidal thoughts 0 0 0  PHQ-9 Score 19 19 0  Difficult doing work/chores Very difficult  Not difficult at all    BP Readings from Last 3 Encounters:  03/16/24 126/70  02/13/24 128/74  12/16/23 128/72    Physical Exam  Wt Readings from Last 3 Encounters:  03/16/24 189 lb (85.7 kg)  02/13/24 186 lb (84.4 kg)  12/16/23 186 lb (84.4 kg)    There were no vitals taken for this visit.  Assessment and Plan:  Problem List Items Addressed This Visit       Unprioritized   Type II diabetes mellitus with complication (HCC)     No follow-ups on file.    Sheron Dixons, MD [facility] Primary Care and Sports Medicine [place]

## 2024-04-13 NOTE — Telephone Encounter (Signed)
 Please review.  KP

## 2024-04-20 ENCOUNTER — Ambulatory Visit: Admitting: Medical

## 2024-04-24 ENCOUNTER — Other Ambulatory Visit: Payer: Self-pay | Admitting: Family Medicine

## 2024-04-24 DIAGNOSIS — M2392 Unspecified internal derangement of left knee: Secondary | ICD-10-CM

## 2024-04-27 ENCOUNTER — Encounter: Payer: Self-pay | Admitting: Internal Medicine

## 2024-04-27 ENCOUNTER — Ambulatory Visit (INDEPENDENT_AMBULATORY_CARE_PROVIDER_SITE_OTHER): Admitting: Family Medicine

## 2024-04-27 ENCOUNTER — Encounter: Admitting: Internal Medicine

## 2024-04-27 ENCOUNTER — Encounter: Payer: Self-pay | Admitting: Family Medicine

## 2024-04-27 VITALS — BP 110/70 | HR 84 | Ht 63.0 in | Wt 184.4 lb

## 2024-04-27 DIAGNOSIS — M2392 Unspecified internal derangement of left knee: Secondary | ICD-10-CM | POA: Diagnosis not present

## 2024-04-27 NOTE — Telephone Encounter (Signed)
 Requested medication (s) are due for refill today: yes  Requested medication (s) are on the active medication list: yes  Last refill:  04/13/24 #30  Future visit scheduled: today   Notes to clinic:  was not sure if could refill since given a small amount to start with   Requested Prescriptions  Pending Prescriptions Disp Refills   diclofenac  (VOLTAREN ) 50 MG EC tablet [Pharmacy Med Name: DICLOFENAC  SOD EC 50 MG TAB] 30 tablet 0    Sig: TAKE 1 TABLET BY MOUTH 2 TIMES DAILY AS NEEDED.     Analgesics:  NSAIDS Failed - 04/27/2024  9:27 AM      Failed - Manual Review: Labs are only required if the patient has taken medication for more than 8 weeks.      Failed - HGB in normal range and within 360 days    Hemoglobin  Date Value Ref Range Status  01/17/2023 12.7 11.1 - 15.9 g/dL Final         Failed - PLT in normal range and within 360 days    Platelets  Date Value Ref Range Status  01/17/2023 341 150 - 450 x10E3/uL Final         Failed - HCT in normal range and within 360 days    Hematocrit  Date Value Ref Range Status  01/17/2023 37.8 34.0 - 46.6 % Final         Passed - Cr in normal range and within 360 days    Creatinine, Ser  Date Value Ref Range Status  12/16/2023 0.91 0.44 - 1.00 mg/dL Final         Passed - eGFR is 30 or above and within 360 days    GFR calc Af Amer  Date Value Ref Range Status  09/11/2020 100 >59 mL/min/1.73 Final    Comment:    **[facility] currently reports eGFR in compliance with the current**   recommendations of the [employer]. [facility] will   update reporting as new guidelines are published from the NKF-ASN   Task force.    GFR, Estimated  Date Value Ref Range Status  12/16/2023 >60 >60 mL/min Final    Comment:    (NOTE) Calculated using the CKD-EPI Creatinine Equation (2021)    eGFR  Date Value Ref Range Status  01/17/2023 85 >59 mL/min/1.73 Final         Passed - Patient is not pregnant      Passed - Valid  encounter within last 12 months    Recent Outpatient Visits           2 weeks ago Internal derangement of left knee   [place] [facility] at [facility], Dessie Flow, MD   2 months ago Essential hypertension   [facility] Primary Care & Sports Medicine at [facility], Chales [place], MD       Future Appointments             Today Sheron Dixons, MD [facility] Primary Care & Sports Medicine at [facility], [facility]   In 3 weeks Gennaro Khat, Cadence H, PA-C [facility] [place]   In 1 month Gala Jubilee, Chales [place], MD [facility] Primary Care & Sports Medicine at [facility], [facility]

## 2024-05-05 ENCOUNTER — Telehealth (HOSPITAL_COMMUNITY): Payer: Self-pay | Admitting: Emergency Medicine

## 2024-05-05 NOTE — Telephone Encounter (Signed)
 Reaching out to patient to offer assistance regarding upcoming cardiac imaging study; pt verbalizes understanding of appt date/time, parking situation and where to check in, pre-test NPO status and medications ordered, and verified current allergies; name and call back number provided for further questions should they arise Rockwell Alexandria RN Navigator Cardiac Imaging [facility] [phone] office [phone] cell

## 2024-05-06 ENCOUNTER — Ambulatory Visit: Admission: RE | Admit: 2024-05-06 | Source: Ambulatory Visit

## 2024-05-09 ENCOUNTER — Other Ambulatory Visit: Payer: Self-pay | Admitting: Internal Medicine

## 2024-05-09 DIAGNOSIS — R609 Edema, unspecified: Secondary | ICD-10-CM

## 2024-05-09 DIAGNOSIS — F324 Major depressive disorder, single episode, in partial remission: Secondary | ICD-10-CM

## 2024-05-10 NOTE — Patient Instructions (Signed)
 Patient Action Plan  1. Knee Brace: Continue using your knee brace and try to wean off it in two weeks if symptoms improve.  2. Pain Relief: Use Aleve as needed for pain management.  3. Symptom Monitoring: Keep an eye on your symptoms and report any lack of improvement after one month.  4. Possible MRI: Consider scheduling an MRI if symptoms persist after one month.  5. Activity Level: Maintain your normal activities but monitor for any changes in symptoms.  Red Flags: Seek medical attention if you experience severe pain, swelling, or any new symptoms that significantly impact your daily activities.

## 2024-05-10 NOTE — Progress Notes (Signed)
     Primary Care / Sports Medicine Office Visit  Patient Information:  Patient ID: Leonardo Plaia, female DOB: Jul 25, 1966 Age: 58 y.o. MRN: [id number]   Havyn Ramo is a pleasant 58 y.o. female presenting with the following:  Chief Complaint  Patient presents with   Knee Pain    2 week f/up    Vitals:   04/27/24 1520  BP: 110/70  Pulse: 84  SpO2: 98%   Vitals:   04/27/24 1520  Weight: 184 lb 6 oz (83.6 kg)  Height: 5\' 3"  (1.6 m)   Body mass index is 32.66 kg/m.     Independent interpretation of notes and tests performed by another provider:   None  Procedures performed:   None  Pertinent History, Exam, Impression, and Recommendations:   Problem List Items Addressed This Visit     Internal derangement of left knee - Primary   History of Present Illness Juhi Konkel is a 58 year old female who presents with left knee pain following a prior injection and fluid aspiration.  She experiences intermittent stabbing pain in the inner aspect of the left knee, particularly when walking or standing for extended periods. The pain is alleviated by adjusting the position of her foot. She wears a knee brace tightly, which helps manage her symptoms. She is concerned about the potential for her knee to give out, although she has not experienced any episodes of instability. Previously prescribed medications are ineffective, but she finds relief with three Aleve tablets as needed.  Physical Exam INSPECTION: Left knee inspection reveals no gross abnormalities or overt swelling. PALPATION: Trace effusion in the left knee. Non-tender patella facets and patellar quadriceps tendon. Lateral joint line non-tender with acute tenderness at the anteromedial aspect of the left knee. RANGE OF MOTION: Left knee range of motion from 0 to 130 degrees with painless terminal flexion. SPECIAL TESTS: Negative medial and lateral myrtitis tests.  Assessment and Plan Left knee pain with possible  bone edema Intermittent anteromedial knee pain, likely due to bone edema or cartilage inflammation. Acute meniscus involvement less likely. Aleve effective for pain management. - Continue knee brace, wean off in two weeks if symptoms improve. - Use Aleve PRN for pain. - Monitor symptoms, report if no improvement in a month. - Consider MRI if symptoms persist in a month. - Maintain normal activities, monitor symptom changes.        Orders & Medications Medications: No orders of the defined types were placed in this encounter.  No orders of the defined types were placed in this encounter.    No follow-ups on file.     Ma Saupe, MD, [facility]   Primary Care Sports Medicine Primary Care and Sports Medicine at [facility]

## 2024-05-10 NOTE — Assessment & Plan Note (Signed)
 History of Present Illness Nancy Skinner is a 58 year old female who presents with left knee pain following a prior injection and fluid aspiration.  She experiences intermittent stabbing pain in the inner aspect of the left knee, particularly when walking or standing for extended periods. The pain is alleviated by adjusting the position of her foot. She wears a knee brace tightly, which helps manage her symptoms. She is concerned about the potential for her knee to give out, although she has not experienced any episodes of instability. Previously prescribed medications are ineffective, but she finds relief with three Aleve tablets as needed.  Physical Exam INSPECTION: Left knee inspection reveals no gross abnormalities or overt swelling. PALPATION: Trace effusion in the left knee. Non-tender patella facets and patellar quadriceps tendon. Lateral joint line non-tender with acute tenderness at the anteromedial aspect of the left knee. RANGE OF MOTION: Left knee range of motion from 0 to 130 degrees with painless terminal flexion. SPECIAL TESTS: Negative medial and lateral myrtitis tests.  Assessment and Plan Left knee pain with possible bone edema Intermittent anteromedial knee pain, likely due to bone edema or cartilage inflammation. Acute meniscus involvement less likely. Aleve effective for pain management. - Continue knee brace, wean off in two weeks if symptoms improve. - Use Aleve PRN for pain. - Monitor symptoms, report if no improvement in a month. - Consider MRI if symptoms persist in a month. - Maintain normal activities, monitor symptom changes.

## 2024-05-12 NOTE — Telephone Encounter (Signed)
 Requested Prescriptions  Pending Prescriptions Disp Refills   furosemide  (LASIX ) 20 MG tablet [Pharmacy Med Name: FUROSEMIDE  20 MG TABLET] 90 tablet 0    Sig: TAKE 1 TABLET BY MOUTH EVERY DAY     Cardiovascular:  Diuretics - Loop Failed - 05/12/2024  2:37 PM      Failed - Mg Level in normal range and within 180 days    No results found for: "MG"       Passed - K in normal range and within 180 days    Potassium  Date Value Ref Range Status  12/16/2023 3.7 3.5 - 5.1 mmol/L Final         Passed - Ca in normal range and within 180 days    Calcium   Date Value Ref Range Status  12/16/2023 9.1 8.9 - 10.3 mg/dL Final         Passed - Na in normal range and within 180 days    Sodium  Date Value Ref Range Status  12/16/2023 135 135 - 145 mmol/L Final  01/17/2023 138 134 - 144 mmol/L Final         Passed - Cr in normal range and within 180 days    Creatinine, Ser  Date Value Ref Range Status  12/16/2023 0.91 0.44 - 1.00 mg/dL Final         Passed - Cl in normal range and within 180 days    Chloride  Date Value Ref Range Status  12/16/2023 101 98 - 111 mmol/L Final         Passed - Last BP in normal range    BP Readings from Last 1 Encounters:  04/27/24 110/70         Passed - Valid encounter within last 6 months    Recent Outpatient Visits           2 weeks ago Internal derangement of left knee   [place] [facility] at [facility], Dessie Flow, MD   4 weeks ago Internal derangement of left knee   [place] [facility], Dessie Flow, MD   2 months ago Essential hypertension   [place] [facility] at [facility], Chales Colorado, MD       Future Appointments             In 6 days Gennaro Khat, Cadence H, PA-C [facility] [place]   In 1 month Gala Jubilee, Chales [place], MD [facility] at [facility], [facility]              buPROPion  (WELLBUTRIN  XL) 300 MG 24 hr tablet [Pharmacy Med Name: BUPROPION  HCL XL 300 MG TABLET] 90 tablet 0    Sig: TAKE 1 TABLET BY MOUTH EVERY DAY     Psychiatry: Antidepressants - bupropion  Passed - 05/12/2024  2:37 PM      Passed - Cr in normal range and within 360 days    Creatinine, Ser  Date Value Ref Range Status  12/16/2023 0.91 0.44 - 1.00 mg/dL Final         Passed - AST in normal range and within 360 days    AST  Date Value Ref Range Status  12/16/2023 28 15 - 41 U/L Final         Passed - ALT in normal range and within 360 days    ALT  Date Value Ref Range  Status  12/16/2023 23 0 - 44 U/L Final         Passed - Completed PHQ-2 or PHQ-9 in the last 360 days      Passed - Last BP in normal range    BP Readings from Last 1 Encounters:  04/27/24 110/70         Passed - Valid encounter within last 6 months    Recent Outpatient Visits           2 weeks ago Internal derangement of left knee   [place] [facility] at [facility], Dessie Flow, MD   4 weeks ago Internal derangement of left knee   [facility] at [facility], Dessie Flow, MD   2 months ago Essential hypertension   [facility] at [facility], Chales [place], MD       Future Appointments             In 6 days Gennaro Khat, Cadence H, PA-C [place] [place]   In 1 month Gala Jubilee, Chales [place], MD [facility] at [facility], [facility]

## 2024-05-13 ENCOUNTER — Inpatient Hospital Stay: Admission: RE | Admit: 2024-05-13 | Source: Ambulatory Visit

## 2024-05-18 ENCOUNTER — Ambulatory Visit: Admitting: Medical

## 2024-05-18 NOTE — Progress Notes (Deleted)
  Cardiology Office Note   Date:  05/18/2024  ID:  Lyrika Souders, DOB February 01, 1966, MRN [id number] PCP: Sheron Dixons, MD  [facility] Providers Cardiologist:  Sammy Crisp, MD { Click to update primary MD,subspecialty MD or APP then REFRESH:1}    History of Present Illness Rayvon Ruacho is a 58 y.o. female with a hx of nonobstructive coronary disease, hyperlipidemia, migraine disorder, depression, tobacco use, and hypertension who presents for 12 month follow-up/chest pain.    She was admitted to the hospital 07/2018 with intermittent chest pain. Diagnostic cardiac catheterization with moderate nonobstructive LAD disease estimated at 60% with FFR 0.82, otherwise normal LCx and RCA. LVEF 55-65% with normal LVEDP and no aortic stenosis. She was recommended for medical therapy. Previous intolerance to Imdur  with headache. She did stop Atorvastatin  due to rash but she resumed and rash did not recur. Her Metoprolol  has been previously de-escalated to fatigue with improvement in symptoms.   The patient was last seen 03/16/24 reporting atypical chest pain related to stress. Cardiac PET   ROS: ***  Studies Reviewed      *** Risk Assessment/Calculations {Does this patient have ATRIAL FIBRILLATION?[phone]} No BP recorded.  {Refresh Note OR Click here to enter BP  :1}***       Physical Exam VS:  There were no vitals taken for this visit.   Wt Readings from Last 3 Encounters:  04/27/24 184 lb 6 oz (83.6 kg)  04/13/24 188 lb (85.3 kg)  03/16/24 189 lb (85.7 kg)    GEN: Well nourished, well developed in no acute distress NECK: No JVD; No carotid bruits CARDIAC: ***RRR, no murmurs, rubs, gallops RESPIRATORY:  Clear to auscultation without rales, wheezing or rhonchi  ABDOMEN: Soft, non-tender, non-distended EXTREMITIES:  No edema; No deformity   ASSESSMENT AND PLAN ***    {Are you ordering a CV Procedure (e.g. stress test, cath, DCCV, TEE, etc)?   Press F2         :[id number]}  Dispo: ***  Signed, Cassy Sprowl Rebekah Canada, PA-C

## 2024-05-29 ENCOUNTER — Other Ambulatory Visit: Payer: Self-pay | Admitting: Family Medicine

## 2024-05-29 DIAGNOSIS — M2392 Unspecified internal derangement of left knee: Secondary | ICD-10-CM

## 2024-05-31 ENCOUNTER — Encounter: Payer: Self-pay | Admitting: Internal Medicine

## 2024-06-01 ENCOUNTER — Other Ambulatory Visit: Payer: Self-pay | Admitting: Internal Medicine

## 2024-06-01 DIAGNOSIS — E118 Type 2 diabetes mellitus with unspecified complications: Secondary | ICD-10-CM

## 2024-06-01 MED ORDER — TIRZEPATIDE 15 MG/0.5ML ~~LOC~~ SOAJ: 15.0000 mg | SUBCUTANEOUS | 1 refills | Status: DC

## 2024-06-01 NOTE — Progress Notes (Unsigned)
 Date:  06/01/2024   Name:  Nancy Skinner   DOB:  1966/06/22   MRN:  [id number]   Chief Complaint: No chief complaint on file.  HPI  Review of Systems   Lab Results  Component Value Date   NA 135 12/16/2023   K 3.7 12/16/2023   CO2 24 12/16/2023   GLUCOSE 104 (H) 12/16/2023   BUN 14 12/16/2023   CREATININE 0.91 12/16/2023   CALCIUM  9.1 12/16/2023   EGFR 85 01/17/2023   GFRNONAA >60 12/16/2023   Lab Results  Component Value Date   CHOL 215 (H) 12/16/2023   HDL 43 12/16/2023   LDLCALC 104 (H) 12/16/2023   LDLDIRECT 48 09/02/2019   TRIG 340 (H) 12/16/2023   CHOLHDL 5.0 12/16/2023   Lab Results  Component Value Date   TSH 2.380 01/17/2023   Lab Results  Component Value Date   HGBA1C 6.1 (A) 02/13/2024   Lab Results  Component Value Date   WBC 9.5 01/17/2023   HGB 12.7 01/17/2023   HCT 37.8 01/17/2023   MCV 86 01/17/2023   PLT 341 01/17/2023   Lab Results  Component Value Date   ALT 23 12/16/2023   AST 28 12/16/2023   ALKPHOS 34 (L) 12/16/2023   BILITOT 0.2 12/16/2023   No results found for: Lucetta Russel, VD25OH   Patient Active Problem List   Diagnosis Date Noted   Internal derangement of left knee 04/13/2024   BMI 34.0-34.9,adult 03/05/2023   Type II diabetes mellitus with complication (HCC) 03/05/2023   PMR (polymyalgia rheumatica) (HCC) 02/26/2023   Status post hysterectomy 03/05/2022   Essential hypertension 04/16/2021   Hepatic steatosis 03/19/2021   Carpal tunnel syndrome of right wrist 07/10/2020   Coronary artery disease of native artery of native heart with stable angina pectoris (HCC) 01/18/2019   Current moderate episode of major depressive disorder without prior episode (HCC) 01/18/2019   Neutrophilic leukocytosis 08/09/2018   Pulmonary nodule less than 6 cm determined by computed tomography of lung 07/30/2018   Night sweats 07/16/2018   Neck muscle spasm 07/15/2018   Hyperlipidemia associated with type 2 diabetes  mellitus (HCC) 07/15/2018   Tobacco use disorder, moderate, in sustained remission 07/15/2018   Migraine without aura and without status migrainosus, not intractable 06/24/2018   Gastroesophageal reflux disease without esophagitis 06/24/2018   Chronic midline low back pain without sciatica 06/24/2018    Allergies  Allergen Reactions   Oxycodone Other (See Comments)    Black outs and confusion   Dye Fdc Red [Red Dye #40 (Allura Red)] Other (See Comments)    Severe migraine    Past Surgical History:  Procedure Laterality Date   ABDOMINAL HYSTERECTOMY  2012   partial for bleeding   BACK SURGERY     BREAST BIOPSY Right    neg   BREAST BIOPSY Left 2015   4 bxs- neg   BREAST SURGERY Left    multiple biopsies   CARDIAC CATHETERIZATION     CATARACT EXTRACTION Right 12/19/2010   CATARACT EXTRACTION Left    CHOLECYSTECTOMY  1999   COLONOSCOPY WITH PROPOFOL  N/A 03/31/2020   Procedure: COLONOSCOPY WITH PROPOFOL ;  Surgeon: Marnee Sink, MD;  Location: [facility] ENDOSCOPY;  Service: Endoscopy;  Laterality: N/A;   CORONARY PRESSURE/FFR STUDY N/A 07/22/2018   Procedure: INTRAVASCULAR PRESSURE WIRE/FFR STUDY;  Surgeon: Sammy Crisp, MD;  Location: [facility] INVASIVE CV LAB;  Service: Cardiovascular;  Laterality: N/A;   EYE SURGERY     INSERTION OF MESH  03/27/2023  Procedure: INSERTION OF MESH;  Surgeon: Alben Alma, MD;  Location: [facility] ORS;  Service: General;;   LEFT HEART CATH AND CORONARY ANGIOGRAPHY N/A 07/22/2018   Procedure: LEFT HEART CATH AND CORONARY ANGIOGRAPHY;  Surgeon: Sammy Crisp, MD;  Location: [facility] INVASIVE CV LAB;  Service: Cardiovascular;  Laterality: N/A;   POSTERIOR LUMBAR FUSION 4 LEVEL  2000   l 4-5 fusion   VENTRAL HERNIA REPAIR N/A 03/27/2023   Procedure: HERNIA REPAIR VENTRAL ADULT, open;  Surgeon: Alben Alma, MD;  Location: [facility] ORS;  Service: General;  Laterality: N/A;    Social History   Tobacco Use   Smoking status: Former    Current packs/day: 0.00     Average packs/day: 0.3 packs/day for 40.0 years (10.0 ttl pk-yrs)    Types: Cigarettes    Start date: 09/18/1979    Quit date: 09/18/2019    Years since quitting: 4.7    Passive exposure: Past   Smokeless tobacco: Never  Vaping Use   Vaping status: Never Used  Substance Use Topics   Alcohol use: Not Currently    Comment: less than once a week.   Drug use: Never     Medication list has been reviewed and updated.  No outpatient medications have been marked as taking for the 06/01/24 encounter (Orders Only) with Sheron Dixons, MD.       04/27/2024    3:27 PM 02/13/2024    3:05 PM 12/16/2023   11:26 AM 06/24/2023    2:25 PM  GAD 7 : Generalized Anxiety Score  Nervous, Anxious, on Edge 3 3 3  0  Control/stop worrying 3 3 3  0  Worry too much - different things 3 3 3  0  Trouble relaxing 3 3 3  0  Restless 3 3 3  0  Easily annoyed or irritable 3 3 3  0  Afraid - awful might happen 3 3 3  0  Total GAD 7 Score 21 21 21  0  Anxiety Difficulty Extremely difficult Extremely difficult Extremely difficult Not difficult at all       04/27/2024    3:26 PM 02/13/2024    3:04 PM 12/16/2023   11:25 AM  Depression screen PHQ 2/9  Decreased Interest 3 3 3   Down, Depressed, Hopeless 3 3 3   PHQ - 2 Score 6 6 6   Altered sleeping 3 3 3   Tired, decreased energy 3 3 3   Change in appetite 3 3 3   Feeling bad or failure about yourself  3 3 3   Trouble concentrating 1 1 1   Moving slowly or fidgety/restless 0 0 0  Suicidal thoughts 0 0 0  PHQ-9 Score 19 19 19   Difficult doing work/chores Not difficult at all Very difficult     BP Readings from Last 3 Encounters:  04/27/24 110/70  04/13/24 94/66  03/16/24 126/70    Physical Exam  Wt Readings from Last 3 Encounters:  04/27/24 184 lb 6 oz (83.6 kg)  04/13/24 188 lb (85.3 kg)  03/16/24 189 lb (85.7 kg)    There were no vitals taken for this visit.  Assessment and Plan:  Problem List Items Addressed This Visit   None   No  follow-ups on file.    Sheron Dixons, MD [facility] [place]

## 2024-06-01 NOTE — Telephone Encounter (Signed)
 Requested medication (s) are due for refill today: Yes  Requested medication (s) are on the active medication list: Yes  Last refill:  04/13/24  Future visit scheduled: Yes  Notes to clinic:  Unable to refill per protocol due to failed labs, no updated results.      Requested Prescriptions  Pending Prescriptions Disp Refills   diclofenac  (VOLTAREN ) 50 MG EC tablet [Pharmacy Med Name: DICLOFENAC  SOD EC 50 MG TAB] 30 tablet 0    Sig: TAKE 1 TABLET BY MOUTH 2 TIMES DAILY AS NEEDED.     Analgesics:  NSAIDS Failed - 06/01/2024 10:58 AM      Failed - Manual Review: Labs are only required if the patient has taken medication for more than 8 weeks.      Failed - HGB in normal range and within 360 days    Hemoglobin  Date Value Ref Range Status  01/17/2023 12.7 11.1 - 15.9 g/dL Final         Failed - PLT in normal range and within 360 days    Platelets  Date Value Ref Range Status  01/17/2023 341 150 - 450 x10E3/uL Final         Failed - HCT in normal range and within 360 days    Hematocrit  Date Value Ref Range Status  01/17/2023 37.8 34.0 - 46.6 % Final         Passed - Cr in normal range and within 360 days    Creatinine, Ser  Date Value Ref Range Status  12/16/2023 0.91 0.44 - 1.00 mg/dL Final         Passed - eGFR is 30 or above and within 360 days    GFR calc Af Amer  Date Value Ref Range Status  09/11/2020 100 >59 mL/min/1.73 Final    Comment:    **[facility] currently reports eGFR in compliance with the current**   recommendations of the [employer]. [facility] will   update reporting as new guidelines are published from the NKF-ASN   Task force.    GFR, Estimated  Date Value Ref Range Status  12/16/2023 >60 >60 mL/min Final    Comment:    (NOTE) Calculated using the CKD-EPI Creatinine Equation (2021)    eGFR  Date Value Ref Range Status  01/17/2023 85 >59 mL/min/1.73 Final         Passed - Patient is not pregnant      Passed - Valid  encounter within last 12 months    Recent Outpatient Visits           1 month ago Internal derangement of left knee   [place] [facility], Dessie Flow, MD   1 month ago Internal derangement of left knee   [facility] at [facility], Dessie Flow, MD   3 months ago Essential hypertension   [facility] at [facility], Chales [place], MD       Future Appointments             In 1 week Gala Jubilee, Chales [place], MD [facility] at [facility], [facility]   In 3 weeks Gennaro Khat, Cadence H, PA-C [facility] [place]

## 2024-06-14 ENCOUNTER — Encounter: Payer: 59 | Admitting: Internal Medicine

## 2024-06-14 ENCOUNTER — Encounter: Payer: Self-pay | Admitting: Internal Medicine

## 2024-06-14 NOTE — Progress Notes (Deleted)
 Date:  06/14/2024   Name:  Nancy Skinner   DOB:  Sep 14, 1966   MRN:  [id number]   Chief Complaint: No chief complaint on file. Nancy Skinner is a 58 y.o. female who presents today for her Complete Annual Exam. She feels {DESC; WELL/FAIRLY WELL/POORLY:18703}. She reports exercising ***. She reports she is sleeping {DESC; WELL/FAIRLY WELL/POORLY:18703}. Breast complaints ***.  Health Maintenance  Topic Date Due   Eye exam for diabetics  Never done   DTaP/Tdap/Td vaccine (1 - Tdap) Never done   Pneumococcal Vaccination (1 of 2 - PCV) Never done   Hepatitis B Vaccine (1 of 3 - 19+ 3-dose series) Never done   Zoster (Shingles) Vaccine (1 of 2) Never done   COVID-19 Vaccine (3 - Moderna risk series) 11/24/2020   Complete foot exam   05/01/2024   Mammogram  05/12/2024   HIV Screening  12/15/2024*   Flu Shot  07/16/2024   Hemoglobin A1C  08/12/2024   Yearly kidney function blood test for diabetes  12/15/2024   Yearly kidney health urinalysis for diabetes  12/15/2024   Colon Cancer Screening  03/31/2030   Hepatitis C Screening  Completed   HPV Vaccine  Aged Out   Meningitis B Vaccine  Aged Out  *Topic was postponed. The date shown is not the original due date.    Hypertension This is a chronic problem. The problem is controlled. Pertinent negatives include no chest pain, headaches, palpitations or shortness of breath. Past treatments include diuretics. The current treatment provides significant improvement. Hypertensive end-organ damage includes CAD/MI. There is no history of kidney disease or CVA.  Diabetes She presents for her follow-up diabetic visit. She has type 2 diabetes mellitus. Pertinent negatives for hypoglycemia include no dizziness, headaches, nervousness/anxiousness or tremors. Pertinent negatives for diabetes include no chest pain, no fatigue, no polydipsia and no polyuria. Pertinent negatives for diabetic complications include no CVA. Current diabetic treatments: Mounjaro   15 mg. An ACE inhibitor/angiotensin II receptor blocker is not being taken. Eye exam is not current.  Hyperlipidemia This is a chronic problem. Recent lipid tests were reviewed and are high. Pertinent negatives include no chest pain or shortness of breath. Current antihyperlipidemic treatment includes statins and fibric acid derivatives. The current treatment provides significant improvement of lipids.  Depression        This is a chronic problem.The problem is unchanged.  Associated symptoms include no fatigue and no headaches.  Past treatments include SSRIs - Selective serotonin reuptake inhibitors and other medications (Lexapro , Vraylar , Bupropion ).  Compliance with treatment is good.   Review of Systems  Constitutional:  Negative for chills, fatigue and fever.  HENT:  Negative for congestion, hearing loss, tinnitus, trouble swallowing and voice change.   Eyes:  Negative for visual disturbance.  Respiratory:  Negative for cough, chest tightness, shortness of breath and wheezing.   Cardiovascular:  Negative for chest pain, palpitations and leg swelling.  Gastrointestinal:  Negative for abdominal pain, constipation, diarrhea and vomiting.  Endocrine: Negative for polydipsia and polyuria.  Genitourinary:  Negative for dysuria, frequency, genital sores, vaginal bleeding and vaginal discharge.  Musculoskeletal:  Negative for arthralgias, gait problem and joint swelling.  Skin:  Negative for color change and rash.  Neurological:  Negative for dizziness, tremors, light-headedness and headaches.  Hematological:  Negative for adenopathy. Does not bruise/bleed easily.  Psychiatric/Behavioral:  Positive for depression. Negative for dysphoric mood and sleep disturbance. The patient is not nervous/anxious.      Lab Results  Component Value  Date   NA 135 12/16/2023   K 3.7 12/16/2023   CO2 24 12/16/2023   GLUCOSE 104 (H) 12/16/2023   BUN 14 12/16/2023   CREATININE 0.91 12/16/2023   CALCIUM  9.1  12/16/2023   EGFR 85 01/17/2023   GFRNONAA >60 12/16/2023   Lab Results  Component Value Date   CHOL 215 (H) 12/16/2023   HDL 43 12/16/2023   LDLCALC 104 (H) 12/16/2023   LDLDIRECT 48 09/02/2019   TRIG 340 (H) 12/16/2023   CHOLHDL 5.0 12/16/2023   Lab Results  Component Value Date   TSH 2.380 01/17/2023   Lab Results  Component Value Date   HGBA1C 6.1 (A) 02/13/2024   Lab Results  Component Value Date   WBC 9.5 01/17/2023   HGB 12.7 01/17/2023   HCT 37.8 01/17/2023   MCV 86 01/17/2023   PLT 341 01/17/2023   Lab Results  Component Value Date   ALT 23 12/16/2023   AST 28 12/16/2023   ALKPHOS 34 (L) 12/16/2023   BILITOT 0.2 12/16/2023   No results found for: MARIEN BOLLS, VD25OH   Patient Active Problem List   Diagnosis Date Noted   Internal derangement of left knee 04/13/2024   BMI 34.0-34.9,adult 03/05/2023   Type II diabetes mellitus with complication (HCC) 03/05/2023   PMR (polymyalgia rheumatica) (HCC) 02/26/2023   Status post hysterectomy 03/05/2022   Essential hypertension 04/16/2021   Hepatic steatosis 03/19/2021   Carpal tunnel syndrome of right wrist 07/10/2020   Coronary artery disease of native artery of native heart with stable angina pectoris (HCC) 01/18/2019   Current moderate episode of major depressive disorder without prior episode (HCC) 01/18/2019   Neutrophilic leukocytosis 08/09/2018   Pulmonary nodule less than 6 cm determined by computed tomography of lung 07/30/2018   Night sweats 07/16/2018   Neck muscle spasm 07/15/2018   Hyperlipidemia associated with type 2 diabetes mellitus (HCC) 07/15/2018   Tobacco use disorder, moderate, in sustained remission 07/15/2018   Migraine without aura and without status migrainosus, not intractable 06/24/2018   Gastroesophageal reflux disease without esophagitis 06/24/2018   Chronic midline low back pain without sciatica 06/24/2018    Allergies  Allergen Reactions   Oxycodone Other  (See Comments)    Black outs and confusion   Dye Fdc Red [Red Dye #40 (Allura Red)] Other (See Comments)    Severe migraine    Past Surgical History:  Procedure Laterality Date   ABDOMINAL HYSTERECTOMY  2012   partial for bleeding   BACK SURGERY     BREAST BIOPSY Right    neg   BREAST BIOPSY Left 2015   4 bxs- neg   BREAST SURGERY Left    multiple biopsies   CARDIAC CATHETERIZATION     CATARACT EXTRACTION Right 12/19/2010   CATARACT EXTRACTION Left    CHOLECYSTECTOMY  1999   COLONOSCOPY WITH PROPOFOL  N/A 03/31/2020   Procedure: COLONOSCOPY WITH PROPOFOL ;  Surgeon: Jinny Carmine, MD;  Location: [facility] END[facility];  Service: Endoscopy;  Laterality: N/A;   CORONARY PRESSURE/FFR STUDY N/A 07/22/2018   Procedure: INTRAVASCULAR PRESSURE WIRE/FFR STUDY;  Surgeon: Mady Bruckner, MD;  Location: [facility];  Service: Cardiovascular;  Laterality: N/A;   EYE SURGERY     INSERTION OF MESH  03/27/2023   Procedure: INSERTION OF MESH;  Surgeon: Jordis Laneta FALCON, MD;  Location: [facility] ORS;  Service: General;;   LEFT HEART CATH AND CORONARY ANGIOGRAPHY N/A 07/22/2018   Procedure: LEFT HEART CATH AND CORONARY ANGIOGRAPHY;  Surgeon: Mady Bruckner, MD;  Location: [facility] CV [facility];  Service: Cardiovascular;  Laterality: N/A;   POSTERIOR LUMBAR FUSION 4 LEVEL  2000   l 4-5 fusion   VENTRAL HERNIA REPAIR N/A 03/27/2023   Procedure: HERNIA REPAIR VENTRAL ADULT, open;  Surgeon: Jordis Laneta FALCON, MD;  Location: [facility] ORS;  Service: General;  Laterality: N/A;    Social History   Tobacco Use   Smoking status: Former    Current packs/day: 0.00    Average packs/day: 0.3 packs/day for 40.0 years (10.0 ttl pk-yrs)    Types: Cigarettes    Start date: 09/18/1979    Quit date: 09/18/2019    Years since quitting: 4.7    Passive exposure: Past   Smokeless tobacco: Never  Vaping Use   Vaping status: Never Used  Substance Use Topics   Alcohol use: Not Currently    Comment: less than once a week.    Drug use: Never     Medication list has been reviewed and updated.  No outpatient medications have been marked as taking for the 06/14/24 encounter (Appointment) with Justus Leita DEL, MD.       04/27/2024    3:27 PM 02/13/2024    3:05 PM 12/16/2023   11:26 AM 06/24/2023    2:25 PM  GAD 7 : Generalized Anxiety Score  Nervous, Anxious, on Edge 3 3 3  0  Control/stop worrying 3 3 3  0  Worry too much - different things 3 3 3  0  Trouble relaxing 3 3 3  0  Restless 3 3 3  0  Easily annoyed or irritable 3 3 3  0  Afraid - awful might happen 3 3 3  0  Total GAD 7 Score 21 21 21  0  Anxiety Difficulty Extremely difficult Extremely difficult Extremely difficult Not difficult at all       04/27/2024    3:26 PM 02/13/2024    3:04 PM 12/16/2023   11:25 AM  Depression screen PHQ 2/9  Decreased Interest 3 3 3   Down, Depressed, Hopeless 3 3 3   PHQ - 2 Score 6 6 6   Altered sleeping 3 3 3   Tired, decreased energy 3 3 3   Change in appetite 3 3 3   Feeling bad or failure about yourself  3 3 3   Trouble concentrating 1 1 1   Moving slowly or fidgety/restless 0 0 0  Suicidal thoughts 0 0 0  PHQ-9 Score 19 19 19   Difficult doing work/chores Not difficult at all Very difficult     BP Readings from Last 3 Encounters:  04/27/24 110/70  04/13/24 94/66  03/16/24 126/70    Physical Exam Vitals and nursing note reviewed.  Constitutional:      General: She is not in acute distress.    Appearance: She is well-developed.  HENT:     Head: Normocephalic and atraumatic.     Right Ear: Tympanic membrane and ear canal normal.     Left Ear: Tympanic membrane and ear canal normal.     Nose:     Right Sinus: No maxillary sinus tenderness.     Left Sinus: No maxillary sinus tenderness.   Eyes:     General: No scleral icterus.       Right eye: No discharge.        Left eye: No discharge.     Conjunctiva/sclera: Conjunctivae normal.   Neck:     Thyroid: No thyromegaly.     Vascular: No carotid bruit.    Cardiovascular:     Rate and Rhythm: Normal rate  and regular rhythm.     Pulses: Normal pulses.     Heart sounds: Normal heart sounds.  Pulmonary:     Effort: Pulmonary effort is normal. No respiratory distress.     Breath sounds: No wheezing.  Abdominal:     General: Bowel sounds are normal.     Palpations: Abdomen is soft.     Tenderness: There is no abdominal tenderness.   Musculoskeletal:     Cervical back: Normal range of motion. No erythema.     Right lower leg: No edema.     Left lower leg: No edema.  Lymphadenopathy:     Cervical: No cervical adenopathy.   Skin:    General: Skin is warm and dry.     Findings: No rash.   Neurological:     Mental Status: She is alert and oriented to person, place, and time.     Cranial Nerves: No cranial nerve deficit.     Sensory: No sensory deficit.     Deep Tendon Reflexes: Reflexes are normal and symmetric.   Psychiatric:        Attention and Perception: Attention normal.        Mood and Affect: Mood normal.    Diabetic Foot Exam - Simple   No data filed      Wt Readings from Last 3 Encounters:  04/27/24 184 lb 6 oz (83.6 kg)  04/13/24 188 lb (85.3 kg)  03/16/24 189 lb (85.7 kg)    There were no vitals taken for this visit.  Assessment and Plan:  Problem List Items Addressed This Visit       Unprioritized   Essential hypertension (Chronic)   Hyperlipidemia associated with type 2 diabetes mellitus (HCC)   Type II diabetes mellitus with complication (HCC)   Current moderate episode of major depressive disorder without prior episode [facility])   Other Visit Diagnoses       Annual physical exam    -  Primary     Encounter for screening mammogram for breast cancer           No follow-ups on file.    Leita HILARIO Adie, MD [facility] [place]

## 2024-06-15 ENCOUNTER — Encounter (HOSPITAL_COMMUNITY): Payer: Self-pay

## 2024-06-17 ENCOUNTER — Ambulatory Visit
Admission: RE | Admit: 2024-06-17 | Discharge: 2024-06-17 | Disposition: A | Discharge status: Home / Self Care | Source: Ambulatory Visit | Attending: Medical | Admitting: Medical

## 2024-06-17 DIAGNOSIS — R079 Chest pain, unspecified: Secondary | ICD-10-CM

## 2024-06-17 DIAGNOSIS — I2510 Atherosclerotic heart disease of native coronary artery without angina pectoris: Secondary | ICD-10-CM | POA: Insufficient documentation

## 2024-06-17 MED ORDER — RUBIDIUM RB82 GENERATOR (RUBYFILL)
25.0000 | PACK | Freq: Once | INTRAVENOUS | Status: AC
  Administered 2024-06-17: 21.26 via INTRAVENOUS

## 2024-06-17 MED ORDER — REGADENOSON 0.4 MG/5ML IV SOLN
INTRAVENOUS | Status: AC
  Filled 2024-06-17: qty 5

## 2024-06-17 MED ORDER — RUBIDIUM RB82 GENERATOR (RUBYFILL)
25.0000 | PACK | Freq: Once | INTRAVENOUS | Status: AC
  Administered 2024-06-17: 21.07 via INTRAVENOUS

## 2024-06-17 MED ORDER — REGADENOSON 0.4 MG/5ML IV SOLN
0.4000 mg | Freq: Once | INTRAVENOUS | Status: AC
  Administered 2024-06-17: 0.4 mg via INTRAVENOUS
  Filled 2024-06-17: qty 5

## 2024-06-17 NOTE — Progress Notes (Signed)
 Patient presents for a cardiac PET stress test and tolerated procedure without incident. Patient maintained acceptable vital signs throughout the test and was offered caffeine after test.  Patient ambulated out of department with a steady gait.

## 2024-06-18 LAB — NM PET CT CARDIAC PERFUSION MULTI W/ABSOLUTE BLOODFLOW
LV dias vol: 77 mL (ref 46–106)
LV sys vol: 22 mL (ref 3.8–5.2)
MBFR: 2.96
Nuc Rest EF: 70 %
Nuc Stress EF: 71 %
Peak HR: 90 {beats}/min
Rest HR: 77 {beats}/min
Rest MBF: 0.72 ml/g/min
Rest Nuclear Isotope Dose: 21.1 mCi
SRS: 0
SSS: 0
Stress MBF: 2.13 ml/g/min
Stress Nuclear Isotope Dose: 21.3 mCi
TID: 1.19

## 2024-06-23 ENCOUNTER — Ambulatory Visit: Payer: Self-pay | Admitting: Medical

## 2024-06-28 ENCOUNTER — Ambulatory Visit: Attending: Medical | Admitting: Medical

## 2024-06-28 NOTE — Progress Notes (Deleted)
  Cardiology Office Note   Date:  06/28/2024  ID:  Nancy Skinner, DOB 1966/03/21, MRN [id number] PCP: Nancy Leita DEL, MD  [facility] Providers Cardiologist:  Nancy Hanson, MD { Click to update primary MD,subspecialty MD or APP then REFRESH:1}    History of Present Illness Nancy Skinner is a 58 y.o. female  with a hx of nonobstructive coronary disease, hyperlipidemia, migraine disorder, depression, tobacco use, and hypertension who presents for 12 month follow-up/chest pain.    She was admitted to the hospital 07/2018 with intermittent chest pain. Diagnostic cardiac catheterization with moderate nonobstructive LAD disease estimated at 60% with FFR 0.82, otherwise normal LCx and RCA. LVEF 55-65% with normal LVEDP and no aortic stenosis. She was recommended for medical therapy. Previous intolerance to Imdur  with headache. She did stop Atorvastatin  due to rash but she resumed and rash did not recur. Her Metoprolol  has been previously de-escalated to fatigue with improvement in symptoms.    The patient was last seen 03/16/24 reporting atypical chest pain possible related to stress. Cardiac PET stress was normal and low risk.  Today,      ROS: ***  Studies Reviewed      *** Risk Assessment/Calculations {Does this patient have ATRIAL FIBRILLATION?:[phone]} No BP recorded.  {Refresh Note OR Click here to enter BP  :1}***       Physical Exam VS:  There were no vitals taken for this visit.       Wt Readings from Last 3 Encounters:  04/27/24 184 lb 6 oz (83.6 kg)  04/13/24 188 lb (85.3 kg)  03/16/24 189 lb (85.7 kg)    GEN: Well nourished, well developed in no acute distress NECK: No JVD; No carotid bruits CARDIAC: ***RRR, no murmurs, rubs, gallops RESPIRATORY:  Clear to auscultation without rales, wheezing or rhonchi  ABDOMEN: Soft, non-tender, non-distended EXTREMITIES:  No edema; No deformity   ASSESSMENT AND PLAN ***    {Are you ordering a CV Procedure  (e.g. stress test, cath, DCCV, TEE, etc)?   Press F2        :[id number]}  Dispo: ***  Signed, Nancy [place] Skinner Fishman, PA-C

## 2024-07-04 ENCOUNTER — Other Ambulatory Visit: Payer: Self-pay | Admitting: Medical

## 2024-07-07 ENCOUNTER — Encounter: Payer: Self-pay | Admitting: Internal Medicine

## 2024-07-09 ENCOUNTER — Encounter: Payer: Self-pay | Admitting: Internal Medicine

## 2024-07-09 ENCOUNTER — Ambulatory Visit: Admitting: Internal Medicine

## 2024-07-09 VITALS — BP 128/72 | HR 72 | Ht 63.0 in | Wt 194.0 lb

## 2024-07-09 DIAGNOSIS — Z7985 Long-term (current) use of injectable non-insulin antidiabetic drugs: Secondary | ICD-10-CM

## 2024-07-09 DIAGNOSIS — E118 Type 2 diabetes mellitus with unspecified complications: Secondary | ICD-10-CM | POA: Diagnosis not present

## 2024-07-09 DIAGNOSIS — E1169 Type 2 diabetes mellitus with other specified complication: Secondary | ICD-10-CM

## 2024-07-09 DIAGNOSIS — Z1231 Encounter for screening mammogram for malignant neoplasm of breast: Secondary | ICD-10-CM

## 2024-07-09 DIAGNOSIS — I10 Essential (primary) hypertension: Secondary | ICD-10-CM | POA: Diagnosis not present

## 2024-07-09 DIAGNOSIS — M545 Low back pain, unspecified: Secondary | ICD-10-CM

## 2024-07-09 DIAGNOSIS — I25118 Atherosclerotic heart disease of native coronary artery with other forms of angina pectoris: Secondary | ICD-10-CM | POA: Diagnosis not present

## 2024-07-09 DIAGNOSIS — E785 Hyperlipidemia, unspecified: Secondary | ICD-10-CM

## 2024-07-09 DIAGNOSIS — Z6834 Body mass index (BMI) 34.0-34.9, adult: Secondary | ICD-10-CM

## 2024-07-09 DIAGNOSIS — F321 Major depressive disorder, single episode, moderate: Secondary | ICD-10-CM

## 2024-07-09 DIAGNOSIS — G8929 Other chronic pain: Secondary | ICD-10-CM

## 2024-07-09 LAB — POCT GLYCOSYLATED HEMOGLOBIN (HGB A1C): Hemoglobin A1C: 5.8 % — AB (ref 4.0–5.6)

## 2024-07-09 MED ORDER — HYDROCHLOROTHIAZIDE 25 MG PO TABS: 25.0000 mg | ORAL_TABLET | Freq: Every day | ORAL | 1 refills | Status: AC

## 2024-07-09 MED ORDER — CARIPRAZINE HCL 1.5 MG PO CAPS: 1.5000 mg | ORAL_CAPSULE | Freq: Every day | ORAL | 1 refills | Status: AC

## 2024-07-09 MED ORDER — CYCLOBENZAPRINE HCL 10 MG PO TABS
ORAL_TABLET | ORAL | 0 refills | Status: DC
Start: 2024-07-09 — End: 2024-09-03

## 2024-07-09 MED ORDER — TIRZEPATIDE 15 MG/0.5ML ~~LOC~~ SOAJ: 15.0000 mg | SUBCUTANEOUS | 5 refills | Status: DC

## 2024-07-09 MED ORDER — CLONAZEPAM 0.25 MG PO TBDP: 0.2500 mg | ORAL_TABLET | Freq: Two times a day (BID) | ORAL | 0 refills | Status: DC | PRN

## 2024-07-09 NOTE — Assessment & Plan Note (Signed)
 On Crestor  Lab Results  Component Value Date   LDLCALC 104 (H) 12/16/2023

## 2024-07-09 NOTE — Assessment & Plan Note (Addendum)
 Blood sugars have been stable.  No hypoglycemic events since last visit. Currently medications are MTF and Mounjaro . Last visit medical regimen changes were none. Lab Results  Component Value Date   HGBA1C 6.1 (A) 02/13/2024  A1C today = 5.8.  however, she is not losing weight like she did initially.  She is on max dose Mounjaro  - had lost excessive weight on Ozempic .

## 2024-07-09 NOTE — Assessment & Plan Note (Signed)
 LDL is  Lab Results  Component Value Date   LDLCALC 104 (H) 12/16/2023   Currently taking Crestor   No medication side effects or other concerns. Recommended LDL goal is < 55.

## 2024-07-09 NOTE — Assessment & Plan Note (Addendum)
 Clinically stable on Lexapro , Bupropion  and Vraylar  3 mg..   No SI or HI on evaluation. High PHQ and GAD7 scores. Reduce Vraylar  to 1.5 mg since no benefit to higher dose. Hopefully getting her grandson into the [employer] in [place] will give her a break.

## 2024-07-09 NOTE — Assessment & Plan Note (Addendum)
 Continue PRN flexeril  and ibuprofen .

## 2024-07-09 NOTE — Progress Notes (Signed)
 Date:  07/09/2024   Name:  Nancy Skinner   DOB:  28-Aug-1966   MRN:  [id number]   Chief Complaint: Diabetes and Hypertension  Diabetes She presents for her follow-up diabetic visit. She has type 2 diabetes mellitus. Her disease course has been stable. Hypoglycemia symptoms include nervousness/anxiousness. Pertinent negatives for hypoglycemia include no dizziness or headaches. Pertinent negatives for diabetes include no chest pain, no fatigue and no weakness. Symptoms are stable. Pertinent negatives for diabetic complications include no CVA.  Depression        This is a chronic problem.The problem is unchanged.  Associated symptoms include no fatigue, no myalgias, no headaches and no suicidal ideas. Hypertension This is a chronic problem. The problem is controlled. Pertinent negatives include no chest pain, headaches, palpitations or shortness of breath. Hypertensive end-organ damage includes CAD/MI. There is no history of kidney disease or CVA.    Review of Systems  Constitutional:  Negative for fatigue and unexpected weight change.  HENT:  Negative for trouble swallowing.   Eyes:  Negative for visual disturbance.  Respiratory:  Negative for cough, chest tightness, shortness of breath and wheezing.   Cardiovascular:  Negative for chest pain, palpitations and leg swelling.  Gastrointestinal:  Negative for abdominal pain, constipation and diarrhea.  Genitourinary:  Negative for frequency, hematuria and urgency.  Musculoskeletal:  Negative for arthralgias and myalgias.  Neurological:  Negative for dizziness, weakness, light-headedness and headaches.  Psychiatric/Behavioral:  Positive for depression, dysphoric mood and sleep disturbance. Negative for suicidal ideas. The patient is nervous/anxious.      Lab Results  Component Value Date   NA 135 12/16/2023   K 3.7 12/16/2023   CO2 24 12/16/2023   GLUCOSE 104 (H) 12/16/2023   BUN 14 12/16/2023   CREATININE 0.91 12/16/2023   CALCIUM   9.1 12/16/2023   EGFR 85 01/17/2023   GFRNONAA >60 12/16/2023   Lab Results  Component Value Date   CHOL 215 (H) 12/16/2023   HDL 43 12/16/2023   LDLCALC 104 (H) 12/16/2023   LDLDIRECT 48 09/02/2019   TRIG 340 (H) 12/16/2023   CHOLHDL 5.0 12/16/2023   Lab Results  Component Value Date   TSH 2.380 01/17/2023   Lab Results  Component Value Date   HGBA1C 6.1 (A) 02/13/2024   Lab Results  Component Value Date   WBC 9.5 01/17/2023   HGB 12.7 01/17/2023   HCT 37.8 01/17/2023   MCV 86 01/17/2023   PLT 341 01/17/2023   Lab Results  Component Value Date   ALT 23 12/16/2023   AST 28 12/16/2023   ALKPHOS 34 (L) 12/16/2023   BILITOT 0.2 12/16/2023   No results found for: MARIEN BOLLS, VD25OH   Patient Active Problem List   Diagnosis Date Noted   Internal derangement of left knee 04/13/2024   BMI 34.0-34.9,adult 03/05/2023   Type II diabetes mellitus with complication (HCC) 03/05/2023   PMR (polymyalgia rheumatica) (HCC) 02/26/2023   Status post hysterectomy 03/05/2022   Essential hypertension 04/16/2021   Hepatic steatosis 03/19/2021   Carpal tunnel syndrome of right wrist 07/10/2020   Coronary artery disease of native artery of native heart with stable angina pectoris (HCC) 01/18/2019   Current moderate episode of major depressive disorder without prior episode (HCC) 01/18/2019   Neutrophilic leukocytosis 08/09/2018   Pulmonary nodule less than 6 cm determined by computed tomography of lung 07/30/2018   Night sweats 07/16/2018   Neck muscle spasm 07/15/2018   Hyperlipidemia associated with type 2 diabetes mellitus (HCC)  07/15/2018   Tobacco use disorder, moderate, in sustained remission 07/15/2018   Migraine without aura and without status migrainosus, not intractable 06/24/2018   Gastroesophageal reflux disease without esophagitis 06/24/2018   Chronic midline low back pain without sciatica 06/24/2018    Allergies  Allergen Reactions   Oxycodone Other  (See Comments)    Black outs and confusion   Dye Fdc Red [Red Dye #40 (Allura Red)] Other (See Comments)    Severe migraine    Past Surgical History:  Procedure Laterality Date   ABDOMINAL HYSTERECTOMY  2012   partial for bleeding   BACK SURGERY     BREAST BIOPSY Right    neg   BREAST BIOPSY Left 2015   4 bxs- neg   BREAST SURGERY Left    multiple biopsies   CARDIAC CATHETERIZATION     CATARACT EXTRACTION Right 12/19/2010   CATARACT EXTRACTION Left    CHOLECYSTECTOMY  1999   COLONOSCOPY WITH PROPOFOL  N/A 03/31/2020   Procedure: COLONOSCOPY WITH PROPOFOL ;  Surgeon: Jinny Carmine, MD;  Location: [facility];  Service: Endoscopy;  Laterality: N/A;   CORONARY PRESSURE/FFR STUDY N/A 07/22/2018   Procedure: INTRAVASCULAR PRESSURE WIRE/FFR STUDY;  Surgeon: Mady Bruckner, MD;  Location: [facility];  Service: Cardiovascular;  Laterality: N/A;   EYE SURGERY     INSERTION OF MESH  03/27/2023   Procedure: INSERTION OF MESH;  Surgeon: Jordis Laneta FALCON, MD;  Location: [facility];  Service: General;;   LEFT HEART CATH AND CORONARY ANGIOGRAPHY N/A 07/22/2018   Procedure: LEFT HEART CATH AND CORONARY ANGIOGRAPHY;  Surgeon: Mady Bruckner, MD;  Location: [facility];  Service: Cardiovascular;  Laterality: N/A;   POSTERIOR LUMBAR FUSION 4 LEVEL  2000   l 4-5 fusion   VENTRAL HERNIA REPAIR N/A 03/27/2023   Procedure: HERNIA REPAIR VENTRAL ADULT, open;  Surgeon: Jordis Laneta FALCON, MD;  Location: [facility];  Service: General;  Laterality: N/A;    Social History   Tobacco Use   Smoking status: Former    Current packs/day: 0.00    Average packs/day: 0.3 packs/day for 40.0 years (10.0 ttl pk-yrs)    Types: Cigarettes    Start date: 09/18/1979    Quit date: 09/18/2019    Years since quitting: 4.8    Passive exposure: Past   Smokeless tobacco: Never  Vaping Use   Vaping status: Never Used  Substance Use Topics   Alcohol use: Not Currently    Comment: less than once a week.    Drug use: Never     Medication list has been reviewed and updated.  Current Meds  Medication Sig   albuterol  (VENTOLIN  HFA) 108 (90 Base) MCG/ACT inhaler INHALE 2 PUFFS INTO THE LUNGS EVERY 4 HOURS AS NEEDED FOR WHEEZE OR FOR SHORTNESS OF BREATH   aspirin  EC 81 MG tablet Take 81 mg by mouth daily.   baclofen  (LIORESAL ) 10 MG tablet Take 1 tablet (10 mg total) by mouth 3 (three) times daily.   buPROPion  (WELLBUTRIN  XL) 300 MG 24 hr tablet TAKE 1 TABLET BY MOUTH EVERY DAY   diclofenac  (VOLTAREN ) 50 MG EC tablet TAKE 1 TABLET BY MOUTH 2 TIMES DAILY AS NEEDED.   escitalopram  (LEXAPRO ) 20 MG tablet Take 1 tablet (20 mg total) by mouth daily.   Esomeprazole Magnesium (NEXIUM PO) Take 20 mg by mouth daily.    fenofibrate  (TRICOR ) 145 MG tablet TAKE 1 TABLET BY MOUTH EVERY DAY   ibuprofen  (ADVIL ) 600 MG tablet Take 1 tablet (600  mg total) by mouth every 6 (six) hours as needed.   Lidocaine  4 % PTCH Apply 1 patch topically daily as needed (pain).    metFORMIN  (GLUCOPHAGE ) 500 MG tablet TAKE 1 TABLET BY MOUTH TWICE A DAY WITH FOOD   Multiple Vitamin (MULTIVITAMIN) capsule Take 1 capsule by mouth daily.   nitroGLYCERIN  (NITROSTAT ) 0.4 MG SL tablet Place 1 tablet (0.4 mg total) under the tongue every 5 (five) minutes as needed for chest pain.   rosuvastatin  (CRESTOR ) 40 MG tablet Take 1 tablet (40 mg total) by mouth daily.   SUMAtriptan  (IMITREX ) 100 MG tablet TAKE 1 TABLET (100 MG TOTAL) BY MOUTH AS NEEDED FOR MIGRAINE. MAY REPEAT IN 2 HOURS IF NEEDED   [DISCONTINUED] cariprazine  (VRAYLAR ) 3 MG capsule Take 1 capsule (3 mg total) by mouth daily.   [DISCONTINUED] clonazePAM  (KLONOPIN ) 0.25 MG disintegrating tablet Take 1 tablet (0.25 mg total) by mouth 2 (two) times daily as needed.   [DISCONTINUED] cyclobenzaprine  (FLEXERIL ) 10 MG tablet TAKE 1 TABLET BY MOUTH THREE TIMES A DAY AS NEEDED FOR MUSCLE SPASM   [DISCONTINUED] hydrochlorothiazide  (HYDRODIURIL ) 25 MG tablet Take 1 tablet (25 mg total) by  mouth daily.   [DISCONTINUED] tirzepatide  (MOUNJARO ) 15 MG/0.5ML Pen Inject 15 mg into the skin once a week.       07/09/2024    3:22 PM 04/27/2024    3:27 PM 02/13/2024    3:05 PM 12/16/2023   11:26 AM  GAD 7 : Generalized Anxiety Score  Nervous, Anxious, on Edge 3 3 3 3   Control/stop worrying 3 3 3 3   Worry too much - different things 3 3 3 3   Trouble relaxing 3 3 3 3   Restless 3 3 3 3   Easily annoyed or irritable 3 3 3 3   Afraid - awful might happen 3 3 3 3   Total GAD 7 Score 21 21 21 21   Anxiety Difficulty Somewhat difficult Extremely difficult Extremely difficult Extremely difficult       07/09/2024    3:22 PM 04/27/2024    3:26 PM 02/13/2024    3:04 PM  Depression screen PHQ 2/9  Decreased Interest 3 3 3   Down, Depressed, Hopeless 3 3 3   PHQ - 2 Score 6 6 6   Altered sleeping 3 3 3   Tired, decreased energy 3 3 3   Change in appetite 3 3 3   Feeling bad or failure about yourself  3 3 3   Trouble concentrating 1 1 1   Moving slowly or fidgety/restless 0 0 0  Suicidal thoughts 0 0 0  PHQ-9 Score 19 19 19   Difficult doing work/chores Somewhat difficult Not difficult at all Very difficult    BP Readings from Last 3 Encounters:  07/09/24 128/72  06/17/24 109/71  04/27/24 110/70    Physical Exam Vitals and nursing note reviewed.  Constitutional:      General: She is not in acute distress.    Appearance: She is well-developed.  HENT:     Head: Normocephalic and atraumatic.  Cardiovascular:     Rate and Rhythm: Normal rate and regular rhythm.     Heart sounds: No murmur heard. Pulmonary:     Effort: Pulmonary effort is normal. No respiratory distress.     Breath sounds: No wheezing or rhonchi.  Musculoskeletal:     Right lower leg: No edema.     Left lower leg: No edema.  Skin:    General: Skin is warm and dry.     Capillary Refill: Capillary refill takes less than  2 seconds.     Findings: No rash.  Neurological:     General: No focal deficit present.      Mental Status: She is alert and oriented to person, place, and time.  Psychiatric:        Mood and Affect: Mood normal.        Behavior: Behavior normal.    Diabetic Foot Exam - Simple   Simple Foot Form Diabetic Foot exam was performed with the following findings: Yes 07/09/2024  3:56 PM  Visual Inspection No deformities, no ulcerations, no other skin breakdown bilaterally: Yes Sensation Testing Intact to touch and monofilament testing bilaterally: Yes Pulse Check Posterior Tibialis and Dorsalis pulse intact bilaterally: Yes Comments      Wt Readings from Last 3 Encounters:  07/09/24 194 lb (88 kg)  04/27/24 184 lb 6 oz (83.6 kg)  04/13/24 188 lb (85.3 kg)    BP 128/72   Pulse 72   Ht 5' 3 (1.6 m)   Wt 194 lb (88 kg)   SpO2 98%   BMI 34.37 kg/m   Assessment and Plan:  Problem List Items Addressed This Visit       Unprioritized   BMI 34.0-34.9,adult   Chronic midline low back pain without sciatica   Continue PRN flexeril  and ibuprofen .      Relevant Medications   clonazePAM  (KLONOPIN ) 0.25 MG disintegrating tablet   cyclobenzaprine  (FLEXERIL ) 10 MG tablet   Coronary artery disease of native artery of native heart with stable angina pectoris (HCC) (Chronic)   On Crestor  Lab Results  Component Value Date   LDLCALC 104 (H) 12/16/2023         Relevant Medications   clonazePAM  (KLONOPIN ) 0.25 MG disintegrating tablet   cyclobenzaprine  (FLEXERIL ) 10 MG tablet   hydrochlorothiazide  (HYDRODIURIL ) 25 MG tablet   Current moderate episode of major depressive disorder without prior episode (HCC)   Clinically stable on Lexapro , Bupropion  and Vraylar  3 mg..   No SI or HI on evaluation. High PHQ and GAD7 scores. Reduce Vraylar  to 1.5 mg since no benefit to higher dose. Hopefully getting her grandson into the [employer] in [place] will give her a break.       Relevant Medications   clonazePAM  (KLONOPIN ) 0.25 MG disintegrating tablet   cariprazine  (VRAYLAR ) 1.5  MG capsule   Essential hypertension (Chronic)   BP controlled with hctz,  did not tolerate beta blocker. No longer using Furosemdie      Relevant Medications   hydrochlorothiazide  (HYDRODIURIL ) 25 MG tablet   Hyperlipidemia associated with type 2 diabetes mellitus (HCC) (Chronic)   LDL is  Lab Results  Component Value Date   LDLCALC 104 (H) 12/16/2023   Currently taking Crestor   No medication side effects or other concerns. Recommended LDL goal is < 55.       Relevant Medications   hydrochlorothiazide  (HYDRODIURIL ) 25 MG tablet   tirzepatide  (MOUNJARO ) 15 MG/0.5ML Pen   Type II diabetes mellitus with complication (HCC) - Primary (Chronic)   Blood sugars have been stable.  No hypoglycemic events since last visit. Currently medications are MTF and Mounjaro . Last visit medical regimen changes were none. Lab Results  Component Value Date   HGBA1C 6.1 (A) 02/13/2024  A1C today = 5.8.  however, she is not losing weight like she did initially.  She is on max dose Mounjaro  - had lost excessive weight on Ozempic .       Relevant Medications   tirzepatide  (MOUNJARO ) 15 MG/0.5ML Pen  Other Relevant Orders   POCT glycosylated hemoglobin (Hb A1C)   Microalbumin / creatinine urine ratio   Other Visit Diagnoses       Encounter for screening mammogram for breast cancer       Mammogram ordered - reminded to schedule.     Long-term current use of injectable noninsulin antidiabetic medication           Return in about 4 months (around 11/09/2024) for CPX.    Leita HILARIO Adie, MD [facility] [place]

## 2024-07-09 NOTE — Patient Instructions (Addendum)
 Call [facility] to schedule your mammogram at [phone].   Schedule your Eye exam.

## 2024-07-09 NOTE — Assessment & Plan Note (Addendum)
 BP controlled with hctz,  did not tolerate beta blocker. No longer using Furosemdie

## 2024-07-10 LAB — MICROALBUMIN / CREATININE URINE RATIO
Creatinine, Urine: 24.6 mg/dL
Microalb/Creat Ratio: <12 mg/g{creat} (ref 0–29)
Microalbumin, Urine: <3 ug/mL

## 2024-07-10 LAB — SPECIMEN STATUS REPORT

## 2024-07-28 ENCOUNTER — Telehealth: Payer: Self-pay

## 2024-07-28 ENCOUNTER — Other Ambulatory Visit: Payer: Self-pay

## 2024-07-28 DIAGNOSIS — E118 Type 2 diabetes mellitus with unspecified complications: Secondary | ICD-10-CM

## 2024-07-28 MED ORDER — METFORMIN HCL 500 MG PO TABS: 500.0000 mg | ORAL_TABLET | Freq: Two times a day (BID) | ORAL | 1 refills | Status: AC

## 2024-07-28 MED ORDER — TIRZEPATIDE 15 MG/0.5ML ~~LOC~~ SOAJ
15.0000 mg | SUBCUTANEOUS | 5 refills | Status: DC
Start: 2024-07-28 — End: 2024-09-07

## 2024-07-28 NOTE — Telephone Encounter (Signed)
 Pt requesting refill for clonazepam .  Thanks!

## 2024-07-29 NOTE — Telephone Encounter (Signed)
Pt informed

## 2024-08-04 ENCOUNTER — Other Ambulatory Visit: Payer: Self-pay | Admitting: Family Medicine

## 2024-08-04 DIAGNOSIS — M2392 Unspecified internal derangement of left knee: Secondary | ICD-10-CM

## 2024-08-05 NOTE — Telephone Encounter (Signed)
 Requested medication (s) are due for refill today: yes  Requested medication (s) are on the active medication list: yes  Last refill:  06/01/24  Future visit scheduled: yes  Notes to clinic:  Unable to refill per protocol due to failed labs, no updated results.      Requested Prescriptions  Pending Prescriptions Disp Refills   diclofenac  (VOLTAREN ) 50 MG EC tablet [Pharmacy Med Name: DICLOFENAC  SOD EC 50 MG TAB] 30 tablet 0    Sig: TAKE 1 TABLET BY MOUTH TWICE A DAY AS NEEDED     Analgesics:  NSAIDS Failed - 08/05/2024  3:52 PM      Failed - Manual Review: Labs are only required if the patient has taken medication for more than 8 weeks.      Failed - HGB in normal range and within 360 days    Hemoglobin  Date Value Ref Range Status  01/17/2023 12.7 11.1 - 15.9 g/dL Final         Failed - PLT in normal range and within 360 days    Platelets  Date Value Ref Range Status  01/17/2023 341 150 - 450 x10E3/uL Final         Failed - HCT in normal range and within 360 days    Hematocrit  Date Value Ref Range Status  01/17/2023 37.8 34.0 - 46.6 % Final         Passed - Cr in normal range and within 360 days    Creatinine, Ser  Date Value Ref Range Status  12/16/2023 0.91 0.44 - 1.00 mg/dL Final         Passed - eGFR is 30 or above and within 360 days    GFR calc Af Amer  Date Value Ref Range Status  09/11/2020 100 >59 mL/min/1.73 Final    Comment:    **[facility] currently reports eGFR in compliance with the current**   recommendations of the [employer]. [facility] will   update reporting as new guidelines are published from the NKF-ASN   Task force.    GFR, Estimated  Date Value Ref Range Status  12/16/2023 >60 >60 mL/min Final    Comment:    (NOTE) Calculated using the CKD-EPI Creatinine Equation (2021)    eGFR  Date Value Ref Range Status  01/17/2023 85 >59 mL/min/1.73 Final         Passed - Patient is not pregnant      Passed - Valid encounter  within last 12 months    Recent Outpatient Visits           3 weeks ago Type II diabetes mellitus with complication (HCC)   [facility], Leita DEL, MD   3 months ago Internal derangement of left knee   [facility], Selinda PARAS, MD   3 months ago Internal derangement of left knee   [facility], Selinda PARAS, MD   5 months ago Essential hypertension   [facility] at [facility], Leita DEL, MD

## 2024-08-11 ENCOUNTER — Other Ambulatory Visit: Payer: Self-pay | Admitting: Internal Medicine

## 2024-08-11 ENCOUNTER — Telehealth: Payer: Self-pay

## 2024-08-11 DIAGNOSIS — F321 Major depressive disorder, single episode, moderate: Secondary | ICD-10-CM

## 2024-08-11 MED ORDER — CLONAZEPAM 0.25 MG PO TBDP: 0.2500 mg | ORAL_TABLET | Freq: Two times a day (BID) | ORAL | 2 refills | Status: AC | PRN

## 2024-08-11 NOTE — Progress Notes (Unsigned)
 Date:  08/11/2024   Name:  Nancy Skinner   DOB:  [birth year]   MRN:  [id number]   Chief Complaint: No chief complaint on file.  HPI  Review of Systems   Lab Results  Component Value Date   NA 135 12/16/2023   K 3.7 12/16/2023   CO2 24 12/16/2023   GLUCOSE 104 (H) 12/16/2023   BUN 14 12/16/2023   CREATININE 0.91 12/16/2023   CALCIUM  9.1 12/16/2023   EGFR 85 01/17/2023   GFRNONAA >60 12/16/2023   Lab Results  Component Value Date   CHOL 215 (H) 12/16/2023   HDL 43 12/16/2023   LDLCALC 104 (H) 12/16/2023   LDLDIRECT 48 09/02/2019   TRIG 340 (H) 12/16/2023   CHOLHDL 5.0 12/16/2023   Lab Results  Component Value Date   TSH 2.380 01/17/2023   Lab Results  Component Value Date   HGBA1C 5.8 (A) 07/09/2024   Lab Results  Component Value Date   WBC 9.5 01/17/2023   HGB 12.7 01/17/2023   HCT 37.8 01/17/2023   MCV 86 01/17/2023   PLT 341 01/17/2023   Lab Results  Component Value Date   ALT 23 12/16/2023   AST 28 12/16/2023   ALKPHOS 34 (L) 12/16/2023   BILITOT 0.2 12/16/2023   No results found for: MARIEN BOLLS, VD25OH   Patient Active Problem List   Diagnosis Date Noted   Internal derangement of left knee 04/13/2024   BMI 34.0-34.9,adult 03/05/2023   Type II diabetes mellitus with complication (HCC) 03/05/2023   PMR (polymyalgia rheumatica) (HCC) 02/26/2023   Status post hysterectomy 03/05/2022   Essential hypertension 04/16/2021   Hepatic steatosis 03/19/2021   Carpal tunnel syndrome of right wrist 07/10/2020   Coronary artery disease of native artery of native heart with stable angina pectoris (HCC) 01/18/2019   Current moderate episode of major depressive disorder without prior episode (HCC) 01/18/2019   Neutrophilic leukocytosis 08/09/2018   Pulmonary nodule less than 6 cm determined by computed tomography of lung 07/30/2018   Night sweats 07/16/2018   Neck muscle spasm 07/15/2018   Hyperlipidemia associated with type 2 diabetes  mellitus (HCC) 07/15/2018   Tobacco use disorder, moderate, in sustained remission 07/15/2018   Migraine without aura and without status migrainosus, not intractable 06/24/2018   Gastroesophageal reflux disease without esophagitis 06/24/2018   Chronic midline low back pain without sciatica 06/24/2018    Allergies  Allergen Reactions   Oxycodone Other (See Comments)    Black outs and confusion   Dye Fdc Red [Red Dye #40 (Allura Red)] Other (See Comments)    Severe migraine    Past Surgical History:  Procedure Laterality Date   ABDOMINAL HYSTERECTOMY  2012   partial for bleeding   BACK SURGERY     BREAST BIOPSY Right    neg   BREAST BIOPSY Left 2015   4 bxs- neg   BREAST SURGERY Left    multiple biopsies   CARDIAC CATHETERIZATION     CATARACT EXTRACTION Right 12/19/2010   CATARACT EXTRACTION Left    CHOLECYSTECTOMY  1999   COLONOSCOPY WITH PROPOFOL  N/A 03/31/2020   Procedure: COLONOSCOPY WITH PROPOFOL ;  Surgeon: Jinny Carmine, MD;  Location: [facility];  Service: Endoscopy;  Laterality: N/A;   CORONARY PRESSURE/FFR STUDY N/A 07/22/2018   Procedure: INTRAVASCULAR PRESSURE WIRE/FFR STUDY;  Surgeon: Mady Bruckner, MD;  Location: [facility];  Service: Cardiovascular;  Laterality: N/A;   EYE SURGERY     INSERTION OF MESH  03/27/2023  Procedure: INSERTION OF MESH;  Surgeon: Jordis Laneta FALCON, MD;  Location: [facility];  Service: General;;   LEFT HEART CATH AND CORONARY ANGIOGRAPHY N/A 07/22/2018   Procedure: LEFT HEART CATH AND CORONARY ANGIOGRAPHY;  Surgeon: Mady Bruckner, MD;  Location: [facility];  Service: Cardiovascular;  Laterality: N/A;   POSTERIOR LUMBAR FUSION 4 LEVEL  2000   l 4-5 fusion   VENTRAL HERNIA REPAIR N/A 03/27/2023   Procedure: HERNIA REPAIR VENTRAL ADULT, open;  Surgeon: Jordis Laneta FALCON, MD;  Location: [facility];  Service: General;  Laterality: N/A;    Social History   Tobacco Use   Smoking status: Former    Current packs/day: 0.00     Average packs/day: 0.3 packs/day for 40.0 years (10.0 ttl pk-yrs)    Types: Cigarettes    Start date: 09/18/1979    Quit date: 09/18/2019    Years since quitting: 4.9    Passive exposure: Past   Smokeless tobacco: Never  Vaping Use   Vaping status: Never Used  Substance Use Topics   Alcohol use: Not Currently    Comment: less than once a week.   Drug use: Never     Medication list has been reviewed and updated.  No outpatient medications have been marked as taking for the 08/11/24 encounter (Orders Only) with Justus Leita DEL, MD.       07/09/2024    3:22 PM 04/27/2024    3:27 PM 02/13/2024    3:05 PM 12/16/2023   11:26 AM  GAD 7 : Generalized Anxiety Score  Nervous, Anxious, on Edge 3 3 3 3   Control/stop worrying 3 3 3 3   Worry too much - different things 3 3 3 3   Trouble relaxing 3 3 3 3   Restless 3 3 3 3   Easily annoyed or irritable 3 3 3 3   Afraid - awful might happen 3 3 3 3   Total GAD 7 Score 21 21 21 21   Anxiety Difficulty Somewhat difficult Extremely difficult Extremely difficult Extremely difficult       07/09/2024    3:22 PM 04/27/2024    3:26 PM 02/13/2024    3:04 PM  Depression screen PHQ 2/9  Decreased Interest 3 3 3   Down, Depressed, Hopeless 3 3 3   PHQ - 2 Score 6 6 6   Altered sleeping 3 3 3   Tired, decreased energy 3 3 3   Change in appetite 3 3 3   Feeling bad or failure about yourself  3 3 3   Trouble concentrating 1 1 1   Moving slowly or fidgety/restless 0 0 0  Suicidal thoughts 0 0 0  PHQ-9 Score 19 19 19   Difficult doing work/chores Somewhat difficult Not difficult at all Very difficult    BP Readings from Last 3 Encounters:  07/09/24 128/72  06/17/24 109/71  04/27/24 110/70    Physical Exam  Wt Readings from Last 3 Encounters:  07/09/24 194 lb (88 kg)  04/27/24 184 lb 6 oz (83.6 kg)  04/13/24 188 lb (85.3 kg)    There were no vitals taken for this visit.  Assessment and Plan:  Problem List Items Addressed This Visit    None   No follow-ups on file.    Leita HILARIO Justus, MD [facility] [place]

## 2024-08-11 NOTE — Telephone Encounter (Signed)
 Patient requesting refill on clonazepam .  Please review.  CM

## 2024-08-15 ENCOUNTER — Other Ambulatory Visit: Payer: Self-pay | Admitting: Internal Medicine

## 2024-08-15 DIAGNOSIS — R609 Edema, unspecified: Secondary | ICD-10-CM

## 2024-08-15 DIAGNOSIS — F324 Major depressive disorder, single episode, in partial remission: Secondary | ICD-10-CM

## 2024-08-17 NOTE — Telephone Encounter (Signed)
 Lasix  discontinued on 07/09/24, change in therapy.  Requested Prescriptions  Pending Prescriptions Disp Refills   furosemide  (LASIX ) 20 MG tablet [Pharmacy Med Name: FUROSEMIDE  20 MG TABLET] 90 tablet 0    Sig: TAKE 1 TABLET BY MOUTH EVERY DAY     Cardiovascular:  Diuretics - Loop Failed - 08/17/2024 12:37 PM      Failed - K in normal range and within 180 days    Potassium  Date Value Ref Range Status  12/16/2023 3.7 3.5 - 5.1 mmol/L Final         Failed - Ca in normal range and within 180 days    Calcium   Date Value Ref Range Status  12/16/2023 9.1 8.9 - 10.3 mg/dL Final         Failed - Na in normal range and within 180 days    Sodium  Date Value Ref Range Status  12/16/2023 135 135 - 145 mmol/L Final  01/17/2023 138 134 - 144 mmol/L Final         Failed - Cr in normal range and within 180 days    Creatinine, Ser  Date Value Ref Range Status  12/16/2023 0.91 0.44 - 1.00 mg/dL Final         Failed - Cl in normal range and within 180 days    Chloride  Date Value Ref Range Status  12/16/2023 101 98 - 111 mmol/L Final         Failed - Mg Level in normal range and within 180 days    No results found for: MG       Passed - Last BP in normal range    BP Readings from Last 1 Encounters:  07/09/24 128/72         Passed - Valid encounter within last 6 months    Recent Outpatient Visits           1 month ago Type II diabetes mellitus with complication [facility])   [place] [facility] at [facility], Leita DEL, MD   3 months ago Internal derangement of left knee   [place] [facility] at [facility], Selinda PARAS, MD   4 months ago Internal derangement of left knee   [place] [facility] at [facility], Selinda PARAS, MD   6 months ago Essential hypertension   [place] [facility] at [facility], Leita DEL, MD               buPROPion   (WELLBUTRIN  XL) 300 MG 24 hr tablet [Pharmacy Med Name: BUPROPION  HCL XL 300 MG TABLET] 90 tablet 0    Sig: TAKE 1 TABLET BY MOUTH EVERY DAY     Psychiatry: Antidepressants - bupropion  Passed - 08/17/2024 12:37 PM      Passed - Cr in normal range and within 360 days    Creatinine, Ser  Date Value Ref Range Status  12/16/2023 0.91 0.44 - 1.00 mg/dL Final         Passed - AST in normal range and within 360 days    AST  Date Value Ref Range Status  12/16/2023 28 15 - 41 U/L Final         Passed - ALT in normal range and within 360 days    ALT  Date Value Ref Range Status  12/16/2023 23 0 - 44 U/L Final  Passed - Completed PHQ-2 or PHQ-9 in the last 360 days      Passed - Last BP in normal range    BP Readings from Last 1 Encounters:  07/09/24 128/72         Passed - Valid encounter within last 6 months    Recent Outpatient Visits           1 month ago Type II diabetes mellitus with complication [facility])   [place] [facility] at [facility], Leita DEL, MD   3 months ago Internal derangement of left knee   [facility] at [facility], Selinda PARAS, MD   4 months ago Internal derangement of left knee   [facility] at [facility], Selinda PARAS, MD   6 months ago Essential hypertension   [facility] Medicine at [facility], Leita DEL, MD

## 2024-08-18 ENCOUNTER — Other Ambulatory Visit: Payer: Self-pay | Admitting: Family Medicine

## 2024-08-18 DIAGNOSIS — M2392 Unspecified internal derangement of left knee: Secondary | ICD-10-CM

## 2024-08-19 NOTE — Telephone Encounter (Signed)
 Requested medication (s) are due for refill today - unsure  Requested medication (s) are on the active medication list -yes  Future visit scheduled -yes  Last refill: 08/06/24 #30  Notes to clinic: fails lab protocol- over 1 year-01/17/23  Requested Prescriptions  Pending Prescriptions Disp Refills   diclofenac  (VOLTAREN ) 50 MG EC tablet [Pharmacy Med Name: DICLOFENAC  SOD EC 50 MG TAB] 30 tablet 0    Sig: TAKE 1 TABLET BY MOUTH TWICE A DAY AS NEEDED     Analgesics:  NSAIDS Failed - 08/19/2024 11:39 AM      Failed - Manual Review: Labs are only required if the patient has taken medication for more than 8 weeks.      Failed - HGB in normal range and within 360 days    Hemoglobin  Date Value Ref Range Status  01/17/2023 12.7 11.1 - 15.9 g/dL Final         Failed - PLT in normal range and within 360 days    Platelets  Date Value Ref Range Status  01/17/2023 341 150 - 450 x10E3/uL Final         Failed - HCT in normal range and within 360 days    Hematocrit  Date Value Ref Range Status  01/17/2023 37.8 34.0 - 46.6 % Final         Passed - Cr in normal range and within 360 days    Creatinine, Ser  Date Value Ref Range Status  12/16/2023 0.91 0.44 - 1.00 mg/dL Final         Passed - eGFR is 30 or above and within 360 days    GFR calc Af Amer  Date Value Ref Range Status  09/11/2020 100 >59 mL/min/1.73 Final    Comment:    **[facility] currently reports eGFR in compliance with the current**   recommendations of the [employer]. [facility] will   update reporting as new guidelines are published from the NKF-ASN   Task force.    GFR, Estimated  Date Value Ref Range Status  12/16/2023 >60 >60 mL/min Final    Comment:    (NOTE) Calculated using the CKD-EPI Creatinine Equation (2021)    eGFR  Date Value Ref Range Status  01/17/2023 85 >59 mL/min/1.73 Final         Passed - Patient is not pregnant      Passed - Valid encounter within last 12 months    Recent  Outpatient Visits           1 month ago Type II diabetes mellitus with complication (HCC)   [place] [facility] at [facility], Leita DEL, MD   3 months ago Internal derangement of left knee   [place] [facility] at [facility], Selinda PARAS, MD   4 months ago Internal derangement of left knee   [facility] at [facility], Selinda PARAS, MD   6 months ago Essential hypertension   [place] [facility] at [facility], Leita DEL, MD                 Requested Prescriptions  Pending Prescriptions Disp Refills   diclofenac  (VOLTAREN ) 50 MG EC tablet [Pharmacy Med Name: DICLOFENAC  SOD EC 50 MG TAB] 30 tablet 0    Sig: TAKE 1 TABLET BY MOUTH TWICE A DAY AS NEEDED     Analgesics:  NSAIDS Failed -  08/19/2024 11:39 AM      Failed - Manual Review: Labs are only required if the patient has taken medication for more than 8 weeks.      Failed - HGB in normal range and within 360 days    Hemoglobin  Date Value Ref Range Status  01/17/2023 12.7 11.1 - 15.9 g/dL Final         Failed - PLT in normal range and within 360 days    Platelets  Date Value Ref Range Status  01/17/2023 341 150 - 450 x10E3/uL Final         Failed - HCT in normal range and within 360 days    Hematocrit  Date Value Ref Range Status  01/17/2023 37.8 34.0 - 46.6 % Final         Passed - Cr in normal range and within 360 days    Creatinine, Ser  Date Value Ref Range Status  12/16/2023 0.91 0.44 - 1.00 mg/dL Final         Passed - eGFR is 30 or above and within 360 days    GFR calc Af Amer  Date Value Ref Range Status  09/11/2020 100 >59 mL/min/1.73 Final    Comment:    **[facility] currently reports eGFR in compliance with the current**   recommendations of the [employer]. [facility] will   update reporting as new guidelines are published from the NKF-ASN    Task force.    GFR, Estimated  Date Value Ref Range Status  12/16/2023 >60 >60 mL/min Final    Comment:    (NOTE) Calculated using the CKD-EPI Creatinine Equation (2021)    eGFR  Date Value Ref Range Status  01/17/2023 85 >59 mL/min/1.73 Final         Passed - Patient is not pregnant      Passed - Valid encounter within last 12 months    Recent Outpatient Visits           1 month ago Type II diabetes mellitus with complication (HCC)   [place] [facility] at [facility], Leita DEL, MD   3 months ago Internal derangement of left knee   [facility] at [facility], Selinda PARAS, MD   4 months ago Internal derangement of left knee   [facility] at [facility], Selinda PARAS, MD   6 months ago Essential hypertension   [facility] Medicine at [facility], Leita DEL, MD

## 2024-09-03 ENCOUNTER — Other Ambulatory Visit: Payer: Self-pay | Admitting: Internal Medicine

## 2024-09-03 DIAGNOSIS — G8929 Other chronic pain: Secondary | ICD-10-CM

## 2024-09-03 NOTE — Telephone Encounter (Signed)
 Requested medications are due for refill today.  yes  Requested medications are on the active medications list.  yes  Last refill. 07/09/2024 #90 0   Future visit scheduled.   yes  Notes to clinic.  Refill not delegated.    Requested Prescriptions  Pending Prescriptions Disp Refills   cyclobenzaprine  (FLEXERIL ) 10 MG tablet [Pharmacy Med Name: CYCLOBENZAPRINE  10 MG TABLET] 90 tablet 0    Sig: TAKE 1 TABLET BY MOUTH THREE TIMES A DAY AS NEEDED FOR MUSCLE SPASM     Not Delegated - Analgesics:  Muscle Relaxants Failed - 09/03/2024  2:13 PM      Failed - This refill cannot be delegated      Passed - Valid encounter within last 6 months    Recent Outpatient Visits           1 month ago Type II diabetes mellitus with complication [facility])   [place] [facility] at [facility], Leita DEL, MD   4 months ago Internal derangement of left knee   [facility] at [facility], Selinda PARAS, MD   4 months ago Internal derangement of left knee   [facility] at [facility], Selinda PARAS, MD   6 months ago Essential hypertension   [facility] Medicine at [facility], Leita DEL, MD

## 2024-09-07 ENCOUNTER — Other Ambulatory Visit: Payer: Self-pay

## 2024-09-07 ENCOUNTER — Encounter: Payer: Self-pay | Admitting: Internal Medicine

## 2024-09-07 ENCOUNTER — Ambulatory Visit (INDEPENDENT_AMBULATORY_CARE_PROVIDER_SITE_OTHER): Admitting: Internal Medicine

## 2024-09-07 VITALS — BP 122/68 | HR 77 | Ht 63.0 in | Wt 164.0 lb

## 2024-09-07 DIAGNOSIS — M62838 Other muscle spasm: Secondary | ICD-10-CM | POA: Diagnosis not present

## 2024-09-07 DIAGNOSIS — E118 Type 2 diabetes mellitus with unspecified complications: Secondary | ICD-10-CM

## 2024-09-07 MED ORDER — DICLOFENAC SODIUM 50 MG PO TBEC: 50.0000 mg | DELAYED_RELEASE_TABLET | Freq: Two times a day (BID) | ORAL | 0 refills | Status: DC

## 2024-09-07 MED ORDER — METAXALONE 800 MG PO TABS: 800.0000 mg | ORAL_TABLET | Freq: Three times a day (TID) | ORAL | 0 refills | Status: DC | PRN

## 2024-09-07 MED ORDER — TIRZEPATIDE 15 MG/0.5ML ~~LOC~~ SOAJ: 15.0000 mg | SUBCUTANEOUS | 1 refills | Status: AC

## 2024-09-07 NOTE — Assessment & Plan Note (Addendum)
 stop flexeril  and use Skelaxin  add Voltaren  bid continue to use heat and pain patches. If no improvement, will get imaging and refer to PTx

## 2024-09-07 NOTE — Progress Notes (Addendum)
 Date:  09/07/2024   Name:  Minahil Quinlivan   DOB:  05-30-1966   MRN:  [id number]   Chief Complaint: Neck Pain (Left sided neck pain, and stiffness. Cannot turn neck. Painful, and tight muscles in neck.)  Neck Pain  This is a new problem. The current episode started in the past 7 days. The problem occurs constantly. The problem has been unchanged. The pain is associated with nothing. The quality of the pain is described as aching and cramping. The pain is moderate. The symptoms are aggravated by twisting. Pertinent negatives include no fever, headaches, numbness or weakness. She has tried muscle relaxants for the symptoms. The treatment provided no relief.    Review of Systems  Constitutional:  Negative for chills, fatigue and fever.  Respiratory:  Negative for chest tightness and shortness of breath.   Musculoskeletal:  Positive for neck pain.  Neurological:  Negative for dizziness, weakness, numbness and headaches.     Lab Results  Component Value Date   NA 135 12/16/2023   K 3.7 12/16/2023   CO2 24 12/16/2023   GLUCOSE 104 (H) 12/16/2023   BUN 14 12/16/2023   CREATININE 0.91 12/16/2023   CALCIUM  9.1 12/16/2023   EGFR 85 01/17/2023   GFRNONAA >60 12/16/2023   Lab Results  Component Value Date   CHOL 215 (H) 12/16/2023   HDL 43 12/16/2023   LDLCALC 104 (H) 12/16/2023   LDLDIRECT 48 09/02/2019   TRIG 340 (H) 12/16/2023   CHOLHDL 5.0 12/16/2023   Lab Results  Component Value Date   TSH 2.380 01/17/2023   Lab Results  Component Value Date   HGBA1C 5.8 (A) 07/09/2024   Lab Results  Component Value Date   WBC 9.5 01/17/2023   HGB 12.7 01/17/2023   HCT 37.8 01/17/2023   MCV 86 01/17/2023   PLT 341 01/17/2023   Lab Results  Component Value Date   ALT 23 12/16/2023   AST 28 12/16/2023   ALKPHOS 34 (L) 12/16/2023   BILITOT 0.2 12/16/2023   No results found for: MARIEN BOLLS, VD25OH   Patient Active Problem List   Diagnosis Date Noted    Internal derangement of left knee 04/13/2024   BMI 34.0-34.9,adult 03/05/2023   Type II diabetes mellitus with complication (HCC) 03/05/2023   PMR (polymyalgia rheumatica) 02/26/2023   Status post hysterectomy 03/05/2022   Essential hypertension 04/16/2021   Hepatic steatosis 03/19/2021   Carpal tunnel syndrome of right wrist 07/10/2020   Coronary artery disease of native artery of native heart with stable angina pectoris 01/18/2019   Current moderate episode of major depressive disorder without prior episode (HCC) 01/18/2019   Neutrophilic leukocytosis 08/09/2018   Pulmonary nodule less than 6 cm determined by computed tomography of lung 07/30/2018   Night sweats 07/16/2018   Neck muscle spasm 07/15/2018   Hyperlipidemia associated with type 2 diabetes mellitus (HCC) 07/15/2018   Tobacco use disorder, moderate, in sustained remission 07/15/2018   Migraine without aura and without status migrainosus, not intractable 06/24/2018   Gastroesophageal reflux disease without esophagitis 06/24/2018   Chronic midline low back pain without sciatica 06/24/2018    Allergies  Allergen Reactions   Oxycodone Other (See Comments)    Black outs and confusion   Dye Fdc Red [Red Dye #40 (Allura Red)] Other (See Comments)    Severe migraine    Past Surgical History:  Procedure Laterality Date   ABDOMINAL HYSTERECTOMY  2012   partial for bleeding   BACK SURGERY  BREAST BIOPSY Right    neg   BREAST BIOPSY Left 2015   4 bxs- neg   BREAST SURGERY Left    multiple biopsies   CARDIAC CATHETERIZATION     CATARACT EXTRACTION Right 12/19/2010   CATARACT EXTRACTION Left    CHOLECYSTECTOMY  1999   COLONOSCOPY WITH PROPOFOL  N/A 03/31/2020   Procedure: COLONOSCOPY WITH PROPOFOL ;  Surgeon: Jinny Carmine, MD;  Location: [facility] ENDOSCOPY;  Service: Endoscopy;  Laterality: N/A;   CORONARY PRESSURE/FFR STUDY N/A 07/22/2018   Procedure: INTRAVASCULAR PRESSURE WIRE/FFR STUDY;  Surgeon: Mady Bruckner, MD;   Location: [facility] INVASIVE CV LAB;  Service: Cardiovascular;  Laterality: N/A;   EYE SURGERY     INSERTION OF MESH  03/27/2023   Procedure: INSERTION OF MESH;  Surgeon: Jordis Laneta FALCON, MD;  Location: [facility] ORS;  Service: General;;   LEFT HEART CATH AND CORONARY ANGIOGRAPHY N/A 07/22/2018   Procedure: LEFT HEART CATH AND CORONARY ANGIOGRAPHY;  Surgeon: Mady Bruckner, MD;  Location: [facility] INVASIVE CV LAB;  Service: Cardiovascular;  Laterality: N/A;   POSTERIOR LUMBAR FUSION 4 LEVEL  2000   l 4-5 fusion   VENTRAL HERNIA REPAIR N/A 03/27/2023   Procedure: HERNIA REPAIR VENTRAL ADULT, open;  Surgeon: Jordis Laneta FALCON, MD;  Location: [facility] ORS;  Service: General;  Laterality: N/A;    Social History   Tobacco Use   Smoking status: Former    Current packs/day: 0.00    Average packs/day: 0.3 packs/day for 40.0 years (10.0 ttl pk-yrs)    Types: Cigarettes    Start date: 09/18/1979    Quit date: 09/18/2019    Years since quitting: 4.9    Passive exposure: Past   Smokeless tobacco: Never  Vaping Use   Vaping status: Never Used  Substance Use Topics   Alcohol use: Not Currently    Comment: less than once a week.   Drug use: Never     Medication list has been reviewed and updated.  Current Meds  Medication Sig   albuterol  (VENTOLIN  HFA) 108 (90 Base) MCG/ACT inhaler INHALE 2 PUFFS INTO THE LUNGS EVERY 4 HOURS AS NEEDED FOR WHEEZE OR FOR SHORTNESS OF BREATH   aspirin  EC 81 MG tablet Take 81 mg by mouth daily.   baclofen  (LIORESAL ) 10 MG tablet Take 1 tablet (10 mg total) by mouth 3 (three) times daily.   buPROPion  (WELLBUTRIN  XL) 300 MG 24 hr tablet TAKE 1 TABLET BY MOUTH EVERY DAY   cariprazine  (VRAYLAR ) 1.5 MG capsule Take 1 capsule (1.5 mg total) by mouth daily.   clonazePAM  (KLONOPIN ) 0.25 MG disintegrating tablet Take 1 tablet (0.25 mg total) by mouth 2 (two) times daily as needed.   escitalopram  (LEXAPRO ) 20 MG tablet Take 1 tablet (20 mg total) by mouth daily.   Esomeprazole Magnesium  (NEXIUM PO) Take 20 mg by mouth daily.    fenofibrate  (TRICOR ) 145 MG tablet TAKE 1 TABLET BY MOUTH EVERY DAY   hydrochlorothiazide  (HYDRODIURIL ) 25 MG tablet Take 1 tablet (25 mg total) by mouth daily.   ibuprofen  (ADVIL ) 600 MG tablet Take 1 tablet (600 mg total) by mouth every 6 (six) hours as needed.   Lidocaine  4 % PTCH Apply 1 patch topically daily as needed (pain).    metaxalone  (SKELAXIN ) 800 MG tablet Take 1 tablet (800 mg total) by mouth 3 (three) times daily as needed for muscle spasms.   metFORMIN  (GLUCOPHAGE ) 500 MG tablet Take 1 tablet (500 mg total) by mouth 2 (two) times daily with a meal.  Multiple Vitamin (MULTIVITAMIN) capsule Take 1 capsule by mouth daily.   nitroGLYCERIN  (NITROSTAT ) 0.4 MG SL tablet Place 1 tablet (0.4 mg total) under the tongue every 5 (five) minutes as needed for chest pain.   rosuvastatin  (CRESTOR ) 40 MG tablet Take 1 tablet (40 mg total) by mouth daily.   SUMAtriptan  (IMITREX ) 100 MG tablet TAKE 1 TABLET (100 MG TOTAL) BY MOUTH AS NEEDED FOR MIGRAINE. MAY REPEAT IN 2 HOURS IF NEEDED   tirzepatide  (MOUNJARO ) 15 MG/0.5ML Pen Inject 15 mg into the skin once a week.   [DISCONTINUED] cyclobenzaprine  (FLEXERIL ) 10 MG tablet TAKE 1 TABLET BY MOUTH THREE TIMES A DAY AS NEEDED FOR MUSCLE SPASM   [DISCONTINUED] diclofenac  (VOLTAREN ) 50 MG EC tablet TAKE 1 TABLET BY MOUTH TWICE A DAY AS NEEDED       09/07/2024    2:23 PM 07/09/2024    3:22 PM 04/27/2024    3:27 PM 02/13/2024    3:05 PM  GAD 7 : Generalized Anxiety Score  Nervous, Anxious, on Edge 3 3 3 3   Control/stop worrying 3 3 3 3   Worry too much - different things 3 3 3 3   Trouble relaxing 3 3 3 3   Restless 3 3 3 3   Easily annoyed or irritable 3 3 3 3   Afraid - awful might happen 3 3 3 3   Total GAD 7 Score 21 21 21 21   Anxiety Difficulty Somewhat difficult Somewhat difficult Extremely difficult Extremely difficult       09/07/2024    2:22 PM 07/09/2024    3:22 PM 04/27/2024    3:26 PM  Depression  screen PHQ 2/9  Decreased Interest 3 3 3   Down, Depressed, Hopeless 3 3 3   PHQ - 2 Score 6 6 6   Altered sleeping 3 3 3   Tired, decreased energy 3 3 3   Change in appetite 3 3 3   Feeling bad or failure about yourself  3 3 3   Trouble concentrating 1 1 1   Moving slowly or fidgety/restless 0 0 0  Suicidal thoughts 0 0 0  PHQ-9 Score 19 19 19   Difficult doing work/chores Very difficult Somewhat difficult Not difficult at all    BP Readings from Last 3 Encounters:  09/07/24 122/68  07/09/24 128/72  06/17/24 109/71    Physical Exam Vitals and nursing note reviewed.  Constitutional:      General: She is not in acute distress.    Appearance: Normal appearance. She is well-developed.  HENT:     Head: Normocephalic and atraumatic.  Cardiovascular:     Rate and Rhythm: Normal rate and regular rhythm.  Pulmonary:     Effort: Pulmonary effort is normal. No respiratory distress.     Breath sounds: No wheezing or rhonchi.  Musculoskeletal:     Cervical back: Spasms and tenderness present. Pain with movement present. Decreased range of motion.  Lymphadenopathy:     Cervical: No cervical adenopathy.  Skin:    General: Skin is warm and dry.     Findings: No rash.  Neurological:     Mental Status: She is alert and oriented to person, place, and time.     Motor: Motor function is intact.     Deep Tendon Reflexes:     Reflex Scores:      Bicep reflexes are 3+ on the right side and 3+ on the left side. Psychiatric:        Mood and Affect: Mood normal.        Behavior: Behavior normal.  Wt Readings from Last 3 Encounters:  09/07/24 164 lb (74.4 kg)  07/09/24 194 lb (88 kg)  04/27/24 184 lb 6 oz (83.6 kg)    BP 122/68   Pulse 77   Ht 5' 3 (1.6 m)   Wt 164 lb (74.4 kg)   SpO2 99%   BMI 29.05 kg/m   Assessment and Plan:  Problem List Items Addressed This Visit       Unprioritized   Neck muscle spasm - Primary   stop flexeril  and use Skelaxin  add Voltaren   bid continue to use heat and pain patches. If no improvement, will get imaging and refer to PTx      Relevant Medications   diclofenac  (VOLTAREN ) 50 MG EC tablet   metaxalone  (SKELAXIN ) 800 MG tablet    No follow-ups on file.    Leita HILARIO Adie, MD [facility] Primary Care and [facility] Medicine [place]

## 2024-09-11 ENCOUNTER — Other Ambulatory Visit: Payer: Self-pay | Admitting: Internal Medicine

## 2024-09-11 DIAGNOSIS — E1169 Type 2 diabetes mellitus with other specified complication: Secondary | ICD-10-CM

## 2024-09-14 NOTE — Telephone Encounter (Signed)
 Requested Prescriptions  Pending Prescriptions Disp Refills   rosuvastatin  (CRESTOR ) 40 MG tablet [Pharmacy Med Name: ROSUVASTATIN  CALCIUM  40 MG TAB] 90 tablet 0    Sig: TAKE 1 TABLET BY MOUTH EVERY DAY     Cardiovascular:  Antilipid - Statins 2 Failed - 09/14/2024 11:17 AM      Failed - Lipid Panel in normal range within the last 12 months    Cholesterol, Total  Date Value Ref Range Status  01/17/2023 227 (H) 100 - 199 mg/dL Final   Cholesterol  Date Value Ref Range Status  12/16/2023 215 (H) 0 - 200 mg/dL Final   LDL Chol Calc (NIH)  Date Value Ref Range Status  01/17/2023 96 0 - 99 mg/dL Final   LDL Cholesterol  Date Value Ref Range Status  12/16/2023 104 (H) 0 - 99 mg/dL Final    Comment:           Total Cholesterol/HDL:CHD Risk Coronary Heart Disease Risk Table                     Men   Women  1/2 Average Risk   3.4   3.3  Average Risk       5.0   4.4  2 X Average Risk   9.6   7.1  3 X Average Risk  23.4   11.0        Use the calculated Patient Ratio above and the CHD Risk Table to determine the patient's CHD Risk.        ATP III CLASSIFICATION (LDL):  <100     mg/dL   Optimal  899-870  mg/dL   Near or Above                    Optimal  130-159  mg/dL   Borderline  839-810  mg/dL   High  >809     mg/dL   Very High Performed at [facility], [address] [place], [place] [address]    LDL Direct  Date Value Ref Range Status  09/02/2019 48 0 - 99 mg/dL Final   HDL  Date Value Ref Range Status  12/16/2023 43 >40 mg/dL Final  97/97/7975 39 (L) >39 mg/dL Final   Triglycerides  Date Value Ref Range Status  12/16/2023 340 (H) <150 mg/dL Final         Passed - Cr in normal range and within 360 days    Creatinine, Ser  Date Value Ref Range Status  12/16/2023 0.91 0.44 - 1.00 mg/dL Final         Passed - Patient is not pregnant      Passed - Valid encounter within last 12 months    Recent Outpatient Visits           1 week ago Neck muscle  spasm   [place] [facility] at [facility], Leita DEL, MD   2 months ago Type II diabetes mellitus with complication [facility])   [place] [facility] at [facility], Leita DEL, MD   4 months ago Internal derangement of left knee   [facility] at [facility], Selinda PARAS, MD   5 months ago Internal derangement of left knee   [facility] at [facility], Selinda PARAS, MD   7 months ago Essential hypertension   [place] [facility] Medicine at [facility], Leita DEL, MD

## 2024-09-24 ENCOUNTER — Other Ambulatory Visit: Payer: Self-pay | Admitting: Internal Medicine

## 2024-09-24 DIAGNOSIS — M62838 Other muscle spasm: Secondary | ICD-10-CM

## 2024-09-27 NOTE — Telephone Encounter (Signed)
 Requested medications are due for refill today.  yes  Requested medications are on the active medications list.  yes  Last refill. 09/07/2024 #40 0 rf  Future visit scheduled.   yes  Notes to clinic.  Expired labs    Requested Prescriptions  Pending Prescriptions Disp Refills   diclofenac  (VOLTAREN ) 50 MG EC tablet [Pharmacy Med Name: DICLOFENAC  SOD EC 50 MG TAB] 40 tablet 0    Sig: TAKE 1 TABLET BY MOUTH TWICE A DAY     Analgesics:  NSAIDS Failed - 09/27/2024  5:59 PM      Failed - Manual Review: Labs are only required if the patient has taken medication for more than 8 weeks.      Failed - HGB in normal range and within 360 days    Hemoglobin  Date Value Ref Range Status  01/17/2023 12.7 11.1 - 15.9 g/dL Final         Failed - PLT in normal range and within 360 days    Platelets  Date Value Ref Range Status  01/17/2023 341 150 - 450 x10E3/uL Final         Failed - HCT in normal range and within 360 days    Hematocrit  Date Value Ref Range Status  01/17/2023 37.8 34.0 - 46.6 % Final         Passed - Cr in normal range and within 360 days    Creatinine, Ser  Date Value Ref Range Status  12/16/2023 0.91 0.44 - 1.00 mg/dL Final         Passed - eGFR is 30 or above and within 360 days    GFR calc Af Amer  Date Value Ref Range Status  09/11/2020 100 >59 mL/min/1.73 Final    Comment:    **[facility] currently reports eGFR in compliance with the current**   recommendations of the [employer]. [facility] will   update reporting as new guidelines are published from the NKF-ASN   Task force.    GFR, Estimated  Date Value Ref Range Status  12/16/2023 >60 >60 mL/min Final    Comment:    (NOTE) Calculated using the CKD-EPI Creatinine Equation (2021)    eGFR  Date Value Ref Range Status  01/17/2023 85 >59 mL/min/1.73 Final         Passed - Patient is not pregnant      Passed - Valid encounter within last 12 months    Recent Outpatient Visits            2 weeks ago Neck muscle spasm   [place] [facility] at [facility], Leita DEL, MD   2 months ago Type II diabetes mellitus with complication [facility])   [place] [facility] at [facility], Leita DEL, MD   5 months ago Internal derangement of left knee   [facility] at [facility], Selinda PARAS, MD   5 months ago Internal derangement of left knee   [facility] at [facility], Selinda PARAS, MD   7 months ago Essential hypertension   [facility] at [facility], Leita DEL, MD

## 2024-09-29 ENCOUNTER — Other Ambulatory Visit: Payer: Self-pay | Admitting: Internal Medicine

## 2024-09-29 DIAGNOSIS — M62838 Other muscle spasm: Secondary | ICD-10-CM

## 2024-10-01 ENCOUNTER — Other Ambulatory Visit: Payer: Self-pay | Admitting: Internal Medicine

## 2024-10-01 DIAGNOSIS — F321 Major depressive disorder, single episode, moderate: Secondary | ICD-10-CM

## 2024-10-01 NOTE — Telephone Encounter (Signed)
 Requested medication (s) are due for refill today: Yes  Requested medication (s) are on the active medication list: Yes  Last refill:  09/07/24  Future visit scheduled: Yes  Notes to clinic:  Not delegated.    Requested Prescriptions  Pending Prescriptions Disp Refills   metaxalone  (SKELAXIN ) 800 MG tablet [Pharmacy Med Name: METAXALONE  800 MG TABLET] 60 tablet 0    Sig: Take 1 tablet (800 mg total) by mouth 3 (three) times daily as needed for muscle spasms.     Not Delegated - Analgesics:  Muscle Relaxants Failed - 10/01/2024 11:21 AM      Failed - This refill cannot be delegated      Passed - Valid encounter within last 6 months    Recent Outpatient Visits           3 weeks ago Neck muscle spasm   [place] [facility] at [facility], Leita DEL, MD   2 months ago Type II diabetes mellitus with complication [facility])   [place] [facility] at [facility], Leita DEL, MD   5 months ago Internal derangement of left knee   [facility] at [facility], Selinda PARAS, MD   5 months ago Internal derangement of left knee   [facility] at [facility], Selinda PARAS, MD   7 months ago Essential hypertension   [facility] at [facility], Leita DEL, MD

## 2024-10-04 NOTE — Telephone Encounter (Signed)
 Requested Prescriptions  Pending Prescriptions Disp Refills   escitalopram  (LEXAPRO ) 20 MG tablet [Pharmacy Med Name: ESCITALOPRAM  20 MG TABLET] 90 tablet 0    Sig: TAKE 1 TABLET BY MOUTH EVERY DAY     Psychiatry:  Antidepressants - SSRI Passed - 10/04/2024  9:30 AM      Passed - Completed PHQ-2 or PHQ-9 in the last 360 days      Passed - Valid encounter within last 6 months    Recent Outpatient Visits           3 weeks ago Neck muscle spasm   [place] [facility] at [facility], Leita DEL, MD   2 months ago Type II diabetes mellitus with complication [facility])   [place] [facility] at [facility], Leita DEL, MD   5 months ago Internal derangement of left knee   [facility] at [facility], Selinda PARAS, MD   5 months ago Internal derangement of left knee   [facility] Medicine at [facility], Selinda PARAS, MD   7 months ago Essential hypertension   [facility] at [facility], Leita DEL, MD

## 2024-10-20 ENCOUNTER — Other Ambulatory Visit: Payer: Self-pay | Admitting: Internal Medicine

## 2024-10-20 DIAGNOSIS — M62838 Other muscle spasm: Secondary | ICD-10-CM

## 2024-10-21 NOTE — Telephone Encounter (Signed)
 Requested medication (s) are due for refill today: yes  Requested medication (s) are on the active medication list: yes  Last refill:  10/01/24  Future visit scheduled: yes  Notes to clinic:  Unable to refill per protocol, cannot delegate.      Requested Prescriptions  Pending Prescriptions Disp Refills   metaxalone  (SKELAXIN ) 800 MG tablet [Pharmacy Med Name: METAXALONE  800 MG TABLET] 60 tablet 0    Sig: TAKE 1 TABLET (800 MG TOTAL) BY MOUTH 3 (THREE) TIMES DAILY AS NEEDED FOR MUSCLE SPASMS.     Not Delegated - Analgesics:  Muscle Relaxants Failed - 10/21/2024  4:30 PM      Failed - This refill cannot be delegated      Passed - Valid encounter within last 6 months    Recent Outpatient Visits           1 month ago Neck muscle spasm   [place] [facility] at [facility], Leita DEL, MD   3 months ago Type II diabetes mellitus with complication [facility])   [place] [facility] at [facility], Leita DEL, MD   5 months ago Internal derangement of left knee   [facility] at [facility], Selinda PARAS, MD   6 months ago Internal derangement of left knee   [facility] at [facility], Selinda PARAS, MD   8 months ago Essential hypertension   [facility] Primary Care & Sports Medicine at [facility], Leita DEL, MD

## 2024-11-16 ENCOUNTER — Other Ambulatory Visit: Payer: Self-pay | Admitting: Internal Medicine

## 2024-11-16 DIAGNOSIS — F324 Major depressive disorder, single episode, in partial remission: Secondary | ICD-10-CM

## 2024-11-17 ENCOUNTER — Encounter: Payer: Self-pay | Admitting: Surgery

## 2024-11-18 NOTE — Telephone Encounter (Signed)
 Requested Prescriptions  Pending Prescriptions Disp Refills   buPROPion  (WELLBUTRIN  XL) 300 MG 24 hr tablet [Pharmacy Med Name: BUPROPION  HCL XL 300 MG TABLET] 90 tablet 0    Sig: TAKE 1 TABLET BY MOUTH EVERY DAY     Psychiatry: Antidepressants - bupropion  Passed - 11/18/2024  4:00 PM      Passed - Cr in normal range and within 360 days    Creatinine, Ser  Date Value Ref Range Status  12/16/2023 0.91 0.44 - 1.00 mg/dL Final         Passed - AST in normal range and within 360 days    AST  Date Value Ref Range Status  12/16/2023 28 15 - 41 U/L Final         Passed - ALT in normal range and within 360 days    ALT  Date Value Ref Range Status  12/16/2023 23 0 - 44 U/L Final         Passed - Completed PHQ-2 or PHQ-9 in the last 360 days      Passed - Last BP in normal range    BP Readings from Last 1 Encounters:  09/07/24 122/68         Passed - Valid encounter within last 6 months    Recent Outpatient Visits           2 months ago Neck muscle spasm   [place] [facility] at [facility], Leita DEL, MD   4 months ago Type II diabetes mellitus with complication [facility])   [place] [facility] at [facility], Leita DEL, MD   6 months ago Internal derangement of left knee   [facility] at [facility], Selinda PARAS, MD   7 months ago Internal derangement of left knee   [facility] Medicine at [facility], Selinda PARAS, MD   9 months ago Essential hypertension   [facility] at [facility], Leita DEL, MD

## 2024-11-19 ENCOUNTER — Encounter: Admitting: Internal Medicine
